# Patient Record
Sex: Female | Born: 1955
Health system: Southern US, Community
[De-identification: ages and names within clinical notes are randomized; demographics above are authoritative.]

## PROBLEM LIST (undated history)

## (undated) DIAGNOSIS — Z8051 Family history of malignant neoplasm of kidney: Secondary | ICD-10-CM

## (undated) DIAGNOSIS — Z803 Family history of malignant neoplasm of breast: Secondary | ICD-10-CM

## (undated) DIAGNOSIS — Z923 Personal history of irradiation: Secondary | ICD-10-CM

## (undated) DIAGNOSIS — E039 Hypothyroidism, unspecified: Secondary | ICD-10-CM

## (undated) DIAGNOSIS — E079 Disorder of thyroid, unspecified: Secondary | ICD-10-CM

## (undated) DIAGNOSIS — T7840XA Allergy, unspecified, initial encounter: Secondary | ICD-10-CM

## (undated) DIAGNOSIS — C50919 Malignant neoplasm of unspecified site of unspecified female breast: Secondary | ICD-10-CM

## (undated) DIAGNOSIS — R011 Cardiac murmur, unspecified: Secondary | ICD-10-CM

## (undated) HISTORY — DX: Allergy, unspecified, initial encounter: T78.40XA

## (undated) HISTORY — DX: Malignant neoplasm of unspecified site of unspecified female breast: C50.919

## (undated) HISTORY — DX: Disorder of thyroid, unspecified: E07.9

## (undated) HISTORY — PX: APPENDECTOMY: SHX54

## (undated) HISTORY — PX: ABDOMINAL HYSTERECTOMY: SHX81

## (undated) HISTORY — PX: NASAL SINUS SURGERY: SHX719

## (undated) HISTORY — PX: OTHER SURGICAL HISTORY: SHX169

## (undated) HISTORY — DX: Family history of malignant neoplasm of kidney: Z80.51

## (undated) HISTORY — PX: TUBAL LIGATION: SHX77

## (undated) HISTORY — DX: Family history of malignant neoplasm of breast: Z80.3

## (undated) HISTORY — PX: TONSILECTOMY, ADENOIDECTOMY, BILATERAL MYRINGOTOMY AND TUBES: SHX2538

---

## 1998-06-06 ENCOUNTER — Other Ambulatory Visit: Admission: RE | Admit: 1998-06-06 | Discharge: 1998-06-06 | Payer: Self-pay | Admitting: Gynecology

## 2000-10-08 ENCOUNTER — Encounter: Admission: RE | Admit: 2000-10-08 | Discharge: 2001-01-06 | Payer: Self-pay | Admitting: Endocrinology

## 2002-04-24 ENCOUNTER — Other Ambulatory Visit: Admission: RE | Admit: 2002-04-24 | Discharge: 2002-04-24 | Payer: Self-pay | Admitting: Gynecology

## 2002-08-17 HISTORY — PX: SHOULDER ARTHROSCOPY: SHX128

## 2002-12-21 ENCOUNTER — Other Ambulatory Visit: Admission: RE | Admit: 2002-12-21 | Discharge: 2002-12-21 | Payer: Self-pay | Admitting: Gynecology

## 2003-08-27 ENCOUNTER — Other Ambulatory Visit: Admission: RE | Admit: 2003-08-27 | Discharge: 2003-08-27 | Payer: Self-pay | Admitting: Gynecology

## 2004-02-13 ENCOUNTER — Observation Stay (HOSPITAL_COMMUNITY): Admission: RE | Admit: 2004-02-13 | Discharge: 2004-02-14 | Payer: Self-pay | Admitting: Gynecology

## 2004-02-13 ENCOUNTER — Encounter (INDEPENDENT_AMBULATORY_CARE_PROVIDER_SITE_OTHER): Payer: Self-pay | Admitting: Specialist

## 2004-10-09 ENCOUNTER — Other Ambulatory Visit: Admission: RE | Admit: 2004-10-09 | Discharge: 2004-10-09 | Payer: Self-pay | Admitting: Gynecology

## 2005-01-29 ENCOUNTER — Ambulatory Visit: Payer: Self-pay | Admitting: Family Medicine

## 2005-05-18 ENCOUNTER — Ambulatory Visit: Payer: Self-pay | Admitting: Family Medicine

## 2005-09-09 ENCOUNTER — Ambulatory Visit: Payer: Self-pay | Admitting: Internal Medicine

## 2005-11-05 ENCOUNTER — Other Ambulatory Visit: Admission: RE | Admit: 2005-11-05 | Discharge: 2005-11-05 | Payer: Self-pay | Admitting: Gynecology

## 2006-02-05 ENCOUNTER — Ambulatory Visit: Payer: Self-pay | Admitting: Family Medicine

## 2006-05-20 ENCOUNTER — Ambulatory Visit: Payer: Self-pay | Admitting: Family Medicine

## 2006-06-02 ENCOUNTER — Ambulatory Visit: Payer: Self-pay | Admitting: Family Medicine

## 2006-10-12 ENCOUNTER — Ambulatory Visit: Payer: Self-pay | Admitting: Family Medicine

## 2006-11-11 ENCOUNTER — Other Ambulatory Visit: Admission: RE | Admit: 2006-11-11 | Discharge: 2006-11-11 | Payer: Self-pay | Admitting: Obstetrics and Gynecology

## 2006-12-28 ENCOUNTER — Ambulatory Visit: Payer: Self-pay | Admitting: Internal Medicine

## 2007-02-21 ENCOUNTER — Ambulatory Visit: Payer: Self-pay | Admitting: Family Medicine

## 2007-02-21 DIAGNOSIS — H60399 Other infective otitis externa, unspecified ear: Secondary | ICD-10-CM | POA: Insufficient documentation

## 2007-02-21 DIAGNOSIS — J309 Allergic rhinitis, unspecified: Secondary | ICD-10-CM | POA: Insufficient documentation

## 2007-02-21 DIAGNOSIS — E039 Hypothyroidism, unspecified: Secondary | ICD-10-CM | POA: Insufficient documentation

## 2007-02-21 DIAGNOSIS — E109 Type 1 diabetes mellitus without complications: Secondary | ICD-10-CM | POA: Insufficient documentation

## 2007-02-21 DIAGNOSIS — J45909 Unspecified asthma, uncomplicated: Secondary | ICD-10-CM | POA: Insufficient documentation

## 2007-03-02 ENCOUNTER — Telehealth (INDEPENDENT_AMBULATORY_CARE_PROVIDER_SITE_OTHER): Payer: Self-pay | Admitting: *Deleted

## 2007-09-20 ENCOUNTER — Ambulatory Visit: Payer: Self-pay | Admitting: Internal Medicine

## 2007-09-20 DIAGNOSIS — R300 Dysuria: Secondary | ICD-10-CM | POA: Insufficient documentation

## 2007-09-20 LAB — CONVERTED CEMR LAB
Bilirubin Urine: NEGATIVE
Blood in Urine, dipstick: NEGATIVE
Glucose, Urine, Semiquant: NEGATIVE
Nitrite: NEGATIVE
Protein, U semiquant: NEGATIVE
Specific Gravity, Urine: <1.005
Urobilinogen, UA: NEGATIVE
WBC Urine, dipstick: NEGATIVE
pH: 7.5

## 2007-09-21 ENCOUNTER — Encounter: Payer: Self-pay | Admitting: Internal Medicine

## 2007-09-22 ENCOUNTER — Telehealth (INDEPENDENT_AMBULATORY_CARE_PROVIDER_SITE_OTHER): Payer: Self-pay | Admitting: *Deleted

## 2007-11-14 ENCOUNTER — Other Ambulatory Visit: Admission: RE | Admit: 2007-11-14 | Discharge: 2007-11-14 | Payer: Self-pay | Admitting: Obstetrics and Gynecology

## 2008-03-09 ENCOUNTER — Encounter: Payer: Self-pay | Admitting: Family Medicine

## 2008-11-14 ENCOUNTER — Other Ambulatory Visit: Admission: RE | Admit: 2008-11-14 | Discharge: 2008-11-14 | Payer: Self-pay | Admitting: Obstetrics and Gynecology

## 2009-09-30 DIAGNOSIS — J45909 Unspecified asthma, uncomplicated: Secondary | ICD-10-CM | POA: Insufficient documentation

## 2009-11-20 ENCOUNTER — Other Ambulatory Visit: Admission: RE | Admit: 2009-11-20 | Discharge: 2009-11-20 | Payer: Self-pay | Admitting: Obstetrics and Gynecology

## 2010-02-06 ENCOUNTER — Ambulatory Visit: Payer: Self-pay | Admitting: Family Medicine

## 2010-02-06 DIAGNOSIS — B373 Candidiasis of vulva and vagina: Secondary | ICD-10-CM | POA: Insufficient documentation

## 2010-02-06 DIAGNOSIS — N760 Acute vaginitis: Secondary | ICD-10-CM | POA: Insufficient documentation

## 2010-02-06 LAB — CONVERTED CEMR LAB
Bilirubin Urine: NEGATIVE
Blood in Urine, dipstick: NEGATIVE
Glucose, Urine, Semiquant: NEGATIVE
KOH Prep: NEGATIVE
Ketones, urine, test strip: NEGATIVE
Nitrite: NEGATIVE
Protein, U semiquant: NEGATIVE
Specific Gravity, Urine: 1.015
Urobilinogen, UA: 0.2
WBC Urine, dipstick: NEGATIVE
Whiff Test: NEGATIVE
pH: 6.5

## 2010-05-01 ENCOUNTER — Encounter: Admission: RE | Admit: 2010-05-01 | Discharge: 2010-05-01 | Payer: Self-pay | Admitting: Orthopedic Surgery

## 2010-09-18 NOTE — Assessment & Plan Note (Signed)
Summary: left ear ache/alj   Vital Signs:  Patient Profile:   55 Years Old Female Weight:      130.4 pounds Temp:     97.8 degrees F oral Pulse rate:   72 / minute BP sitting:   118 / 70  (left arm)               PCP:  [facility]  Chief Complaint:  LEFT EARACHE SINCE FRIDAY (02/18/2007/) and Ear pain.  History of Present Illness:  Ear Pain      This is a 55 year old woman who presents with Ear pain.  Pt here c/o L ear pain since Friday.  The patient denies ear discharge, sensation of fullness, hearing loss, tinnitus, fever, sinus pain, nasal discharge, and jaw click.  The pain is located in the left ear.  The pain is described as constant.  Associated symptoms include headache and night grinding of teeth.  The patient denies toothache, dizziness, and vertigo.      Past Medical History:    Allergic rhinitis    Asthma    Diabetes mellitus, type I    Hypothyroidism     Review of Systems      See HPI  General      Denies chills, fatigue, fever, loss of appetite, malaise, sleep disorder, sweats, weakness, and weight loss.  ENT      Complains of earache.      Denies decreased hearing, difficulty swallowing, ear discharge, hoarseness, nasal congestion, nosebleeds, postnasal drainage, ringing in ears, sinus pressure, and sore throat.  CV      Denies bluish discoloration of lips or nails, chest pain or discomfort, difficulty breathing at night, difficulty breathing while lying down, fainting, fatigue, leg cramps with exertion, lightheadness, near fainting, palpitations, shortness of breath with exertion, swelling of feet, swelling of hands, and weight gain.  Resp      Denies chest discomfort, chest pain with inspiration, cough, coughing up blood, excessive snoring, hypersomnolence, morning headaches, pleuritic, shortness of breath, sputum productive, and wheezing.   Physical Exam  General:     Well-developed,well-nourished,in no acute distress; alert,appropriate and  cooperative throughout examination Ears:     L ear canal red  TM clear R ear normal Lungs:     Normal respiratory effort, chest expands symmetrically. Lungs are clear to auscultation, no crackles or wheezes. Heart:     normal rate, regular rhythm, and no murmur.      Impression & Recommendations:  Problem # 1:  EXTERNAL OTITIS (ICD-380.10) Discussed symptomatic treatment and preventive measures.  Her updated medication list for this problem includes:    Floxin Otic Singles 0.3 % Soln (Ofloxacin) .Marland KitchenMarland KitchenMarland KitchenMarland Kitchen 10 gtts in left ear once daily for 7- 10 days RTo prn  Medications Added to Medication List This Visit: 1)  Adult Aspirin Ec Low Strength 81 Mg Tbec (Aspirin) .[name].. 1 once daily 2)  Synthroid 150 Mcg Tabs (Levothyroxine sodium) .[name].. 1 once daily 3)  Vivelle-dot 0.075 Mg/24hr Pttw (Estradiol) 4)  Flonase 50 Mcg/act Susp (Fluticasone propionate) .... As needed 5)  Albuterol 90 Mcg/act Aers (Albuterol) .... As needed 6)  Novolog Pump  7)  Floxin Otic Singles 0.3 % Soln (Ofloxacin) .[name].. 10 gtts in left ear once daily for 7- 10 days     Prescriptions: FLOXIN OTIC SINGLES 0.3 %  SOLN (OFLOXACIN) 10 gtts in Left ear once daily for 7- 10 days  #10 days x 0   Entered and Authorized by:   Loreen Freud  DO   Signed by:   Loreen Freud DO on 02/21/2007   Method used:   Print then Give to Patient   RxID:   [id number]

## 2010-09-18 NOTE — Progress Notes (Signed)
Summary: not better-EAR PAIN  Phone Note Call from Patient   Caller: Patient Reason for Call: Acute Illness, Talk to Nurse Summary of Call: dr. Laury Axon [phone] [facility] pt was seen 7.7 for an ear ache. she is still having problems she was told to call and see if she can get an axb called into the pharmacy  Initial call taken by: Charolette Child,  March 02, 2007 10:56 AM  Follow-up for Phone Call        PT SAYS SHE WAS TOLD TO CALL IF KNOW BETTER SHE IS STILL EXPERIENCING PRBLEMS W/ HER EAR. CDAVIS  Additional Follow-up for Phone Call Additional follow up Details #1::        Augmentin 875 two times a day  for 10 days  #20 Additional Follow-up by: Loreen Freud DO,  March 02, 2007 12:00 PM    New/Updated Medications: AUGMENTIN 875-125 MG  TABS (AMOXICILLIN-POT CLAVULANATE) TWO TIME DAILY A DAY FOR 10 DAY  Prescriptions: AUGMENTIN 875-125 MG  TABS (AMOXICILLIN-POT CLAVULANATE) TWO TIME DAILY A DAY FOR 10 DAY  #20 x 0   Entered by:   Daine Gip   Authorized by:   Loreen Freud DO   Signed by:   Daine Gip on 03/02/2007   Method used:   Print then Give to Patient   RxID:   [id number]

## 2010-09-18 NOTE — Progress Notes (Signed)
Summary: PHONE CALL: CULTURE RESULTS  Phone Note Outgoing Call   Call placed by: Shonna Chock,  September 22, 2007 2:23 PM Call placed to: Patient Details for Reason: DISCUSS CULTURE Summary of Call: LEFT MESSAGE ON MACHINE, REASON FOR CALL WAS TO TELL PATIENT HER CULTURE WAS NEG, DR.HOPPER RECOMMENDS DRINKING PLENTY OF FLUIDS AND MEDICATION PRESCRIBED FOR UTI CAN BE STOPPED. ..................................................................Marland KitchenChrae Malloy  September 22, 2007 2:25 PM   Follow-up for Phone Call        LEFT MESSAGE ON MACHINE Follow-up by: Shonna Chock,  September 23, 2007 12:33 PM  Additional Follow-up for Phone Call Additional follow up Details #1::        D/W PATIENT, PATIENT OK'D INSTRUCTION. Additional Follow-up by: Shonna Chock,  September 23, 2007 4:56 PM

## 2010-09-18 NOTE — Assessment & Plan Note (Signed)
Summary: acute only:uti//alj   Vital Signs:  Patient Profile:   55 Years Old Female Weight:      132.13 pounds Temp:     97.0 degrees F oral Pulse rate:   58 / minute Pulse rhythm:   regular Resp:     12 per minute BP sitting:   122 / 62  (left arm) Cuff size:   large  Pt. in pain?   no  Vitals Entered By: Wendall Stade (September 20, 2007 3:02 PM)                  PCP:  Laury Axon  Chief Complaint:  painful urination and Dysuria.  History of Present Illness: pain with urination  Dysuria; Rx: OTC Azo with minimal benefit      This is a 55 year old woman who presents with Dysuria.  PMH TAH & BSO for abnormal PAP. She sees Dr Evlyn Kanner; A1c was 6.8 % in 1/09.  The patient reports burning with urination and urinary frequency, but denies urgency, hematuria, vaginal discharge, vaginal itching, and vaginal sores.  Associated symptoms include pelvic pain.  The patient denies the following associated symptoms: nausea, vomiting, fever, shaking chills, flank pain, abdominal pain, back pain, and arthralgias.  Risk factors for urinary tract infection include diabetes.  The patient denies the following risk factors: prior antibiotics, immunosuppression, history of GU anomaly, history of pyelonephritis, and analgesic abuse.    Current Allergies (reviewed today): No known allergies       Physical Exam  General:     Well-developed,well-nourished,in no acute distress; alert,appropriate and cooperative throughout examination Mouth:     Oral mucosa and oropharynx without lesions or exudates.  Teeth in good repair. Abdomen:     Bowel sounds positive,abdomen soft and non-tender without masses, organomegaly or hernias noted. Insulin pump in place. Minimal discomfort to percussion L flank Skin:     Intact without suspicious lesions or rashes. No tenting    Impression & Recommendations:  Problem # 1:  DYSURIA (ICD-788.1)  Orders: UA Dipstick w/o Micro (32440) T-Culture, Urine  ([id number])  Her updated medication list for this problem includes:    Macrobid 100 Mg Caps (Nitrofurantoin monohyd macro) .[name]... 1 two times a day    Phenazopyridine Hcl 200 Mg Tabs (Phenazopyridine hcl) .[name]... 1 three times a day prn   Problem # 2:  DIABETES MELLITUS, TYPE I (ICD-250.01)  Her updated medication list for this problem includes:    Adult Aspirin Ec Low Strength 81 Mg Tbec (Aspirin) .[name]... 1 once daily   Complete Medication List: 1)  Adult Aspirin Ec Low Strength 81 Mg Tbec (Aspirin) .[name].. 1 once daily 2)  Synthroid 150 Mcg Tabs (Levothyroxine sodium) .[name].. 1 once daily 3)  Vivelle-dot 0.075 Mg/24hr Pttw (Estradiol) 4)  Novolog Pump  5)  Macrobid 100 Mg Caps (Nitrofurantoin monohyd macro) .[name].. 1 two times a day 6)  Phenazopyridine Hcl 200 Mg Tabs (Phenazopyridine hcl) .[name].. 1 three times a day prn   Patient Instructions: 1)  Drink as much fluid as you can tolerate for the next few days.    Prescriptions: PHENAZOPYRIDINE HCL 200 MG  TABS (PHENAZOPYRIDINE HCL) 1 three times a day prn  #15 x 0   Entered and Authorized by:   Marga Melnick MD   Signed by:   Marga Melnick MD on 09/20/2007   Method used:   Print then Give to Patient   RxID:   [id number] MACROBID 100 MG  CAPS (NITROFURANTOIN MONOHYD MACRO) 1  two times a day  #20 x 0   Entered and Authorized by:   Marga Melnick MD   Signed by:   Marga Melnick MD on 09/20/2007   Method used:   Print then Give to Patient   RxID:   [id number]  ] Laboratory Results   Urine Tests  Date/Time Recieved: September 20, 2007 3:10 PM   Routine Urinalysis   Color: straw Appearance: Clear Glucose: negative   (Normal Range: Negative) Bilirubin: negative   (Normal Range: Negative) Ketone: moderate (40)   (Normal Range: Negative) Spec. Gravity: <1.005   (Normal Range: 1.003-1.035) Blood: negative   (Normal Range: Negative) pH: 7.5   (Normal Range: 5.0-8.0) Protein: negative   (Normal Range:  Negative) Urobilinogen: negative   (Normal Range: 0-1) Nitrite: negative   (Normal Range: Negative) Leukocyte Esterace: negative   (Normal Range: Negative)    Comments: dysuria sent for culture 599.0

## 2010-09-18 NOTE — Assessment & Plan Note (Signed)
Summary: YEAST INF--OTC NOT WORKING---OK PER DANIELLE////SPH   Vital Signs:  Patient profile:   55 year old female Height:      64 inches Weight:      118.25 pounds BMI:     20.37 Pulse rate:   76 / minute Pulse rhythm:   regular BP sitting:   118 / 80  (left arm) Cuff size:   regular  Vitals Entered By: Army Fossa CMA (February 06, 2010 11:26 AM) CC: Pt here for possible yeast infection off and on for 1 month- has tried Engineer, materials not working. No discharge. Burns after urination, Vaginal discharge   History of Present Illness:  Vaginal discharge      This is a 55 year old woman who presents with Vaginal discharge.  Pt actually did not see d/c--- c/o vaginal burning and itchint.  The patient complains of itching and burning on urination, but denies vaginal burning, frequency, urgency, fever, pelvic pain, and back pain.  The discharge is described as white.  Risk factors for vaginal discharge include diabetes.  The patient denies the following symptoms: genital sores, unusual vaginal bleeding, painful intercourse, rash, myalgias, arthralgias, and headache.  Prior treatment has included OTC antifungals.    Current Medications (verified): 1)  Adult Aspirin Ec Low Strength 81 Mg  Tbec (Aspirin) .[name].. 1 Once Daily 2)  Synthroid 150 Mcg  Tabs (Levothyroxine Sodium) .[name].. 1 Once Daily 3)  Vivelle-Dot 0.075 Mg/24hr  Pttw (Estradiol) 4)  Novolog Pump 5)  Claritin 10 Mg Tabs (Loratadine) 6)  Zyrtec Hives Relief 10 Mg Tabs (Cetirizine Hcl) 7)  Fluconazole 150 Mg Tabs (Fluconazole) .[name].. 1 By Mouth Once Daily X1--May Repeat in 3 Days As Needed 8)  Metrogel-Vaginal 0.75 % Gel (Metronidazole) .[name].. 1 Applicator Pv At Bedtime For 5 Nights  Allergies (verified): No Known Drug Allergies  Past History:  Past medical, surgical, family and social histories (including risk factors) reviewed for relevance to current acute and chronic problems.  Past Medical History: Reviewed history from 02/21/2007 and  no changes required. Allergic rhinitis Asthma Diabetes mellitus, type I Hypothyroidism  Family History: Reviewed history and no changes required.  Social History: Reviewed history and no changes required.  Review of Systems      See HPI  Physical Exam  General:  Well-developed,well-nourished,in no acute distress; alert,appropriate and cooperative throughout examination Abdomen:  Bowel sounds positive,abdomen soft and non-tender without masses, organomegaly or hernias noted. Genitalia:  normal introitus, no external lesions, no vaginal or cervical lesions, and vaginal discharge.   no odor thin white d/c Psych:  Oriented X3 and normally interactive.     Impression & Recommendations:  Problem # 1:  VAGINITIS, BACTERIAL (ICD-616.10)  The following medications were removed from the medication list:    Macrobid 100 Mg Caps (Nitrofurantoin monohyd macro) .[name]... 1 two times a day Her updated medication list for this problem includes:    Metrogel-vaginal 0.75 % Gel (Metronidazole) .[name]... 1 applicator pv at bedtime for 5 nights  Discussed symptomatic relief and treatment options.   Orders: Wet Prep ([id number])  Problem # 2:  Hx of VAGINITIS, CANDIDAL (ICD-112.1)  Her updated medication list for this problem includes:    Fluconazole 150 Mg Tabs (Fluconazole) .[name]... 1 by mouth once daily x1--may repeat in 3 days as needed  Discussed treatment regimen and preventive measures.   Orders: Wet Prep ([id number])  Complete Medication List: 1)  Adult Aspirin Ec Low Strength 81 Mg Tbec (Aspirin) .[name].. 1 once daily 2)  Synthroid 150  Mcg Tabs (Levothyroxine sodium) .[name].. 1 once daily 3)  Vivelle-dot 0.075 Mg/24hr Pttw (Estradiol) 4)  Novolog Pump  5)  Claritin 10 Mg Tabs (Loratadine) 6)  Zyrtec Hives Relief 10 Mg Tabs (Cetirizine hcl) 7)  Fluconazole 150 Mg Tabs (Fluconazole) .[name].. 1 by mouth once daily x1--may repeat in 3 days as needed 8)  Metrogel-vaginal 0.75 % Gel (Metronidazole) .[name].. 1  applicator pv at bedtime for 5 nights  Other Orders: UA Dipstick w/o Micro (manual) (01027) Prescriptions: METROGEL-VAGINAL 0.75 % GEL (METRONIDAZOLE) 1 applicator pv at bedtime for 5 nights  #5 x 0   Entered and Authorized by:   Loreen Freud DO   Signed by:   Loreen Freud DO on 02/06/2010   Method used:   Electronically to        [facility] [phone]* (retail)       [address] [place], [place]  [address]       Ph: [phone] or [phone]       Fax: [phone]   RxID:   [id number] FLUCONAZOLE 150 MG TABS (FLUCONAZOLE) 1 by mouth once daily x1--may repeat in 3 days as needed  #2 x 2   Entered and Authorized by:   Loreen Freud DO   Signed by:   Loreen Freud DO on 02/06/2010   Method used:   Print then Give to Patient   RxID:   [id number]   Laboratory Results   Urine Tests    Routine Urinalysis   Color: yellow Appearance: Clear Glucose: negative   (Normal Range: Negative) Bilirubin: negative   (Normal Range: Negative) Ketone: negative   (Normal Range: Negative) Spec. Gravity: 1.015   (Normal Range: 1.003-1.035) Blood: negative   (Normal Range: Negative) pH: 6.5   (Normal Range: 5.0-8.0) Protein: negative   (Normal Range: Negative) Urobilinogen: 0.2   (Normal Range: 0-1) Nitrite: negative   (Normal Range: Negative) Leukocyte Esterace: negative   (Normal Range: Negative)    Comments: Army Fossa CMA  February 06, 2010 11:53 AM    Wet [place] Source: vaginal  WBC/hpf: 5-10 Bacteria/hpf: 2+ Clue cells/hpf: few  Negative whiff Yeast/hpf: none Wet [place] KOH: Negative Trichomonas/hpf: none

## 2010-09-18 NOTE — Procedures (Signed)
Summary: Colonoscopy/[facility]  Colonoscopy/[facility]   Imported By: Lanelle Bal 03/22/2008 12:59:00  _____________________________________________________________________  External Attachment:    Type:   Image     Comment:   External Document

## 2011-01-02 NOTE — Op Note (Signed)
NAME:  Tiffany Browning, Tiffany Browning                        ACCOUNT NO.:  [id number]   MEDICAL RECORD NO.:  [id number]                   PATIENT TYPE:  OBV   LOCATION:  0623                                 FACILITY:  [facility]   PHYSICIAN:  Leona Singleton, M.D.                  DATE OF BIRTH:  [birth year]   DATE OF PROCEDURE:  02/13/2004  DATE OF DISCHARGE:                                 OPERATIVE REPORT   PREOPERATIVE DIAGNOSIS:  Left ovarian cyst.   POSTOPERATIVE DIAGNOSIS:  Left ovarian cyst plus adhesions.   OPERATION PERFORMED:  Super cervical hysterectomy with bilateral salpingo-  oophorectomy and lysis of adhesions.   SURGEON:  Leatha Gilding. Mezer, M.D.   ASSISTANT:  Leona Singleton, M.D.   STAND-BY SURGEON:  De Blanch, M.D.   ANESTHESIA:  General endotracheal.   PREPARATION:  Betadine.   PROCEDURE:  With the patient in the supine position, he was prepped and  draped in a routine fashion.  A Pfannenstiel incision was made through the  skin and subcutaneous tissue.  The fascia and peritoneum were opened without  difficulty.  There was significant scarring of the rectus muscle to the  posterior fascia and very careful attention was paid to hemostasis in both  entering and exiting the abdomen.  A brief exploration of her upper abdomen  was benign.  There were adhesions of omentum to the anterior abdominal wall,  at the superior aspect of the incision, and these adhesions were taken down  with cautery and hemostasis carefully checked.  Attention was then directed  to the pelvis.  Pelvic washings were obtained upon entering the peritoneal  cavity, which were later discarded after the benign pathology report.  The  uterus was top-normal in size.  The left ovary contained an approximately 5  cm cyst.  The right ovary was normal.  There were adhesions of bowel  extending from the left round ligament to the left infundibulopelvic  ligament.  These adhesions were quite dense and very  vascular.  Normal  anatomy was restored by lysis of the adhesions just mentioned and taken down  in layers, using cautery and scissors, as indicated.  Careful attention to  hemostasis was paid.  The ureter was identified and taken out of harm's way.  The round ligaments were suture-ligated with #1 chromic and divided.  The  anterior leaflet of the broad ligament was opened, and the bladder taken  down.  The bladder was reasonably densely adherent to the lower part of the  uterus and cervix, and this was primarily done with scissors and no apparent  injury.  The infundibulopelvic ligament was isolated on the left.  The  ureter re identified.  The pedicle clamped, cut, and freed tied with #1  chromic and suture-ligated with #1 chromic.  The tube and ovary were then  separated from the uterus and then sent to pathology for frozen section  analysis.  A verbal report of the frozen section revealed a benign ovarian  cyst and a probable endometrioma.  The right infundibulopelvic ligament was  clamped, cut, and free-tied with a #1 chromic and suture-ligated with a #1  chromic.  The uterine artery and veins were then clamped, cut, and suture-  ligated with #1 chromic.  The cardinal ligament was taken and separately  clamped, cut, and suture-ligated with #1 chromic.  The uterus was then  amputated from the cervix with cautery.  The base of the cervix was  cauterized.  The endocervical canal cauterized.  The stomach was then closed  over with interrupted figure-of-eight #1 chromic sutures.  Care was taken to  avoid the bladder.  There was some oozing at the base of the bladder, which  was arrested with very careful cautery.  The pelvis was irrigated.  Hemostasis noted to be intact, and the bladder was then placed over the  cervical stump with a running 3-0 Vicryl suture.  The pelvis was rewashed.  Hemostasis again noted to be intact.  Both the ureters were re identified  and found to be out of harm's  way, peristalsing and not dilated.  At the  completion of the procedure, an effort was made to place the large bowel in  the cul de sac.  The omentum was brought down.  The abdomen was closed in  layers using a running 2-0 Vicryl on the peritoneum.  Again, careful  attention was paid to hemostasis in the subfascial area.  The fascia was  closed with running 0 Vicryl to midline bilaterally.  Hemostasis was assured  in the subcutaneous tissue, and the skin was closed with staples.  The  estimated blood loss was approximately 100 cc.  The sponge, instrument, and  needle counts were correct x2.  Patient tolerated the procedure well and was  taken to the recovery room in satisfactory condition.                                               Leona Singleton, M.D.    KB/MEDQ  D:  02/13/2004  T:  02/13/2004  Job:  [id number]   cc:   Jeannett Senior A. Evlyn Kanner, M.D.  [address]  [place]  [place] [address]  Fax: [phone]   De Blanch, M.D.

## 2011-01-02 NOTE — H&P (Signed)
NAME:  Tiffany Browning, Tiffany Browning                        ACCOUNT NO.:  [id number]   MEDICAL RECORD NO.:  [id number]                   PATIENT TYPE:  AMB   LOCATION:  DAY                                  FACILITY:  [facility]   PHYSICIAN:  Howard C. Mezer, M.D.               DATE OF BIRTH:  1956/07/26   DATE OF ADMISSION:  02/13/2004  DATE OF DISCHARGE:                                HISTORY & PHYSICAL   ADMITTING DIAGNOSIS:  Left ovarian cyst.   HISTORY OF PRESENT ILLNESS:  The patient is a 55 year old nulligravida  female admitted with a persistent, somewhat complex left ovarian cyst for  exploratory laparotomy and total abdominal hysterectomy and bilateral  salpingo-oophorectomy.  The patient underwent a CT scan at the direction of  Dr. Adrian Prince for lower leg edema, which has apparently resolved, in  December of 2004.  At that time, there was a right adnexal cystic structure,  most likely representing an ovarian cyst, and pelvic ultrasound was  suggested.  After multiple attempts in rescheduling by the patient,  ultrasound examination was finally performed on December 13, 2003.  At that  time, a left ovarian cyst measuring 58.5 x 43.7 mm with some complexity was  found.  A CA125 was obtained, which was returned as 9.0, and the patient was  placed on torsion precautions.  The ultrasound examination was repeated on  January 22, 2004, at which time the cyst appeared to be larger, at 63.7 x 47.1  mm, and the complexity of the cyst appeared to have increased to some  degree.  The need for exploratory laparotomy to rule out cancer was  discussed with the patient and her husband.  Exploratory laparotomy was  scheduled for February 13, 2004.  At the preoperative visit on February 07, 2004,  the ultrasound revealed the cyst to be somewhat smaller, measuring 54.0 x  38.5 mm, and the cyst appeared to be much more simple by ultrasound  evaluation.  At this point, the patient was encouraged to consider delaying  the  surgery to allow further observation of this cyst, given the possibility  that this might resolve spontaneously, and the surgery not be required.  The  patient was asked that if a complication of problem occurred during the  surgery, and the cyst was benign, would the patient still feel that the  surgery was the correct choice.  The patient strongly wished to proceed with  surgery, even though it appeared at this point that the cyst is more likely  to be benign, and could resolve without surgery.  The patient understands  that her risks of the surgery are increased secondary to her diabetes.  The  patient was seen at 2 different times during the day of the preoperative  examination, to give her time to carefully consider the choices, and after  careful reflection and consultation with family members, she has decided to  proceed with  the surgery, and excepts all the risks that are outlined in the  history and physical.  Exploratory laparotomy with total abdominal  hysterectomy and bilateral salpingo-oophorectomy were discussed with the  patient in detail.  The patient felt strongly that she wanted to have the  uterus and both ovaries removed to reduce the chance for problems in the  future.  The possibility of leaving the cervix and possibly decreasing the  chance for a postoperative infection was discussed.  Dr. Katheren Shams-  Pearson will be available at a standby surgery if cancer is encountered, and  he will perform the indicated procedures.   The potential complications including, but not limited to, anesthesia,  injury to the bowel, bladder, ureters, possible fistula formation, possible  blood loss with transfusion and its sequelae, and possible infection in the  pelvis or the wound have been discussed.  Several times during the  discussion, the increased risks secondary to the diabetes were reviewed.  The patient understands that with hysterectomy that she will have no further   menstrual periods, and that she will have permanent sterilization.  Postoperative expectations and restrictions have been reviewed in detail.  Pain control has been reviewed.  Bowel prep with GoLYTELY has been  discussed, and the patient will contact Dr. Evlyn Kanner to discuss her insulin  management during the preoperative time.   The patient appears to clearly understands the risks and benefits and  alternatives of the proposed surgery, and wishes to proceed as above.   PAST MEDICAL HISTORY:  1. Surgical T&A.  2. Appendectomy.  3. Tubal ligation.  4. Microscopic tubal anastomosis.  5. Ovarian cystectomy.  6. Nose.   MEDICAL:  Diabetes and hypothyroid.   MEDICATIONS:  1. Synthroid.  2. Pravachol.  3. Zetia.  4. Captopril.  5. Insulin pump.   ALLERGIES:  None known.   SOCIAL HISTORY:  Smokes - none.  ETOH - occasional.  The patient is married,  and is employed at [employer].   FAMILY HISTORY:  Positive for a question of a cancer in the patient's  mother, possibly uterine, possibly ovarian, and the patient states that to  her knowledge there was no postoperative treatment, raising the possibility  of a pre-malignant lesion.   PHYSICAL EXAMINATION:  HEENT:  Negative.  LUNGS:  Clear.  HEART:  Without murmurs.  BREASTS:  Without masses or discharge.  ABDOMEN:  Soft and nontender.  PELVIC:  Cervix and vagina normal.  The uterus is anterior, normal in size.  The right adnexa is negative.  The left ovary contains a cystic mass, and is  in the cul-de-sac.  It appears to be mobile and without nodularity on  examination.  EXTREMITIES:  Negative.   Repeat CA125 returned at 10.8, with an intermittent level of 13.4 on January 22, 2004.  The patient has been informed of the minor decrease in the CA125 that  was drawn preoperatively.   IMPRESSION:  Left ovarian cyst.   PLAN:  Exploratory laparotomy with hysterectomy and bilateral salpingo-  oophorectomy.                                               Leatha Gilding. Mezer, M.D.    HCM/MEDQ  D:  02/12/2004  T:  02/12/2004  Job:  [id number]   cc:   Jeannett Senior A. Evlyn Kanner, M.D.  [address]  [place]  [place] [address]  Fax: [phone]   [place], M.D.

## 2011-01-26 ENCOUNTER — Ambulatory Visit (INDEPENDENT_AMBULATORY_CARE_PROVIDER_SITE_OTHER): Payer: BC Managed Care – PPO | Admitting: Family Medicine

## 2011-01-26 ENCOUNTER — Encounter: Payer: Self-pay | Admitting: Family Medicine

## 2011-01-26 VITALS — BP 144/74 | HR 60 | Temp 98.1°F | Ht 61.25 in | Wt 126.6 lb

## 2011-01-26 DIAGNOSIS — Z136 Encounter for screening for cardiovascular disorders: Secondary | ICD-10-CM

## 2011-01-26 DIAGNOSIS — Z0000 Encounter for general adult medical examination without abnormal findings: Secondary | ICD-10-CM

## 2011-01-26 LAB — CBC WITH DIFFERENTIAL/PLATELET
Basophils Absolute: 0.1 10*3/uL (ref 0.0–0.1)
Basophils Relative: 1.5 % (ref 0.0–3.0)
Eosinophils Absolute: 0.3 10*3/uL (ref 0.0–0.7)
Eosinophils Relative: 7.2 % — ABNORMAL HIGH (ref 0.0–5.0)
HCT: 36.3 % (ref 36.0–46.0)
Hemoglobin: 12.4 g/dL (ref 12.0–15.0)
Lymphocytes Relative: 30.5 % (ref 12.0–46.0)
Lymphs Abs: 1.4 10*3/uL (ref 0.7–4.0)
MCHC: 34.2 g/dL (ref 30.0–36.0)
MCV: 95.6 fl (ref 78.0–100.0)
Monocytes Absolute: 0.5 10*3/uL (ref 0.1–1.0)
Monocytes Relative: 10.9 % (ref 3.0–12.0)
Neutro Abs: 2.3 10*3/uL (ref 1.4–7.7)
Neutrophils Relative %: 49.9 % (ref 43.0–77.0)
Platelets: 300 10*3/uL (ref 150.0–400.0)
RBC: 3.8 Mil/uL — ABNORMAL LOW (ref 3.87–5.11)
RDW: 13 % (ref 11.5–14.6)
WBC: 4.6 10*3/uL (ref 4.5–10.5)

## 2011-01-26 LAB — POCT URINALYSIS DIPSTICK
Bilirubin, UA: NEGATIVE
Blood, UA: NEGATIVE
Glucose, UA: NEGATIVE
Ketones, UA: NEGATIVE
Leukocytes, UA: NEGATIVE
Nitrite, UA: NEGATIVE
Protein, UA: NEGATIVE
Spec Grav, UA: 1.005
Urobilinogen, UA: 0.2
pH, UA: 7

## 2011-01-26 NOTE — Patient Instructions (Signed)

## 2011-01-26 NOTE — Progress Notes (Signed)
  Subjective:     Tiffany Browning is a 55 y.o. female and is here for a comprehensive physical exam. The patient reports no problems.  History   Social History  . Marital Status: Married    Spouse Name: N/A    Number of Children: N/A  . Years of Education: N/A   Occupational History  . 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 Smoking status: Never Smoker   . Smokeless tobacco: Never Used  . Alcohol Use: No  . Drug Use: No  . Sexually Active: Yes -- Female partner(s)   Other Topics Concern  . Not on file   Social History Narrative  . No narrative on file   Health Maintenance  Topic Date Due  . Tetanus/tdap  05/29/1976  . Influenza Vaccine  05/18/2011  . Mammogram  09/27/2012  . Pap Smear  11/25/2013  . Colonoscopy  02/24/2018    The following portions of the patient's history were reviewed and updated as appropriate: allergies, current medications, past family history, past medical history, past social history, past surgical history and problem list.  Review of Systems Review of Systems  Constitutional: Negative for activity change, appetite change and fatigue.  HENT: Negative for hearing loss, congestion, tinnitus and ear discharge.  dentist q75m Eyes: Negative for visual disturbance (see optho q1y -- vision corrected to 20/20 with glasses).  Respiratory: Negative for cough, chest tightness and shortness of breath.   Cardiovascular: Negative for chest pain, palpitations and leg swelling.  Gastrointestinal: Negative for abdominal pain, diarrhea, constipation and abdominal distention.  Genitourinary: Negative for urgency, frequency, decreased urine volume and difficulty urinating.  Musculoskeletal: Negative for back pain, arthralgias and gait problem.  Skin: Negative for color change, pallor and rash.  Neurological: Negative for dizziness, light-headedness, numbness and headaches.  Hematological: Negative for adenopathy. Does not bruise/bleed  easily.  Psychiatric/Behavioral: Negative for suicidal ideas, confusion, sleep disturbance, self-injury, dysphoric mood, decreased concentration and agitation.       Objective:    BP 144/74  Pulse 60  Temp(Src) 98.1 F (36.7 C) (Oral)  Ht 5' 1.25" (1.556 m)  Wt 126 lb 9.6 oz (57.425 kg)  BMI 23.73 kg/m2  SpO2 97% General appearance: alert, cooperative, appears stated age and no distress Head: Normocephalic, without obvious abnormality, atraumatic Eyes: conjunctivae/corneas clear. PERRL, EOM's intact. Fundi benign. Ears: normal TM's and external ear canals both ears Nose: Nares normal. Septum midline. Mucosa normal. No drainage or sinus tenderness. Throat: lips, mucosa, and tongue normal; teeth and gums normal Neck: no adenopathy, no carotid bruit, no JVD, supple, symmetrical, trachea midline and thyroid not enlarged, symmetric, no tenderness/mass/nodules Lungs: clear to auscultation bilaterally Breasts: gyn Heart: regular rate and rhythm, S1, S2 normal, no murmur, click, rub or gallop Abdomen: soft, non-tender; bowel sounds normal; no masses,  no organomegaly Pelvic: gyn Extremities: extremities normal, atraumatic, no cyanosis or edema Pulses: 2+ and symmetric Skin: Skin color, texture, turgor normal. No rashes or lesions Lymph nodes: Cervical, supraclavicular, and axillary nodes normal. Neurologic: Grossly normal psych--no depression or anxiety    Assessment:    Healthy female exam.  DM Hypothyroid allergies Plan:    lipids, a1c etc done by Dr South--endocrine Check cbcd, and ua today rto 1 year or sooner prn Pt will get tetanus from [facility] See After Visit Summary for Counseling Recommendations

## 2011-08-19 ENCOUNTER — Encounter: Payer: Self-pay | Admitting: Family Medicine

## 2011-08-19 ENCOUNTER — Ambulatory Visit (INDEPENDENT_AMBULATORY_CARE_PROVIDER_SITE_OTHER): Payer: 59 | Admitting: Family Medicine

## 2011-08-19 VITALS — BP 114/62 | HR 62 | Temp 97.8°F | Wt 129.0 lb

## 2011-08-19 DIAGNOSIS — H6500 Acute serous otitis media, unspecified ear: Secondary | ICD-10-CM

## 2011-08-19 MED ORDER — MOMETASONE FUROATE 50 MCG/ACT NA SUSP: 2.0000 | Freq: Every day | NASAL | Status: DC

## 2011-08-19 NOTE — Progress Notes (Signed)
  Subjective:     Tiffany Browning is a 56 y.o. female who presents for evaluation of left ear pain. Symptoms have been present for a few days.. She also notes no other symptoms. She does not have a history of ear infections. She does not have a history of recent swimming.  Pmh, sochx, fam hx reviewed   Review of Systems Review of Systems  Constitutional: Negative for activity change, appetite change and fatigue.  HENT: Negative for hearing loss, congestion, tinnitus and ear discharge.  + L ear muffled Eyes: Negative for visual disturbance  Respiratory: Negative for cough, chest tightness and shortness of breath.   Cardiovascular: Negative for chest pain, palpitations and leg swelling.  Gastrointestinal: Negative for abdominal pain, diarrhea, constipation and abdominal distention.  Genitourinary: Negative for urgency, frequency, decreased urine volume and difficulty urinating.  Musculoskeletal: Negative for back pain, arthralgias and gait problem.  Skin: Negative for color change, pallor and rash.  Neurological: Negative for dizziness, light-headedness, numbness and headaches.  Hematological: Negative for adenopathy. Does not bruise/bleed easily.  Psychiatric/Behavioral: Negative for suicidal ideas, confusion, sleep disturbance, self-injury, dysphoric mood, decreased concentration and agitation.       Objective:    BP 114/62  Pulse 62  Temp(Src) 97.8 F (36.6 C) (Oral)  Wt 129 lb (58.514 kg)  SpO2 98% General:  AAOx3  NAD  Right Ear: normal  Left Ear: + R TM dull---+ fluid  Mouth:  + PNDm  No errythematous  Neck: + adenoapathy       Assessment:    L serous otitis   Plan:    Treatment: sudafed, antihistamine,  nasonex OTC analgesia as needed. Water exclusion from affected ear until symptoms resolve. Follow up in a few daysif symptoms not improving.

## 2011-08-19 NOTE — Patient Instructions (Signed)
Serous Otitis Media   Serous otitis media is also known as otitis media with effusion (OME). It means there is fluid in the middle ear space. This space contains the bones for hearing and air. Air in the middle ear space helps to transmit sound.   The air gets there through the eustachian tube. This tube goes from the back of the throat to the middle ear space. It keeps the pressure in the middle ear the same as the outside world. It also helps to drain fluid from the middle ear space. CAUSES   OME occurs when the eustachian tube gets blocked. Blockage can come from:  Ear infections.     Colds and other upper respiratory infections.     Allergies.    Irritants such as cigarette smoke.     Sudden changes in air pressure (such as descending in an airplane).     Enlarged adenoids.  During colds and upper respiratory infections, the middle ear space can become temporarily filled with fluid. This can happen after an ear infection also. Once the infection clears, the fluid will generally drain out of the ear through the eustachian tube. If it does not, then OME occurs. SYMPTOMS    Hearing loss.     A feeling of fullness in the ear - but no pain.     Young children may not show any symptoms.  DIAGNOSIS    Diagnosis of OME is made by an ear exam.     Tests may be done to check on the movement of the eardrum.     Hearing exams may be done.  TREATMENT    The fluid most often goes away without treatment.     If allergy is the cause, allergy treatment may be helpful.     Fluid that persists for several months may require minor surgery. A small tube is placed in the ear drum to:     Drain the fluid.     Restore the air in the middle ear space.     In certain situations, antibiotics are used to avoid surgery.     Surgery may be done to remove enlarged adenoids (if this is the cause).  HOME CARE INSTRUCTIONS    Keep children away from tobacco smoke.     Be sure to keep follow up  appointments, if any.  SEEK MEDICAL CARE IF:    Hearing is not better in 3 months.     Hearing is worse.     Ear pain.     Drainage from the ear.     Dizziness.  Document Released: 10/24/2003 Document Revised: 04/15/2011 Document Reviewed: 08/23/2008 ExitCare Patient Information 2012 [employer]. 

## 2011-11-24 ENCOUNTER — Other Ambulatory Visit: Payer: Self-pay | Admitting: Nurse Practitioner

## 2011-11-24 ENCOUNTER — Other Ambulatory Visit (HOSPITAL_COMMUNITY)
Admission: RE | Admit: 2011-11-24 | Discharge: 2011-11-24 | Disposition: A | Discharge status: Home / Self Care | Payer: 59 | Source: Ambulatory Visit | Attending: Obstetrics and Gynecology | Admitting: Obstetrics and Gynecology

## 2011-11-24 DIAGNOSIS — Z113 Encounter for screening for infections with a predominantly sexual mode of transmission: Secondary | ICD-10-CM | POA: Insufficient documentation

## 2011-11-24 DIAGNOSIS — Z01419 Encounter for gynecological examination (general) (routine) without abnormal findings: Secondary | ICD-10-CM | POA: Insufficient documentation

## 2011-11-25 ENCOUNTER — Other Ambulatory Visit: Payer: Self-pay | Admitting: Endocrinology

## 2011-11-25 DIAGNOSIS — M79659 Pain in unspecified thigh: Secondary | ICD-10-CM

## 2012-01-01 ENCOUNTER — Ambulatory Visit
Admission: RE | Admit: 2012-01-01 | Discharge: 2012-01-01 | Disposition: A | Discharge status: Home / Self Care | Payer: 59 | Source: Ambulatory Visit | Attending: Endocrinology | Admitting: Endocrinology

## 2012-01-01 DIAGNOSIS — M79659 Pain in unspecified thigh: Secondary | ICD-10-CM

## 2012-11-24 ENCOUNTER — Other Ambulatory Visit: Payer: Self-pay | Admitting: Nurse Practitioner

## 2012-11-24 ENCOUNTER — Other Ambulatory Visit (HOSPITAL_COMMUNITY)
Admission: RE | Admit: 2012-11-24 | Discharge: 2012-11-24 | Disposition: A | Discharge status: Home / Self Care | Payer: 59 | Source: Ambulatory Visit | Attending: Obstetrics and Gynecology | Admitting: Obstetrics and Gynecology

## 2012-11-24 DIAGNOSIS — Z1151 Encounter for screening for human papillomavirus (HPV): Secondary | ICD-10-CM | POA: Insufficient documentation

## 2012-11-24 DIAGNOSIS — Z01419 Encounter for gynecological examination (general) (routine) without abnormal findings: Secondary | ICD-10-CM | POA: Insufficient documentation

## 2013-01-02 ENCOUNTER — Telehealth: Payer: Self-pay | Admitting: Family Medicine

## 2013-01-02 NOTE — Telephone Encounter (Signed)
Patient calling, states she is going back to school and needs to know if we have record of her last TB skin test.  I was unable to locate.

## 2013-01-02 NOTE — Telephone Encounter (Signed)
Discuss with patient advise her we have no documentation of TB skin test. Advise Pt that this is done once a year and  I would be glad to schedule her for one. Pt states that [facility] so [facility] will get it done there but if for some reason she is unable to she will give [place] a call back.

## 2013-01-23 ENCOUNTER — Ambulatory Visit (INDEPENDENT_AMBULATORY_CARE_PROVIDER_SITE_OTHER): Payer: 59 | Admitting: Family Medicine

## 2013-01-23 ENCOUNTER — Encounter: Payer: Self-pay | Admitting: Family Medicine

## 2013-01-23 VITALS — BP 112/62 | HR 62 | Temp 98.0°F | Wt 124.2 lb

## 2013-01-23 DIAGNOSIS — N390 Urinary tract infection, site not specified: Secondary | ICD-10-CM

## 2013-01-23 DIAGNOSIS — B373 Candidiasis of vulva and vagina: Secondary | ICD-10-CM

## 2013-01-23 DIAGNOSIS — B3731 Acute candidiasis of vulva and vagina: Secondary | ICD-10-CM

## 2013-01-23 LAB — POCT URINALYSIS DIPSTICK
Bilirubin, UA: NEGATIVE
Blood, UA: NEGATIVE
Glucose, UA: NEGATIVE
Ketones, UA: NEGATIVE
Leukocytes, UA: NEGATIVE
Nitrite, UA: NEGATIVE
Protein, UA: NEGATIVE
Spec Grav, UA: <=1.005
Urobilinogen, UA: 0.2
pH, UA: 6.5

## 2013-01-23 NOTE — Progress Notes (Signed)
  Subjective:    Patient ID: Tiffany Browning, female    DOB: 1956-07-23, 57 y.o.   MRN: [id number]  HPI Pt here c/o urinary burning and itching x few days.  No fever, no d/c, no abd pain.    Review of Systems    as above Objective:   Physical Exam BP 112/62  Pulse 62  Temp(Src) 98 F (36.7 C) (Oral)  Wt 124 lb 3.2 oz (56.337 kg)  BMI 23.27 kg/m2  SpO2 98% General appearance: alert, cooperative, appears stated age and no distress Abdomen: soft, non-tender; bowel sounds normal; no masses,  no organomegaly Pelvic: deferred--pt preferred to wait  UA dip-- neg     Assessment & Plan:

## 2013-01-23 NOTE — Assessment & Plan Note (Addendum)
Pt wants to use otc meds rto prn Check culture

## 2013-01-23 NOTE — Patient Instructions (Signed)
Candidal Vulvovaginitis  Candidal vulvovaginitis is an infection of the vagina and vulva. The vulva is the skin around the opening of the vagina. This may cause itching and discomfort in and around the vagina.   HOME CARE  · Only take medicine as told by your doctor.  · Do not have sex (intercourse) until the infection is healed or as told by your doctor.  · Practice safe sex.  · Tell your sex partner about your infection.  · Do not douche or use tampons.  · Wear cotton underwear. Do not wear tight pants or panty hose.  · Eat yogurt. This may help treat and prevent yeast infections.  GET HELP RIGHT AWAY IF:   · You have a fever.  · Your problems get worse during treatment or do not get better in 3 days.  · You have discomfort, irritation, or itching in your vagina or vulva area.  · You have pain after sex.  · You start to get belly (abdominal) pain.  MAKE SURE YOU:  · Understand these instructions.  · Will watch your condition.  · Will get help right away if you are not doing well or get worse.  Document Released: 10/30/2008 Document Revised: 10/26/2011 Document Reviewed: 10/30/2008  ExitCare® Patient Information ©2014 [employer].

## 2013-01-25 LAB — URINE CULTURE
Colony Count: NO GROWTH
Organism ID, Bacteria: NO GROWTH

## 2013-03-13 DIAGNOSIS — R011 Cardiac murmur, unspecified: Secondary | ICD-10-CM | POA: Insufficient documentation

## 2013-03-13 DIAGNOSIS — E785 Hyperlipidemia, unspecified: Secondary | ICD-10-CM | POA: Insufficient documentation

## 2013-05-07 ENCOUNTER — Emergency Department
Admission: EM | Admit: 2013-05-07 | Discharge: 2013-05-07 | Disposition: A | Discharge status: Home / Self Care | Payer: 59 | Source: Home / Self Care | Attending: Emergency Medicine | Admitting: Emergency Medicine

## 2013-05-07 DIAGNOSIS — W540XXA Bitten by dog, initial encounter: Secondary | ICD-10-CM

## 2013-05-07 DIAGNOSIS — T148XXA Other injury of unspecified body region, initial encounter: Secondary | ICD-10-CM

## 2013-05-07 DIAGNOSIS — Z23 Encounter for immunization: Secondary | ICD-10-CM

## 2013-05-07 MED ORDER — AMOXICILLIN-POT CLAVULANATE 875-125 MG PO TABS: 1.0000 | ORAL_TABLET | Freq: Two times a day (BID) | ORAL | Status: DC

## 2013-05-07 NOTE — ED Provider Notes (Signed)
CSN: [id number]     Arrival date & time 05/07/13  1349 History   First MD Initiated Contact with Patient 05/07/13 1423     Chief Complaint  Patient presents with  . Animal Bite   (Consider location/radiation/quality/duration/timing/severity/associated sxs/prior Treatment) HPI Her dog bit her R arm earlier today, about 4 hours ago.  They were playing and the dog was not acting abnormal.  She states that she cleaned it out very well with alcohol.  She has been taking some aspirin for the pain which has helped.  Her dog is up-to-date on all shots.  There are 2 wounds, one of which is no longer bleeding and the other is oozing blood slowly.  Minimal pain.  She does not remember her last tetanus shot.    Past Medical History  Diagnosis Date  . Diabetes mellitus     Type 1  . Asthma   . Allergy   . Thyroid disease     Hypothyroidism   Past Surgical History  Procedure Laterality Date  . Tonsilectomy, adenoidectomy, bilateral myringotomy and tubes    . Appendectomy    . Shoulder arthroscopy  2004    b/L --second one 2011  . Tubal ligation      then reversed it  . Ruptured ovarian cyst    . Nasal sinus surgery     Family History  Problem Relation Age of Onset  . Cancer Mother     cervical  . Hypertension Mother   . Hyperlipidemia Mother   . Heart disease Father 37    MI---- cabg before that  . Hypothyroidism Sister   . Heart disease Brother 34    MI--- second one 18  . Hyperlipidemia Brother   . Hypertension Brother   . Hypothyroidism Sister    History  Substance Use Topics  . Smoking status: Never Smoker   . Smokeless tobacco: Never Used  . Alcohol Use: No   OB History   Grav Para Term Preterm Abortions TAB SAB Ect Mult Living                 Review of Systems  All other systems reviewed and are negative.    Allergies  Review of patient's allergies indicates not on file.  Home Medications   Current Outpatient Rx  Name  Route  Sig  Dispense  Refill  .  amoxicillin (AMOXIL) 875 MG tablet   Oral   Take 875 mg by mouth 2 (two) times daily.         [name] amoxicillin-clavulanate (AUGMENTIN) 875-125 MG per tablet   Oral   Take 1 tablet by mouth every 12 (twelve) hours.   14 tablet   0   . aspirin 81 MG tablet   Oral   Take 160 mg by mouth daily.           . calcium carbonate (OS-CAL) 600 MG TABS   Oral   Take 600 mg by mouth daily.           [name] estradiol (VIVELLE-DOT) 0.05 MG/24HR   Transdermal   Place 1 patch onto the skin once a week.           . fish oil-omega-3 fatty acids 1000 MG capsule   Oral   Take 1 g by mouth daily.           [name] levothyroxine (SYNTHROID, LEVOTHROID) 200 MCG tablet   Oral   Take 200 mcg by mouth daily.           [name]  EXPIRED: mometasone (NASONEX) 50 MCG/ACT nasal spray   Nasal   Place 2 sprays into the nose daily.   17 g   12   . montelukast (SINGULAIR) 10 MG tablet   Oral   Take 10 mg by mouth at bedtime.           . Multiple Vitamin (MULTIVITAMIN) tablet   Oral   Take 1 tablet by mouth daily.           . NON FORMULARY   Subcutaneous   Inject 0.4 application into the skin every hour. NOVOLOG PUMP          . Selenium 200 MCG TABS   Oral   Take 1 tablet by mouth daily.            BP 149/68  Pulse 63  Temp(Src) 97.7 F (36.5 C) (Oral)  Ht 5\' 1"  (1.549 m)  Wt 126 lb 8 oz (57.38 kg)  BMI 23.91 kg/m2  SpO2 100% Physical Exam  Skin:     There are 2 small lacerations on her upper anterior arm approximately 1 cm in length each.  They are through the skin but not into the muscle.  No tendon or vascular damage seen.  Full range of motion.  Both of them are not bleeding at the time however do have some serous sanguinous discharge with manipulation of the wound.  Minimal tenderness to palpation.  No foreign bodies seen.  Distal neurovascular status intact.    ED Course  Procedures (including critical care time) Labs Review Labs Reviewed - No data to display Imaging  Review No results found.  MDM   1. Dog bite, initial encounter    Td given today. Not stitched because minor wound and possibility of infection.  Placed on Augmentin for 7 days.  Using steristrips and gauze, covered wound and gave precautions.  Avoid ASA if possible to slow bleeding, Tylenol to be used instead. Follow up as needed, especially if worsning because she is diabetic.  Marlaine Hind, MD 05/07/13 1501

## 2013-05-07 NOTE — ED Notes (Signed)
Dog bite by own dog this morning around 10am to upper right arm, UTD with shots.

## 2013-09-07 ENCOUNTER — Encounter: Payer: Self-pay | Admitting: Family Medicine

## 2013-11-08 ENCOUNTER — Telehealth: Payer: Self-pay

## 2013-11-20 ENCOUNTER — Telehealth: Payer: Self-pay

## 2013-11-20 NOTE — Telephone Encounter (Signed)
Medication and allergies:  Reviewed and updated  90 day supply/mail order: n/a Local pharmacy:  [id number] - [place], [place]   Immunizations due:  Tdap-DUE   A/P: No changes to personal, family history or past surgical hx PAP- 11/24/12-negative for intraepithelial lesion or malignancy; positive for candida CCS- 02/25/08- ulcerated mucosa otherwise normal MMG- 06/02/13- negative Flu- 05/23/13 Tdap- DUE, would like to receive during this appointment; patient has [facility]   To Discuss with Provider: Nothing at this time.

## 2013-11-21 ENCOUNTER — Ambulatory Visit (INDEPENDENT_AMBULATORY_CARE_PROVIDER_SITE_OTHER): Payer: 59 | Admitting: Family Medicine

## 2013-11-21 ENCOUNTER — Encounter: Payer: Self-pay | Admitting: Family Medicine

## 2013-11-21 VITALS — BP 134/60 | HR 72 | Temp 97.9°F | Ht 61.25 in | Wt 127.4 lb

## 2013-11-21 DIAGNOSIS — Z23 Encounter for immunization: Secondary | ICD-10-CM

## 2013-11-21 DIAGNOSIS — E108 Type 1 diabetes mellitus with unspecified complications: Secondary | ICD-10-CM

## 2013-11-21 DIAGNOSIS — Z0000 Encounter for general adult medical examination without abnormal findings: Secondary | ICD-10-CM

## 2013-11-21 DIAGNOSIS — IMO0002 Reserved for concepts with insufficient information to code with codable children: Secondary | ICD-10-CM

## 2013-11-21 DIAGNOSIS — E1065 Type 1 diabetes mellitus with hyperglycemia: Secondary | ICD-10-CM

## 2013-11-21 MED ORDER — INSULIN ASPART 100 UNIT/ML ~~LOC~~ SOLN: SUBCUTANEOUS | Status: DC

## 2013-11-21 NOTE — Patient Instructions (Addendum)
Preventive Care for Adults, Female A healthy lifestyle and preventive care can promote health and wellness. Preventive health guidelines for women include the following key practices.  A routine yearly physical is a good way to check with your health care provider about your health and preventive screening. It is a chance to share any concerns and updates on your health and to receive a thorough exam.  Visit your dentist for a routine exam and preventive care every 6 months. Brush your teeth twice a day and floss once a day. Good oral hygiene prevents tooth decay and gum disease.  The frequency of eye exams is based on your age, health, family medical history, use of contact lenses, and other factors. Follow your health care provider's recommendations for frequency of eye exams.  Eat a healthy diet. Foods like vegetables, fruits, whole grains, low-fat dairy products, and lean protein foods contain the nutrients you need without too many calories. Decrease your intake of foods high in solid fats, added sugars, and salt. Eat the right amount of calories for you.Get information about a proper diet from your health care provider, if necessary.  Regular physical exercise is one of the most important things you can do for your health. Most adults should get at least 150 minutes of moderate-intensity exercise (any activity that increases your heart rate and causes you to sweat) each week. In addition, most adults need muscle-strengthening exercises on 2 or more days a week.  Maintain a healthy weight. The body mass index (BMI) is a screening tool to identify possible weight problems. It provides an estimate of body fat based on height and weight. Your health care provider can find your BMI, and can help you achieve or maintain a healthy weight.For adults 20 years and older:  A BMI below 18.5 is considered underweight.  A BMI of 18.5 to 24.9 is normal.  A BMI of 25 to 29.9 is considered overweight.  A  BMI of 30 and above is considered obese.  Maintain normal blood lipids and cholesterol levels by exercising and minimizing your intake of saturated fat. Eat a balanced diet with plenty of fruit and vegetables. Blood tests for lipids and cholesterol should begin at age 62 and be repeated every 5 years. If your lipid or cholesterol levels are high, you are over 50, or you are at high risk for heart disease, you may need your cholesterol levels checked more frequently.Ongoing high lipid and cholesterol levels should be treated with medicines if diet and exercise are not working.  If you smoke, find out from your health care provider how to quit. If you do not use tobacco, do not start.  Lung cancer screening is recommended for adults aged 36 80 years who are at high risk for developing lung cancer because of a history of smoking. A yearly low-dose CT scan of the lungs is recommended for people who have at least a 30-pack-year history of smoking and are a current smoker or have quit within the past 15 years. A pack year of smoking is smoking an average of 1 pack of cigarettes a day for 1 year (for example: 1 pack a day for 30 years or 2 packs a day for 15 years). Yearly screening should continue until the smoker has stopped smoking for at least 15 years. Yearly screening should be stopped for people who develop a health problem that would prevent them from having lung cancer treatment.  If you are pregnant, do not drink alcohol. If you  are breastfeeding, be very cautious about drinking alcohol. If you are not pregnant and choose to drink alcohol, do not have more than 1 drink per day. One drink is considered to be 12 ounces (355 mL) of beer, 5 ounces (148 mL) of wine, or 1.5 ounces (44 mL) of liquor.  Avoid use of street drugs. Do not share needles with anyone. Ask for help if you need support or instructions about stopping the use of drugs.  High blood pressure causes heart disease and increases the risk  of stroke. Your blood pressure should be checked at least every 1 to 2 years. Ongoing high blood pressure should be treated with medicines if weight loss and exercise do not work.  If you are 39 57 years old, ask your health care provider if you should take aspirin to prevent strokes.  Diabetes screening involves taking a blood sample to check your fasting blood sugar level. This should be done once every 3 years, after age 56, if you are within normal weight and without risk factors for diabetes. Testing should be considered at a younger age or be carried out more frequently if you are overweight and have at least 1 risk factor for diabetes.  Breast cancer screening is essential preventive care for women. You should practice "breast self-awareness." This means understanding the normal appearance and feel of your breasts and may include breast self-examination. Any changes detected, no matter how small, should be reported to a health care provider. Women in their 40s and 30s should have a clinical breast exam (CBE) by a health care provider as part of a regular health exam every 1 to 3 years. After age 28, women should have a CBE every year. Starting at age 72, women should consider having a mammogram (breast X-ray test) every year. Women who have a family history of breast cancer should talk to their health care provider about genetic screening. Women at a high risk of breast cancer should talk to their health care providers about having an MRI and a mammogram every year.  Breast cancer gene (BRCA)-related cancer risk assessment is recommended for women who have family members with BRCA-related cancers. BRCA-related cancers include breast, ovarian, tubal, and peritoneal cancers. Having family members with these cancers may be associated with an increased risk for harmful changes (mutations) in the breast cancer genes BRCA1 and BRCA2. Results of the assessment will determine the need for genetic counseling  and BRCA1 and BRCA2 testing.  The Pap test is a screening test for cervical cancer. A Pap test can show cell changes on the cervix that might become cervical cancer if left untreated. A Pap test is a procedure in which cells are obtained and examined from the lower end of the uterus (cervix).  Women should have a Pap test starting at age 59.  Between ages 42 and 13, Pap tests should be repeated every 2 years.  Beginning at age 53, you should have a Pap test every 3 years as long as the past 3 Pap tests have been normal.  Some women have medical problems that increase the chance of getting cervical cancer. Talk to your health care provider about these problems. It is especially important to talk to your health care provider if a new problem develops soon after your last Pap test. In these cases, your health care provider may recommend more frequent screening and Pap tests.  The above recommendations are the same for women who have or have not gotten the vaccine  for human papillomavirus (HPV).  If you had a hysterectomy for a problem that was not cancer or a condition that could lead to cancer, then you no longer need Pap tests. Even if you no longer need a Pap test, a regular exam is a good idea to make sure no other problems are starting.  If you are between ages 58 and 10 years, and you have had normal Pap tests going back 10 years, you no longer need Pap tests. Even if you no longer need a Pap test, a regular exam is a good idea to make sure no other problems are starting.  If you have had past treatment for cervical cancer or a condition that could lead to cancer, you need Pap tests and screening for cancer for at least 20 years after your treatment.  If Pap tests have been discontinued, risk factors (such as a new sexual partner) need to be reassessed to determine if screening should be resumed.  The HPV test is an additional test that may be used for cervical cancer screening. The HPV test  looks for the virus that can cause the cell changes on the cervix. The cells collected during the Pap test can be tested for HPV. The HPV test could be used to screen women aged 67 years and older, and should be used in women of any age who have unclear Pap test results. After the age of 65, women should have HPV testing at the same frequency as a Pap test.  Colorectal cancer can be detected and often prevented. Most routine colorectal cancer screening begins at the age of 25 years and continues through age 66 years. However, your health care provider may recommend screening at an earlier age if you have risk factors for colon cancer. On a yearly basis, your health care provider may provide home test kits to check for hidden blood in the stool. Use of a small camera at the end of a tube, to directly examine the colon (sigmoidoscopy or colonoscopy), can detect the earliest forms of colorectal cancer. Talk to your health care provider about this at age 79, when routine screening begins. Direct exam of the colon should be repeated every 5 10 years through age 47 years, unless early forms of pre-cancerous polyps or small growths are found.  People who are at an increased risk for hepatitis B should be screened for this virus. You are considered at high risk for hepatitis B if:  You were born in a country where hepatitis B occurs often. Talk with your health care provider about which countries are considered high risk.  Your parents were born in a high-risk country and you have not received a shot to protect against hepatitis B (hepatitis B vaccine).  You have HIV or AIDS.  You use needles to inject street drugs.  You live with, or have sex with, someone who has Hepatitis B.  You get hemodialysis treatment.  You take certain medicines for conditions like cancer, organ transplantation, and autoimmune conditions.  Hepatitis C blood testing is recommended for all people born from [birth year] through 1965 and  any individual with known risks for hepatitis C.  Practice safe sex. Use condoms and avoid high-risk sexual practices to reduce the spread of sexually transmitted infections (STIs). STIs include gonorrhea, chlamydia, syphilis, trichomonas, herpes, HPV, and human immunodeficiency virus (HIV). Herpes, HIV, and HPV are viral illnesses that have no cure. They can result in disability, cancer, and death. Sexually active women aged 66  years and younger should be checked for chlamydia. Older women with new or multiple partners should also be tested for chlamydia. Testing for other STIs is recommended if you are sexually active and at increased risk.  Osteoporosis is a disease in which the bones lose minerals and strength with aging. This can result in serious bone fractures or breaks. The risk of osteoporosis can be identified using a bone density scan. Women ages 18 years and over and women at risk for fractures or osteoporosis should discuss screening with their health care providers. Ask your health care provider whether you should take a calcium supplement or vitamin D to reduce the rate of osteoporosis.  Menopause can be associated with physical symptoms and risks. Hormone replacement therapy is available to decrease symptoms and risks. You should talk to your health care provider about whether hormone replacement therapy is right for you.  Use sunscreen. Apply sunscreen liberally and repeatedly throughout the day. You should seek shade when your shadow is shorter than you. Protect yourself by wearing long sleeves, pants, a wide-brimmed hat, and sunglasses year round, whenever you are outdoors.  Once a month, do a whole body skin exam, using a mirror to look at the skin on your back. Tell your health care provider of new moles, moles that have irregular borders, moles that are larger than a pencil eraser, or moles that have changed in shape or color.  Stay current with required vaccines  (immunizations).  Influenza vaccine. All adults should be immunized every year.  Tetanus, diphtheria, and acellular pertussis (Td, Tdap) vaccine. Pregnant women should receive 1 dose of Tdap vaccine during each pregnancy. The dose should be obtained regardless of the length of time since the last dose. Immunization is preferred during the 27th 36th week of gestation. An adult who has not previously received Tdap or who does not know her vaccine status should receive 1 dose of Tdap. This initial dose should be followed by tetanus and diphtheria toxoids (Td) booster doses every 10 years. Adults with an unknown or incomplete history of completing a 3-dose immunization series with Td-containing vaccines should begin or complete a primary immunization series including a Tdap dose. Adults should receive a Td booster every 10 years.  Varicella vaccine. An adult without evidence of immunity to varicella should receive 2 doses or a second dose if she has previously received 1 dose. Pregnant females who do not have evidence of immunity should receive the first dose after pregnancy. This first dose should be obtained before leaving the health care facility. The second dose should be obtained 4 8 weeks after the first dose.  Human papillomavirus (HPV) vaccine. Females aged 9 26 years who have not received the vaccine previously should obtain the 3-dose series. The vaccine is not recommended for use in pregnant females. However, pregnancy testing is not needed before receiving a dose. If a female is found to be pregnant after receiving a dose, no treatment is needed. In that case, the remaining doses should be delayed until after the pregnancy. Immunization is recommended for any person with an immunocompromised condition through the age of 51 years if she did not get any or all doses earlier. During the 3-dose series, the second dose should be obtained 4 8 weeks after the first dose. The third dose should be obtained  24 weeks after the first dose and 16 weeks after the second dose.  Zoster vaccine. One dose is recommended for adults aged 57 years or older unless certain  conditions are present.  Measles, mumps, and rubella (MMR) vaccine. Adults born before [birth year] generally are considered immune to measles and mumps. Adults born in [birth year] or later should have 1 or more doses of MMR vaccine unless there is a contraindication to the vaccine or there is laboratory evidence of immunity to each of the three diseases. A routine second dose of MMR vaccine should be obtained at least 28 days after the first dose for students attending postsecondary schools, health care workers, or international travelers. People who received inactivated measles vaccine or an unknown type of measles vaccine during 1963 1967 should receive 2 doses of MMR vaccine. People who received inactivated mumps vaccine or an unknown type of mumps vaccine before 1979 and are at high risk for mumps infection should consider immunization with 2 doses of MMR vaccine. For females of childbearing age, rubella immunity should be determined. If there is no evidence of immunity, females who are not pregnant should be vaccinated. If there is no evidence of immunity, females who are pregnant should delay immunization until after pregnancy. Unvaccinated health care workers born before [birth year] who lack laboratory evidence of measles, mumps, or rubella immunity or laboratory confirmation of disease should consider measles and mumps immunization with 2 doses of MMR vaccine or rubella immunization with 1 dose of MMR vaccine.  Pneumococcal 13-valent conjugate (PCV13) vaccine. When indicated, a person who is uncertain of her immunization history and has no record of immunization should receive the PCV13 vaccine. An adult aged 42 years or older who has certain medical conditions and has not been previously immunized should receive 1 dose of PCV13 vaccine. This PCV13 should be followed  with a dose of pneumococcal polysaccharide (PPSV23) vaccine. The PPSV23 vaccine dose should be obtained at least 8 weeks after the dose of PCV13 vaccine. An adult aged 4 years or older who has certain medical conditions and previously received 1 or more doses of PPSV23 vaccine should receive 1 dose of PCV13. The PCV13 vaccine dose should be obtained 1 or more years after the last PPSV23 vaccine dose.  Pneumococcal polysaccharide (PPSV23) vaccine. When PCV13 is also indicated, PCV13 should be obtained first. All adults aged 27 years and older should be immunized. An adult younger than age 33 years who has certain medical conditions should be immunized. Any person who resides in a nursing home or long-term care facility should be immunized. An adult smoker should be immunized. People with an immunocompromised condition and certain other conditions should receive both PCV13 and PPSV23 vaccines. People with human immunodeficiency virus (HIV) infection should be immunized as soon as possible after diagnosis. Immunization during chemotherapy or radiation therapy should be avoided. Routine use of PPSV23 vaccine is not recommended for American Indians, [place] Natives, or people younger than 65 years unless there are medical conditions that require PPSV23 vaccine. When indicated, people who have unknown immunization and have no record of immunization should receive PPSV23 vaccine. One-time revaccination 5 years after the first dose of PPSV23 is recommended for people aged 13 64 years who have chronic kidney failure, nephrotic syndrome, asplenia, or immunocompromised conditions. People who received 1 2 doses of PPSV23 before age 66 years should receive another dose of PPSV23 vaccine at age 27 years or later if at least 5 years have passed since the previous dose. Doses of PPSV23 are not needed for people immunized with PPSV23 at or after age 33 years.  Meningococcal vaccine. Adults with asplenia or persistent complement  component deficiencies should receive 2  doses of quadrivalent meningococcal conjugate (MenACWY-D) vaccine. The doses should be obtained at least 2 months apart. Microbiologists working with certain meningococcal bacteria, [place] recruits, people at risk during an outbreak, and people who travel to or live in countries with a high rate of meningitis should be immunized. A first-year college student up through age 49 years who is living in a residence hall should receive a dose if she did not receive a dose on or after her 16th birthday. Adults who have certain high-risk conditions should receive one or more doses of vaccine.  Hepatitis A vaccine. Adults who wish to be protected from this disease, have certain high-risk conditions, work with hepatitis A-infected animals, work in hepatitis A research labs, or travel to or work in countries with a high rate of hepatitis A should be immunized. Adults who were previously unvaccinated and who anticipate close contact with an international adoptee during the first 60 days after arrival in the [place] from a country with a high rate of hepatitis A should be immunized.  Hepatitis B vaccine. Adults who wish to be protected from this disease, have certain high-risk conditions, may be exposed to blood or other infectious body fluids, are household contacts or sex partners of hepatitis B positive people, are clients or workers in certain care facilities, or travel to or work in countries with a high rate of hepatitis B should be immunized.  Haemophilus influenzae type b (Hib) vaccine. A previously unvaccinated person with asplenia or sickle cell disease or having a scheduled splenectomy should receive 1 dose of Hib vaccine. Regardless of previous immunization, a recipient of a hematopoietic stem cell transplant should receive a 3-dose series 6 12 months after her successful transplant. Hib vaccine is not recommended for adults with HIV infection. Preventive  Services / Frequency Ages 24 to 39years  Blood pressure check.** / Every 1 to 2 years.  Lipid and cholesterol check.** / Every 5 years beginning at age 66.  Clinical breast exam.** / Every 3 years for women in their 12s and 24s.  BRCA-related cancer risk assessment.** / For women who have family members with a BRCA-related cancer (breast, ovarian, tubal, or peritoneal cancers).  Pap test.** / Every 2 years from ages 31 through 69. Every 3 years starting at age 64 through age 76 or 89 with a history of 3 consecutive normal Pap tests.  HPV screening.** / Every 3 years from ages 10 through ages 10 to 96 with a history of 3 consecutive normal Pap tests.  Hepatitis C blood test.** / For any individual with known risks for hepatitis C.  Skin self-exam. / Monthly.  Influenza vaccine. / Every year.  Tetanus, diphtheria, and acellular pertussis (Tdap, Td) vaccine.** / Consult your health care provider. Pregnant women should receive 1 dose of Tdap vaccine during each pregnancy. 1 dose of Td every 10 years.  Varicella vaccine.** / Consult your health care provider. Pregnant females who do not have evidence of immunity should receive the first dose after pregnancy.  HPV vaccine. / 3 doses over 6 months, if 90 and younger. The vaccine is not recommended for use in pregnant females. However, pregnancy testing is not needed before receiving a dose.  Measles, mumps, rubella (MMR) vaccine.** / You need at least 1 dose of MMR if you were born in 1957 or later. You may also need a 2nd dose. For females of childbearing age, rubella immunity should be determined. If there is no evidence of immunity, females who are not  pregnant should be vaccinated. If there is no evidence of immunity, females who are pregnant should delay immunization until after pregnancy.  Pneumococcal 13-valent conjugate (PCV13) vaccine.** / Consult your health care provider.  Pneumococcal polysaccharide (PPSV23) vaccine.** / 1 to 2  doses if you smoke cigarettes or if you have certain conditions.  Meningococcal vaccine.** / 1 dose if you are age 88 to 6 years and a Market researcher living in a residence hall, or have one of several medical conditions, you need to get vaccinated against meningococcal disease. You may also need additional booster doses.  Hepatitis A vaccine.** / Consult your health care provider.  Hepatitis B vaccine.** / Consult your health care provider.  Haemophilus influenzae type b (Hib) vaccine.** / Consult your health care provider. Ages 23 to 64years  Blood pressure check.** / Every 1 to 2 years.  Lipid and cholesterol check.** / Every 5 years beginning at age 20 years.  Lung cancer screening. / Every year if you are aged 51 80 years and have a 30-pack-year history of smoking and currently smoke or have quit within the past 15 years. Yearly screening is stopped once you have quit smoking for at least 15 years or develop a health problem that would prevent you from having lung cancer treatment.  Clinical breast exam.** / Every year after age 8 years.  BRCA-related cancer risk assessment.** / For women who have family members with a BRCA-related cancer (breast, ovarian, tubal, or peritoneal cancers).  Mammogram.** / Every year beginning at age 10 years and continuing for as long as you are in good health. Consult with your health care provider.  Pap test.** / Every 3 years starting at age 30 years through age 5 or 61 years with a history of 3 consecutive normal Pap tests.  HPV screening.** / Every 3 years from ages 39 years through ages 72 to 19 years with a history of 3 consecutive normal Pap tests.  Fecal occult blood test (FOBT) of stool. / Every year beginning at age 59 years and continuing until age 27 years. You may not need to do this test if you get a colonoscopy every 10 years.  Flexible sigmoidoscopy or colonoscopy.** / Every 5 years for a flexible sigmoidoscopy or every  10 years for a colonoscopy beginning at age 110 years and continuing until age 63 years.  Hepatitis C blood test.** / For all people born from [birth year] through 1964 and any individual with known risks for hepatitis C.  Skin self-exam. / Monthly.  Influenza vaccine. / Every year.  Tetanus, diphtheria, and acellular pertussis (Tdap/Td) vaccine.** / Consult your health care provider. Pregnant women should receive 1 dose of Tdap vaccine during each pregnancy. 1 dose of Td every 10 years.  Varicella vaccine.** / Consult your health care provider. Pregnant females who do not have evidence of immunity should receive the first dose after pregnancy.  Zoster vaccine.** / 1 dose for adults aged 46 years or older.  Measles, mumps, rubella (MMR) vaccine.** / You need at least 1 dose of MMR if you were born in 1956 or later. You may also need a 2nd dose. For females of childbearing age, rubella immunity should be determined. If there is no evidence of immunity, females who are not pregnant should be vaccinated. If there is no evidence of immunity, females who are pregnant should delay immunization until after pregnancy.  Pneumococcal 13-valent conjugate (PCV13) vaccine.** / Consult your health care provider.  Pneumococcal polysaccharide (PPSV23) vaccine.** / 1  to 2 doses if you smoke cigarettes or if you have certain conditions.  Meningococcal vaccine.** / Consult your health care provider.  Hepatitis A vaccine.** / Consult your health care provider.  Hepatitis B vaccine.** / Consult your health care provider.  Haemophilus influenzae type b (Hib) vaccine.** / Consult your health care provider. Ages 7 years and over  Blood pressure check.** / Every 1 to 2 years.  Lipid and cholesterol check.** / Every 5 years beginning at age 90 years.  Lung cancer screening. / Every year if you are aged 76 80 years and have a 30-pack-year history of smoking and currently smoke or have quit within the past 15 years.  Yearly screening is stopped once you have quit smoking for at least 15 years or develop a health problem that would prevent you from having lung cancer treatment.  Clinical breast exam.** / Every year after age 84 years.  BRCA-related cancer risk assessment.** / For women who have family members with a BRCA-related cancer (breast, ovarian, tubal, or peritoneal cancers).  Mammogram.** / Every year beginning at age 37 years and continuing for as long as you are in good health. Consult with your health care provider.  Pap test.** / Every 3 years starting at age 62 years through age 34 or 7 years with 3 consecutive normal Pap tests. Testing can be stopped between 65 and 70 years with 3 consecutive normal Pap tests and no abnormal Pap or HPV tests in the past 10 years.  HPV screening.** / Every 3 years from ages 78 years through ages 82 or 69 years with a history of 3 consecutive normal Pap tests. Testing can be stopped between 65 and 70 years with 3 consecutive normal Pap tests and no abnormal Pap or HPV tests in the past 10 years.  Fecal occult blood test (FOBT) of stool. / Every year beginning at age 8 years and continuing until age 75 years. You may not need to do this test if you get a colonoscopy every 10 years.  Flexible sigmoidoscopy or colonoscopy.** / Every 5 years for a flexible sigmoidoscopy or every 10 years for a colonoscopy beginning at age 61 years and continuing until age 64 years.  Hepatitis C blood test.** / For all people born from [birth year] through 1964 and any individual with known risks for hepatitis C.  Osteoporosis screening.** / A one-time screening for women ages 31 years and over and women at risk for fractures or osteoporosis.  Skin self-exam. / Monthly.  Influenza vaccine. / Every year.  Tetanus, diphtheria, and acellular pertussis (Tdap/Td) vaccine.** / 1 dose of Td every 10 years.  Varicella vaccine.** / Consult your health care provider.  Zoster vaccine.** / 1  dose for adults aged 33 years or older.  Pneumococcal 13-valent conjugate (PCV13) vaccine.** / Consult your health care provider.  Pneumococcal polysaccharide (PPSV23) vaccine.** / 1 dose for all adults aged 22 years and older.  Meningococcal vaccine.** / Consult your health care provider.  Hepatitis A vaccine.** / Consult your health care provider.  Hepatitis B vaccine.** / Consult your health care provider.  Haemophilus influenzae type b (Hib) vaccine.** / Consult your health care provider. ** Family history and personal history of risk and conditions may change your health care provider's recommendations. Document Released: 09/29/2001 Document Revised: 05/24/2013 Document Reviewed: 12/29/2010 [facility] Patient Information 2014 [place], [place].   Diabetes and Standards of Medical Care  Diabetes is complicated. You may find that your diabetes team includes a dietitian, nurse, diabetes educator,  eye doctor, and more. To help everyone know what is going on and to help you get the care you deserve, the following schedule of care was developed to help keep you on track. Below are the tests, exams, vaccines, medicines, education, and plans you will need. HbA1c test This test shows how well you have controlled your glucose over the past 2 3 months. It is used to see if your diabetes management plan needs to be adjusted.   It is performed at least 2 times a year if you are meeting treatment goals.  It is performed 4 times a year if therapy has changed or if you are not meeting treatment goals. Blood pressure test  This test is performed at every routine medical visit. The goal is less than 140/90 mmHg for most people, but 130/80 mmHg in some cases. Ask your health care provider about your goal. Dental exam  Follow up with the dentist regularly. Eye exam  If you are diagnosed with type 1 diabetes as a child, get an exam upon reaching the age of 75 years or older and have had diabetes for 3  5 years. Yearly eye exams are recommended after that initial eye exam.  If you are diagnosed with type 1 diabetes as an adult, get an exam within 5 years of diagnosis and then yearly.  If you are diagnosed with type 2 diabetes, get an exam as soon as possible after the diagnosis and then yearly. Foot care exam  Visual foot exams are performed at every routine medical visit. The exams check for cuts, injuries, or other problems with the feet.  A comprehensive foot exam should be done yearly. This includes visual inspection as well as assessing foot pulses and testing for loss of sensation.  Check your feet nightly for cuts, injuries, or other problems with your feet. Tell your health care provider if anything is not healing. Kidney function test (urine microalbumin)  This test is performed once a year.  Type 1 diabetes: The first test is performed 5 years after diagnosis.  Type 2 diabetes: The first test is performed at the time of diagnosis.  A serum creatinine and estimated glomerular filtration rate (eGFR) test is done once a year to assess the level of chronic kidney disease (CKD), if present. Lipid profile (cholesterol, HDL, LDL, triglycerides)  Performed every 5 years for most people.  The goal for LDL is less than 100 mg/dL. If you are at high risk, the goal is less than 70 mg/dL.  The goal for HDL is 40 mg/dL 50 mg/dL for men and 50 mg/dL 60 mg/dL for women. An HDL cholesterol of 60 mg/dL or higher gives some protection against heart disease.  The goal for triglycerides is less than 150 mg/dL. Influenza vaccine, pneumococcal vaccine, and hepatitis B vaccine  The influenza vaccine is recommended yearly.  The pneumococcal vaccine is generally given once in a lifetime. However, there are some instances when another vaccination is recommended. Check with your health care provider.  The hepatitis B vaccine is also recommended for adults with diabetes. Diabetes self-management  education  Education is recommended at diagnosis and ongoing as needed. Treatment plan  Your treatment plan is reviewed at every medical visit. Document Released: 05/31/2009 Document Revised: 04/05/2013 Document Reviewed: 01/03/2013 [facility] Patient Information 2014 [place].

## 2013-11-21 NOTE — Progress Notes (Signed)
Pre visit review using our clinic review tool, if applicable. No additional management support is needed unless otherwise documented below in the visit note. 

## 2013-11-21 NOTE — Addendum Note (Signed)
Addended by: Rudene Anda on: 11/21/2013 04:33 PM   Modules accepted: Orders

## 2013-11-21 NOTE — Progress Notes (Signed)
Subjective:     Tiffany Browning is a 58 y.o. female and is here for a comprehensive physical exam. The patient reports no problems.  History   Social History  . Marital Status: Married    Spouse Name: N/A    Number of Children: N/A  . Years of Education: N/A   Occupational History  . 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 Smoking status: Never Smoker   . Smokeless tobacco: Never Used  . Alcohol Use: No  . Drug Use: No  . Sexual Activity: Yes    Partners: Male   Other Topics Concern  . Not on file   Social History Narrative  . No narrative on file   Health Maintenance  Topic Date Due  . Tetanus/tdap  05/29/1976  . Influenza Vaccine  03/17/2014  . Mammogram  06/03/2015  . Pap Smear  11/25/2015  . Colonoscopy  02/24/2018    The following portions of the patient's history were reviewed and updated as appropriate:  She  has a past medical history of Diabetes mellitus; Asthma; Allergy; and Thyroid disease. She  does not have any pertinent problems on file. She  has past surgical history that includes Tonsilectomy, adenoidectomy, bilateral myringotomy and tubes; Appendectomy; Shoulder arthroscopy (2004); Tubal ligation; ruptured ovarian cyst; and Nasal sinus surgery. Her family history includes Cancer in her mother; Heart disease (age of onset: 81) in her brother; Heart disease (age of onset: 27) in her father; Hyperlipidemia in her brother and mother; Hypertension in her brother and mother; Hypothyroidism in her sister and sister. She  reports that she has never smoked. She has never used smokeless tobacco. She reports that she does not drink alcohol or use illicit drugs. She has a current medication list which includes the following prescription(s): aspirin, calcium carbonate, estradiol, fish oil-omega-3 fatty acids, levothyroxine, montelukast, multivitamin, NON FORMULARY, and selenium. Current Outpatient Prescriptions on File Prior to Visit   Medication Sig Dispense Refill  . aspirin 81 MG tablet Take 81 mg by mouth 2 (two) times a week.       . calcium carbonate (OS-CAL) 600 MG TABS Take 600 mg by mouth daily.        [name] estradiol (VIVELLE-DOT) 0.05 MG/24HR Place 1 patch onto the skin once a week.        . fish oil-omega-3 fatty acids 1000 MG capsule Take 1 g by mouth daily.        [name] levothyroxine (SYNTHROID, LEVOTHROID) 200 MCG tablet Take 200 mcg by mouth daily.        . montelukast (SINGULAIR) 10 MG tablet Take 10 mg by mouth at bedtime.        . Multiple Vitamin (MULTIVITAMIN) tablet Take 1 tablet by mouth daily.        . NON FORMULARY Inject 0.4 application into the skin every hour. NOVOLOG PUMP       . Selenium 200 MCG TABS Take 1 tablet by mouth daily.         No current facility-administered medications on file prior to visit.   She is allergic to clearsil..  Review of Systems Review of Systems  Constitutional: Negative for activity change, appetite change and fatigue.  HENT: Negative for hearing loss, congestion, tinnitus and ear discharge.  dentist q51m Eyes: Negative for visual disturbance (see optho q1y -- vision corrected to 20/20 with glasses).  Respiratory: Negative for cough, chest tightness and shortness of breath.   Cardiovascular: Negative for chest  pain, palpitations and leg swelling.  Gastrointestinal: Negative for abdominal pain, diarrhea, constipation and abdominal distention.  Genitourinary: Negative for urgency, frequency, decreased urine volume and difficulty urinating.  Musculoskeletal: Negative for back pain, arthralgias and gait problem.  Skin: Negative for color change, pallor and rash.  Neurological: Negative for dizziness, light-headedness, numbness and headaches.  Hematological: Negative for adenopathy. Does not bruise/bleed easily.  Psychiatric/Behavioral: Negative for suicidal ideas, confusion, sleep disturbance, self-injury, dysphoric mood, decreased concentration and agitation.      \  Objective:    BP 134/60  Pulse 72  Temp(Src) 97.9 F (36.6 C) (Oral)  Ht 5' 1.25" (1.556 m)  Wt 127 lb 6.4 oz (57.788 kg)  BMI 23.87 kg/m2  SpO2 99% General appearance: alert, cooperative, appears stated age and no distress Head: Normocephalic, without obvious abnormality, atraumatic Eyes: conjunctivae/corneas clear. PERRL, EOM's intact. Fundi benign. Ears: normal TM's and external ear canals both ears Nose: Nares normal. Septum midline. Mucosa normal. No drainage or sinus tenderness. Throat: lips, mucosa, and tongue normal; teeth and gums normal Neck: no adenopathy, no carotid bruit, no JVD, supple, symmetrical, trachea midline and thyroid not enlarged, symmetric, no tenderness/mass/nodules Back: symmetric, no curvature. ROM normal. No CVA tenderness. Lungs: clear to auscultation bilaterally Breasts: gyn Heart: regular rate and rhythm, S1, S2 normal, no murmur, click, rub or gallop Abdomen: soft, non-tender; bowel sounds normal; no masses,  no organomegaly Pelvic: deferred--gyn Extremities: extremities normal, atraumatic, no cyanosis or edema Pulses: 2+ and symmetric Skin: Skin color, texture, turgor normal. No rashes or lesions Lymph nodes: Cervical, supraclavicular, and axillary nodes normal. Neurologic: Alert and oriented X 3, normal strength and tone. Normal symmetric reflexes. Normal coordination and gait Psych--no depression, anxiety      Assessment:    Healthy female exam.       Plan:    ghm utd Check labs See After Visit Summary for Counseling Recommendations   1. Preventative health care   - Basic metabolic panel - CBC with Differential - Hepatic function panel - Lipid panel - POCT urinalysis dipstick - Hemoglobin A1c - TSH  2. 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

## 2013-12-04 ENCOUNTER — Telehealth: Payer: Self-pay

## 2013-12-04 NOTE — Telephone Encounter (Signed)
Relevant patient education assigned to patient using Emmi. ° °

## 2013-12-17 ENCOUNTER — Emergency Department
Admission: EM | Admit: 2013-12-17 | Discharge: 2013-12-17 | Disposition: A | Discharge status: Home / Self Care | Payer: 59 | Source: Home / Self Care | Attending: Emergency Medicine | Admitting: Emergency Medicine

## 2013-12-17 ENCOUNTER — Encounter: Payer: Self-pay | Admitting: Emergency Medicine

## 2013-12-17 DIAGNOSIS — M25539 Pain in unspecified wrist: Secondary | ICD-10-CM

## 2013-12-17 DIAGNOSIS — M25532 Pain in left wrist: Secondary | ICD-10-CM

## 2013-12-17 DIAGNOSIS — M25531 Pain in right wrist: Secondary | ICD-10-CM

## 2013-12-17 MED ORDER — PREDNISONE (PAK) 10 MG PO TABS: ORAL_TABLET | Freq: Every day | ORAL | Status: DC

## 2013-12-17 NOTE — ED Provider Notes (Signed)
CSN: [id number]     Arrival date & time 12/17/13  1137 History   First MD Initiated Contact with Patient 12/17/13 1214     Chief Complaint  Patient presents with  . Hand Problem    numbness   (Consider location/radiation/quality/duration/timing/severity/associated sxs/prior Treatment) HPI Patient reports bilateral hand pain with some numbness and tingling for the last 2 days.  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  She does have a history of bilateral wrist fractures in the remote past but did not fall or reinjure anything.  The pain is dull and mild during the day and worsens at night.  She has wrist splints that she only used one night with some Advil and it did not seem to help.  It does radiate somewhat up her elbow but is most located in her thumb and index finger bilaterally, right greater the left.  She feels a swollen sensation and some stiffness.  She has no worked out since the pain started.  Past Medical History  Diagnosis Date  . Diabetes mellitus     Type 1  . Asthma   . Allergy   . Thyroid disease     Hypothyroidism   Past Surgical History  Procedure Laterality Date  . Tonsilectomy, adenoidectomy, bilateral myringotomy and tubes    . Appendectomy    . Shoulder arthroscopy  2004    b/L --second one 2011  . Tubal ligation      then reversed it  . Ruptured ovarian cyst    . Nasal sinus surgery     Family History  Problem Relation Age of Onset  . Cancer Mother     cervical  . Hypertension Mother   . Hyperlipidemia Mother   . Heart disease Father 50    MI---- cabg before that  . Hypothyroidism Sister   . Heart disease Brother 54    MI--- second one 64  . Hyperlipidemia Brother   . Hypertension Brother   . Hypothyroidism Sister    History  Substance Use Topics  . Smoking status: Never Smoker   . Smokeless tobacco: Never Used  . Alcohol Use: No   OB History   Grav Para Term Preterm  Abortions TAB SAB Ect Mult Living                 Review of Systems  All other systems reviewed and are negative.   Allergies  Clearsil  Home Medications   Prior to Admission medications   Medication Sig Start Date End Date Taking? Authorizing Provider  aspirin 81 MG tablet Take 81 mg by mouth 2 (two) times a week.     Historical Provider, MD  calcium carbonate (OS-CAL) 600 MG TABS Take 600 mg by mouth daily.      Historical Provider, MD  estradiol (VIVELLE-DOT) 0.05 MG/24HR Place 1 patch onto the skin once a week.      Historical Provider, MD  fish oil-omega-3 fatty acids 1000 MG capsule Take 1 g by mouth daily.      Historical Provider, MD  insulin aspart (NOVOLOG) 100 UNIT/ML injection Per Dr Forde Dandy 11/21/13   Rosalita Chessman, DO  levothyroxine (SYNTHROID, LEVOTHROID) 200 MCG tablet Take 200 mcg by mouth daily.      Historical Provider, MD  montelukast (SINGULAIR) 10 MG tablet Take 10 mg by mouth at bedtime.      Historical Provider, MD  Multiple Vitamin (MULTIVITAMIN) tablet  Take 1 tablet by mouth daily.      Historical Provider, MD  NON FORMULARY Inject 0.4 application into the skin every hour. [place]     Historical Provider, MD  predniSONE (STERAPRED UNI-PAK) 10 MG tablet Take by mouth daily. 6 day pack, use as directed 12/17/13   Janeann Forehand, MD  Selenium 200 MCG TABS Take 1 tablet by mouth daily.      Historical Provider, MD   BP 146/80  Pulse 60  Temp(Src) 97.6 F (36.4 C) (Oral)  Ht 5\' 1"  (1.549 m)  Wt 124 lb 12 oz (56.586 kg)  BMI 23.58 kg/m2  SpO2 98% Physical Exam  Nursing note and vitals reviewed. Constitutional: She is oriented to person, place, and time. She appears well-developed and well-nourished.  HENT:  Head: Normocephalic and atraumatic.  Eyes: No scleral icterus.  Neck: Neck supple.  Cardiovascular: Regular rhythm and normal heart sounds.   Pulmonary/Chest: Effort normal and breath sounds normal. No respiratory distress.  Musculoskeletal:   Bilateral hands wrist examination demonstrates no tenderness, full range of motion, distal neurovascular status is intact.  She has a positive Phalen's test and a positive Tinel's test at the wrist.  Testing of the median nerve demonstrates normal movement and sensation and no weakness.  Anterior interosseous nerve is functioning.  Flexion-extension of the wrist do not exacerbate her pain.  Testing of the radial nerve demonstrates normal dorsal sensation and normal extension.  Pushing the ulnar nerve demonstrates normal ulnar sensation, negative Froment's test, normal grip.  Neurological: She is alert and oriented to person, place, and time.  Skin: Skin is warm and dry.  Psychiatric: She has a normal mood and affect. Her speech is normal.    ED Course  Procedures (including critical care time) Labs Review Labs Reviewed - No data to display  Imaging Review No results found.   MDM   1. Bilateral wrist pain   Patient has clinical symptoms consistent with carpal tunnel syndrome (versus pronator syndrome).  She has wrist splints at home and is advised to wear these at night.  I also gave her prescription of prednisone.  She is diabetic and understands that she needs to adjust her insulin pump and check her blood sugar more often.  She has done this before for sinus infections and understands how to do this.  ER precautions are given for sugar >400.  She should notice improvement within a week.  If she does not, she will need followup with her primary care physician to consider a referral to a neurologist for EMG/NCV to confirm diagnosis.  Alternatives would be to see sports medicine for an ultrasound-guided injection  of the carpal tunnel to see if that helps.  Patient with no injury or symptoms of acute issue, so no Xray was done today.    Janeann Forehand, MD 12/17/13 1228

## 2013-12-17 NOTE — ED Notes (Signed)
Pt complains of both hands hurting with tingling and numbness.  Pain radiates up her hands to her elbow.  Described as swollen feeling and stiff.  Pian 3/10 but 8/10 at night. Pt broke both wrists over 20 years ago.

## 2014-02-11 ENCOUNTER — Encounter: Payer: Self-pay | Admitting: Emergency Medicine

## 2014-02-11 ENCOUNTER — Emergency Department
Admission: EM | Admit: 2014-02-11 | Discharge: 2014-02-11 | Disposition: A | Discharge status: Home / Self Care | Payer: 59 | Source: Home / Self Care | Attending: Family Medicine | Admitting: Family Medicine

## 2014-02-11 DIAGNOSIS — B9789 Other viral agents as the cause of diseases classified elsewhere: Secondary | ICD-10-CM

## 2014-02-11 DIAGNOSIS — J029 Acute pharyngitis, unspecified: Secondary | ICD-10-CM

## 2014-02-11 DIAGNOSIS — J069 Acute upper respiratory infection, unspecified: Secondary | ICD-10-CM

## 2014-02-11 LAB — POCT RAPID STREP A (OFFICE): Rapid Strep A Screen: NEGATIVE

## 2014-02-11 MED ORDER — AZITHROMYCIN 250 MG PO TABS: ORAL_TABLET | ORAL | Status: DC

## 2014-02-11 MED ORDER — BENZONATATE 200 MG PO CAPS: 200.0000 mg | ORAL_CAPSULE | Freq: Every day | ORAL | Status: DC

## 2014-02-11 NOTE — ED Provider Notes (Signed)
CSN: [id number]     Arrival date & time 02/11/14  1142 History   First MD Initiated Contact with Patient 02/11/14 1229     Chief Complaint  Patient presents with  . Sore Throat     HPI Comments: Two days ago patient developed sore throat, headache, bilateral earache, chills, myalgias, and low grade fever to 99.2.  She has an occasional cough but minimal nasal congestion. She has a history of exercise induced asthma and seasonal allergies, but denies wheezing or shortness of breath.  She uses Advair during her allergy seasons.  The history is provided by the patient.    Past Medical History  Diagnosis Date  . Diabetes mellitus     Type 1  . Asthma   . Allergy   . Thyroid disease     Hypothyroidism   Past Surgical History  Procedure Laterality Date  . Tonsilectomy, adenoidectomy, bilateral myringotomy and tubes    . Appendectomy    . Shoulder arthroscopy  2004    b/L --second one 2011  . Tubal ligation      then reversed it  . Ruptured ovarian cyst    . Nasal sinus surgery     Family History  Problem Relation Age of Onset  . Cancer Mother     cervical  . Hypertension Mother   . Hyperlipidemia Mother   . Heart disease Father 24    MI---- cabg before that  . Hypothyroidism Sister   . Heart disease Brother 69    MI--- second one 30  . Hyperlipidemia Brother   . Hypertension Brother   . Hypothyroidism Sister    History  Substance Use Topics  . Smoking status: Never Smoker   . Smokeless tobacco: Never Used  . Alcohol Use: No   OB History   Grav Para Term Preterm Abortions TAB SAB Ect Mult Living                 Review of Systems + sore throat + cough No pleuritic pain No wheezing + nasal congestion + post-nasal drainage No sinus pain/pressure No itchy/red eyes ? earache No hemoptysis No SOB + fever, + chills No nausea No vomiting No abdominal pain No diarrhea No urinary symptoms No skin rash + fatigue + myalgias + headache Used OTC meds without  relief   Allergies  Clearsil  Home Medications   Prior to Admission medications   Medication Sig Start Date End Date Taking? Authorizing Provider  aspirin 81 MG tablet Take 81 mg by mouth 2 (two) times a week.     Historical Provider, MD  azithromycin (ZITHROMAX Z-PAK) 250 MG tablet Take 2 tabs today; then begin one tab once daily for 4 more days. (Rx void after 02/19/14) 02/11/14   Kandra Nicolas, MD  benzonatate (TESSALON) 200 MG capsule Take 1 capsule (200 mg total) by mouth at bedtime. Take as needed for cough 02/11/14   Kandra Nicolas, MD  calcium carbonate (OS-CAL) 600 MG TABS Take 600 mg by mouth daily.      Historical Provider, MD  estradiol (VIVELLE-DOT) 0.05 MG/24HR Place 1 patch onto the skin once a week.      Historical Provider, MD  fish oil-omega-3 fatty acids 1000 MG capsule Take 1 g by mouth daily.      Historical Provider, MD  insulin aspart (NOVOLOG) 100 UNIT/ML injection Per Dr Forde Dandy 11/21/13   Rosalita Chessman, DO  levothyroxine (SYNTHROID, LEVOTHROID) 200 MCG tablet Take 200 mcg by mouth  daily.      Historical Provider, MD  montelukast (SINGULAIR) 10 MG tablet Take 10 mg by mouth at bedtime.      Historical Provider, MD  Multiple Vitamin (MULTIVITAMIN) tablet Take 1 tablet by mouth daily.      Historical Provider, MD  NON FORMULARY Inject 0.4 application into the skin every hour. [place]     Historical Provider, MD  Selenium 200 MCG TABS Take 1 tablet by mouth daily.      Historical Provider, MD   BP 125/76  Pulse 74  Temp(Src) 98.3 F (36.8 C) (Oral)  Ht 5\' 1"  (1.549 m)  Wt 127 lb (57.607 kg)  BMI 24.01 kg/m2  SpO2 98% Physical Exam Nursing notes and Vital Signs reviewed. Appearance:  Patient appears healthy, stated age, and in no acute distress Eyes:  Pupils are equal, round, and reactive to light and accomodation.  Extraocular movement is intact.  Conjunctivae are not inflamed  Ears:  Canals normal.  Tympanic membranes normal.  Nose:  Mildly congested  turbinates.  No sinus tenderness.   Pharynx:  Mildly erythematous Neck:  Supple.   Tender enlarged posterior nodes are palpated bilaterally  Lungs:  Clear to auscultation.  Breath sounds are equal.  Heart:  Regular rate and rhythm without murmurs, rubs, or gallops.  Abdomen:  Nontender without masses or hepatosplenomegaly.  Bowel sounds are present.  No CVA or flank tenderness.  Extremities:  No edema.  No calf tenderness Skin:  No rash present.   ED Course  Procedures  None    Labs Reviewed  POCT RAPID STREP A (OFFICE) negative         MDM   1. Acute pharyngitis, unspecified pharyngitis type   2. Viral URI with cough    There is no evidence of bacterial infection today.  Throat culture pending. Take plain Mucinex (1200 mg guaifenesin) twice daily for cough and congestion.  May add Sudafed for sinus congestion.   Increase fluid intake, rest. May use Afrin nasal spray (or generic oxymetazoline) twice daily for about 5 days.  Also recommend using saline nasal spray several times daily and saline nasal irrigation (AYR is a common brand) Try warm salt water gargles for sore throat.  Continue Singulair. May resume Advair if wheezing develops Stop all antihistamines for now, and other non-prescription cough/cold preparations. May take Ibuprofen 200mg , 4 tabs every 8 hours with food for headache, sore throat, etc. Begin Azithromycin if not improving about one week or if persistent fever develops (Given a prescription to hold, with an expiration date)  Follow-up with family doctor if not improving about10 days.     Kandra Nicolas, MD 02/11/14 [phone]

## 2014-02-11 NOTE — ED Notes (Signed)
Pt complains of sore throat for 3 days.  Pt was exposed to strep this past week at work.  Pt also has experienced chills, ear pain, headache, no appetite, and low grade fever.

## 2014-02-11 NOTE — Discharge Instructions (Signed)
Take plain Mucinex (1200 mg guaifenesin) twice daily for cough and congestion.  May add Sudafed for sinus congestion.   Increase fluid intake, rest. May use Afrin nasal spray (or generic oxymetazoline) twice daily for about 5 days.  Also recommend using saline nasal spray several times daily and saline nasal irrigation (AYR is a common brand) Try warm salt water gargles for sore throat.  Continue Singulair. May resume Advair if wheezing develops Stop all antihistamines for now, and other non-prescription cough/cold preparations. May take Ibuprofen 200mg , 4 tabs every 8 hours with food for headache, sore throat, etc. Begin Azithromycin if not improving about one week or if persistent fever develops   Follow-up with family doctor if not improving about10 days.

## 2014-02-12 ENCOUNTER — Telehealth: Payer: Self-pay

## 2014-02-12 LAB — STREP A DNA PROBE: GASP: NEGATIVE

## 2014-02-12 NOTE — ED Notes (Signed)
Left a message on voice mail asking how patient is feeling and advising to call back with any questions or concerns. Also, left message advising negative throat culture.

## 2014-04-07 ENCOUNTER — Telehealth: Payer: Self-pay

## 2014-04-07 NOTE — Telephone Encounter (Signed)
Pt stated that they see another MD for DM care. Dr. Forde Dandy is pt DM MD  Diabetic bundle pt.

## 2014-06-26 ENCOUNTER — Encounter: Payer: Self-pay | Admitting: Family Medicine

## 2014-07-11 ENCOUNTER — Encounter: Payer: Self-pay | Admitting: Medical

## 2014-07-11 ENCOUNTER — Ambulatory Visit (INDEPENDENT_AMBULATORY_CARE_PROVIDER_SITE_OTHER): Payer: 59 | Admitting: Medical

## 2014-07-11 VITALS — BP 155/80 | HR 64 | Temp 98.0°F | Ht 61.2 in | Wt 131.2 lb

## 2014-07-11 DIAGNOSIS — R062 Wheezing: Secondary | ICD-10-CM | POA: Insufficient documentation

## 2014-07-11 DIAGNOSIS — J0100 Acute maxillary sinusitis, unspecified: Secondary | ICD-10-CM | POA: Insufficient documentation

## 2014-07-11 DIAGNOSIS — J0101 Acute recurrent maxillary sinusitis: Secondary | ICD-10-CM

## 2014-07-11 MED ORDER — PREDNISONE 20 MG PO TABS: ORAL_TABLET | ORAL | Status: DC

## 2014-07-11 MED ORDER — HYDROCODONE-HOMATROPINE 5-1.5 MG/5ML PO SYRP: 5.0000 mL | ORAL_SOLUTION | Freq: Three times a day (TID) | ORAL | Status: DC | PRN

## 2014-07-11 MED ORDER — AMOXICILLIN-POT CLAVULANATE 875-125 MG PO TABS: 1.0000 | ORAL_TABLET | Freq: Two times a day (BID) | ORAL | Status: DC

## 2014-07-11 NOTE — Progress Notes (Signed)
Subjective:    Patient ID: Tiffany Browning, female    DOB: 26-Apr-1956, 58 y.o.   MRN: [id number]  HPI   Tiffany Browning in for nasal congested and coughing x 2 days. She describes nasal and chest congestion. Dry cough. Mild chills but no fevers. Some mild sweat at night. Not hot flashes.  Hx of asthma. Last couple of days wheezing. On advair. No history of smoking.   Tiffany Browning is diabetic and she has insulin pump.  Past Medical History  Diagnosis Date  . Diabetes mellitus     Type 1  . Asthma   . Allergy   . Thyroid disease     Hypothyroidism    History   Social History  . Marital Status: Married    Spouse Name: N/A    Number of Children: N/A  . Years of Education: N/A   Occupational History  . 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 Smoking status: Never Smoker   . Smokeless tobacco: Never Used  . Alcohol Use: No  . Drug Use: No  . Sexual Activity:    Partners: Male   Other Topics Concern  . Not on file   Social History Narrative    Past Surgical History  Procedure Laterality Date  . Tonsilectomy, adenoidectomy, bilateral myringotomy and tubes    . Appendectomy    . Shoulder arthroscopy  2004    b/L --second one 2011  . Tubal ligation      then reversed it  . Ruptured ovarian cyst    . Nasal sinus surgery      Family History  Problem Relation Age of Onset  . Cancer Mother     cervical  . Hypertension Mother   . Hyperlipidemia Mother   . Heart disease Father 67    MI---- cabg before that  . Hypothyroidism Sister   . Heart disease Brother 70    MI--- second one 65  . Hyperlipidemia Brother   . Hypertension Brother   . Hypothyroidism Sister     Allergies  Allergen Reactions  . Clearsil [Benzoyl Peroxide] Rash    Current Outpatient Prescriptions on File Prior to Visit  Medication Sig Dispense Refill  . aspirin 81 MG tablet Take 81 mg by mouth 2 (two) times a week.     . calcium carbonate (OS-CAL) 600 MG TABS Take 600 mg by  mouth daily.      [name] estradiol (VIVELLE-DOT) 0.05 MG/24HR Place 1 patch onto the skin once a week.      . fish oil-omega-3 fatty acids 1000 MG capsule Take 1 g by mouth daily.      . insulin aspart (NOVOLOG) 100 UNIT/ML injection Per Dr Forde Dandy 3 vial PRN  . levothyroxine (SYNTHROID, LEVOTHROID) 200 MCG tablet Take 200 mcg by mouth daily.      . montelukast (SINGULAIR) 10 MG tablet Take 10 mg by mouth at bedtime.      . Multiple Vitamin (MULTIVITAMIN) tablet Take 1 tablet by mouth daily.      . NON FORMULARY Inject 0.4 application into the skin every hour. NOVOLOG PUMP      No current facility-administered medications on file prior to visit.    BP 155/80 mmHg  Pulse 64  Temp(Src) 98 F (36.7 C) (Oral)  Ht 5' 1.2" (1.554 m)  Wt 131 lb 3.2 oz (59.512 kg)  BMI 24.64 kg/m2  SpO2 98%     Review of Systems  Constitutional: Positive for diaphoresis.  Negative for fever, chills and fatigue.  HENT: Positive for congestion. Negative for ear discharge, ear pain, nosebleeds, postnasal drip, rhinorrhea, sinus pressure, sore throat and trouble swallowing.   Respiratory: Positive for cough and wheezing. Negative for chest tightness and shortness of breath.   Cardiovascular: Negative for chest pain and palpitations.  Gastrointestinal: Negative for nausea, vomiting, abdominal pain, diarrhea and constipation.  Endocrine: Negative for polydipsia, polyphagia and polyuria.  Genitourinary: Negative for dysuria and flank pain.  Musculoskeletal: Negative for back pain.  Neurological: Negative for dizziness, weakness and headaches.  Hematological: Negative for adenopathy. Does not bruise/bleed easily.       Objective:   Physical Exam   General  Mental Status - Alert. General Appearance - Well groomed. Not in acute distress.  Skin Rashes- No Rashes.  HEENT Head- Normal. Ear Auditory Canal - Left- Normal. Right - Normal.Tympanic Membrane- Left- Normal. Right- Normal. Eye Sclera/Conjunctiva-  Left- Normal. Right- Normal. Nose & Sinuses Nasal Mucosa- Left-  Boggy + Congested. Right-  Boggy + Congested. Frontal and maxillary sinus pressure to palpation Mouth & Throat Lips: Upper Lip- Normal: no dryness, cracking, pallor, cyanosis, or vesicular eruption. Lower Lip-Normal: no dryness, cracking, pallor, cyanosis or vesicular eruption. Buccal Mucosa- Bilateral- No Aphthous ulcers. Oropharynx- No Discharge or Erythema. Tonsils: Characteristics- Bilateral- No Erythema or Congestion. Size/Enlargement- Bilateral- No enlargement. Discharge- bilateral-None.  Neck Neck- Supple. No Masses.   Chest and Lung Exam Auscultation: Breath Sounds:-Normal. Even and unlabored but scattered wheezing throughout.  Cardiovascular Auscultation:Rythm- Regular. Rate and rhythm. Murmurs & Other Heart Sounds:Ausculatation of the heart reveal- No Murmurs.  Lymphatic Head & Neck General Head & Neck Lymphatics: Bilateral: Description- No Localized lymphadenopathy.           Assessment & Plan:

## 2014-07-11 NOTE — Assessment & Plan Note (Signed)
Patient  appears to have probable sinusitis and bronchitis.   I am prescribing you hydromet for cough,  and augmentin antibiotic.

## 2014-07-11 NOTE — Progress Notes (Signed)
Pre visit review using our clinic review tool, if applicable. No additional management support is needed unless otherwise documented below in the visit note. 

## 2014-07-11 NOTE — Assessment & Plan Note (Signed)
Patient is made aware as expressed in the AVS that if she wheezes despite the use of Advair then she can use a 3 day tapered dose of prednisone. She will use this only if needed since she is diabetic.

## 2014-07-11 NOTE — Patient Instructions (Addendum)
You do appear to have probable sinusitis and bronchitis. Also your asthma appears to be staring to flair.  I am prescribing you hydromet for cough,  augmentin antibiotic and continue your advair.  Patient was advised to use Advair for her wheezing. However since she is already wheezing will go ahead and make a 3 day tapered prescription of prednisone available. She'll use this if she worsens. We'll proceed with caution since she is a diabetic and on insulin. Patient expressed understanding.  Follow up in 7 days or as needed.

## 2014-07-17 ENCOUNTER — Telehealth: Payer: Self-pay | Admitting: Family Medicine

## 2014-07-17 MED ORDER — FLUCONAZOLE 150 MG PO TABS: ORAL_TABLET | ORAL | Status: DC

## 2014-07-17 NOTE — Telephone Encounter (Signed)
Diflucan 150 mg #2  1 po qd x1, may repeat 3 days

## 2014-07-17 NOTE — Telephone Encounter (Signed)
Please advise, patient was seen 07/11/14 by Percell Miller.      KP

## 2014-07-17 NOTE — Telephone Encounter (Signed)
Caller name: [place], [place] Relation to pt: self  Call back number: [phone]   Reason for call:  Due to AUGMENTIN) 875-125 MG per tablet AUGMENTIN) 875-125 MG per tablet pt is expercicing a yeast infection

## 2014-07-17 NOTE — Telephone Encounter (Signed)
Patient is aware that the Rx was sent.     KP

## 2015-02-11 ENCOUNTER — Other Ambulatory Visit: Payer: Self-pay

## 2015-06-10 ENCOUNTER — Other Ambulatory Visit (HOSPITAL_BASED_OUTPATIENT_CLINIC_OR_DEPARTMENT_OTHER): Payer: Self-pay | Admitting: Nurse Practitioner

## 2015-06-10 DIAGNOSIS — Z1231 Encounter for screening mammogram for malignant neoplasm of breast: Secondary | ICD-10-CM

## 2015-06-11 LAB — HEMOGLOBIN A1C: Hgb A1c MFr Bld: 6.8 % — AB (ref 4.0–6.0)

## 2015-06-11 LAB — MICROALBUMIN, URINE: Microalb, Ur: 12

## 2015-06-18 ENCOUNTER — Ambulatory Visit (HOSPITAL_BASED_OUTPATIENT_CLINIC_OR_DEPARTMENT_OTHER)
Admission: RE | Admit: 2015-06-18 | Discharge: 2015-06-18 | Disposition: A | Discharge status: Home / Self Care | Payer: 59 | Source: Ambulatory Visit | Attending: Nurse Practitioner | Admitting: Nurse Practitioner

## 2015-06-18 DIAGNOSIS — Z1231 Encounter for screening mammogram for malignant neoplasm of breast: Secondary | ICD-10-CM | POA: Insufficient documentation

## 2015-07-24 ENCOUNTER — Encounter: Payer: Self-pay | Admitting: Certified Registered Nurse Anesthetist

## 2015-07-24 ENCOUNTER — Telehealth: Payer: Self-pay | Admitting: Certified Registered Nurse Anesthetist

## 2015-07-24 LAB — MEASURE BLOOD PRESSURE

## 2015-07-24 LAB — BLOOD PRESSURE CHECK BP00

## 2015-07-24 NOTE — Telephone Encounter (Signed)
Patient declines wanting to see PCP at this time.  States diabetes is handled by Dr. Forde Dandy at [facility].  Requested those medical records.   Needs BP/A1C/Micro Albumin updated in chart.

## 2015-12-03 ENCOUNTER — Other Ambulatory Visit: Payer: Self-pay | Admitting: Nurse Practitioner

## 2015-12-03 ENCOUNTER — Other Ambulatory Visit (HOSPITAL_COMMUNITY)
Admission: RE | Admit: 2015-12-03 | Discharge: 2015-12-03 | Disposition: A | Discharge status: Home / Self Care | Payer: 59 | Source: Ambulatory Visit | Attending: Nurse Practitioner | Admitting: Nurse Practitioner

## 2015-12-03 DIAGNOSIS — Z1151 Encounter for screening for human papillomavirus (HPV): Secondary | ICD-10-CM | POA: Diagnosis present

## 2015-12-03 DIAGNOSIS — Z01419 Encounter for gynecological examination (general) (routine) without abnormal findings: Secondary | ICD-10-CM | POA: Diagnosis present

## 2015-12-05 LAB — CYTOLOGY - PAP

## 2015-12-11 ENCOUNTER — Other Ambulatory Visit: Payer: Self-pay | Admitting: Nurse Practitioner

## 2015-12-11 DIAGNOSIS — N6001 Solitary cyst of right breast: Secondary | ICD-10-CM

## 2015-12-17 ENCOUNTER — Other Ambulatory Visit: Payer: 59

## 2015-12-20 ENCOUNTER — Ambulatory Visit
Admission: RE | Admit: 2015-12-20 | Discharge: 2015-12-20 | Disposition: A | Discharge status: Home / Self Care | Payer: 59 | Source: Ambulatory Visit | Attending: Nurse Practitioner | Admitting: Nurse Practitioner

## 2015-12-20 DIAGNOSIS — N6001 Solitary cyst of right breast: Secondary | ICD-10-CM

## 2015-12-31 ENCOUNTER — Other Ambulatory Visit: Payer: Self-pay | Admitting: Nurse Practitioner

## 2015-12-31 ENCOUNTER — Ambulatory Visit
Admission: RE | Admit: 2015-12-31 | Discharge: 2015-12-31 | Disposition: A | Discharge status: Home / Self Care | Payer: 59 | Source: Ambulatory Visit

## 2015-12-31 ENCOUNTER — Ambulatory Visit: Admission: RE | Admit: 2015-12-31 | Payer: 59 | Source: Ambulatory Visit

## 2015-12-31 ENCOUNTER — Ambulatory Visit
Admission: RE | Admit: 2015-12-31 | Discharge: 2015-12-31 | Disposition: A | Discharge status: Home / Self Care | Payer: 59 | Source: Ambulatory Visit | Attending: Nurse Practitioner | Admitting: Nurse Practitioner

## 2015-12-31 DIAGNOSIS — N6310 Unspecified lump in the right breast, unspecified quadrant: Secondary | ICD-10-CM

## 2015-12-31 DIAGNOSIS — N6001 Solitary cyst of right breast: Secondary | ICD-10-CM

## 2016-01-07 ENCOUNTER — Other Ambulatory Visit: Payer: 59

## 2016-01-09 ENCOUNTER — Ambulatory Visit
Admission: RE | Admit: 2016-01-09 | Discharge: 2016-01-09 | Disposition: A | Discharge status: Home / Self Care | Payer: 59 | Source: Ambulatory Visit | Attending: Nurse Practitioner | Admitting: Nurse Practitioner

## 2016-01-09 ENCOUNTER — Other Ambulatory Visit: Payer: Self-pay | Admitting: Nurse Practitioner

## 2016-01-09 DIAGNOSIS — N6310 Unspecified lump in the right breast, unspecified quadrant: Secondary | ICD-10-CM

## 2016-01-09 HISTORY — PX: BREAST BIOPSY: SHX20

## 2016-01-21 ENCOUNTER — Telehealth: Payer: Self-pay | Admitting: Oncology

## 2016-01-21 ENCOUNTER — Encounter: Payer: Self-pay | Admitting: Oncology

## 2016-01-21 ENCOUNTER — Other Ambulatory Visit: Payer: Self-pay | Admitting: General Surgery

## 2016-01-21 DIAGNOSIS — C50411 Malignant neoplasm of upper-outer quadrant of right female breast: Secondary | ICD-10-CM

## 2016-01-21 NOTE — Telephone Encounter (Signed)
Appointment scheduled with [name] on 6/13 at 4:30. Patient agreed and demographics verified. Letter sent to the referring.

## 2016-01-27 ENCOUNTER — Other Ambulatory Visit: Payer: Self-pay | Admitting: *Deleted

## 2016-01-27 DIAGNOSIS — C50919 Malignant neoplasm of unspecified site of unspecified female breast: Secondary | ICD-10-CM

## 2016-01-28 ENCOUNTER — Ambulatory Visit (INDEPENDENT_AMBULATORY_CARE_PROVIDER_SITE_OTHER): Payer: 59 | Admitting: Medical

## 2016-01-28 ENCOUNTER — Other Ambulatory Visit (HOSPITAL_COMMUNITY)
Admission: RE | Admit: 2016-01-28 | Discharge: 2016-01-28 | Disposition: A | Discharge status: Home / Self Care | Payer: 59 | Source: Ambulatory Visit | Attending: Medical | Admitting: Medical

## 2016-01-28 ENCOUNTER — Other Ambulatory Visit (HOSPITAL_BASED_OUTPATIENT_CLINIC_OR_DEPARTMENT_OTHER): Payer: 59

## 2016-01-28 ENCOUNTER — Ambulatory Visit (HOSPITAL_BASED_OUTPATIENT_CLINIC_OR_DEPARTMENT_OTHER): Payer: 59 | Admitting: Oncology

## 2016-01-28 ENCOUNTER — Encounter: Payer: Self-pay | Admitting: Medical

## 2016-01-28 VITALS — BP 145/63 | HR 54 | Temp 97.8°F | Resp 18 | Ht 61.25 in | Wt 127.9 lb

## 2016-01-28 VITALS — BP 120/80 | HR 62 | Temp 98.2°F | Ht 61.25 in | Wt 128.8 lb

## 2016-01-28 DIAGNOSIS — N760 Acute vaginitis: Secondary | ICD-10-CM | POA: Insufficient documentation

## 2016-01-28 DIAGNOSIS — C50919 Malignant neoplasm of unspecified site of unspecified female breast: Secondary | ICD-10-CM

## 2016-01-28 DIAGNOSIS — R300 Dysuria: Secondary | ICD-10-CM | POA: Diagnosis not present

## 2016-01-28 DIAGNOSIS — C50411 Malignant neoplasm of upper-outer quadrant of right female breast: Secondary | ICD-10-CM | POA: Diagnosis not present

## 2016-01-28 DIAGNOSIS — Z170 Estrogen receptor positive status [ER+]: Secondary | ICD-10-CM | POA: Diagnosis not present

## 2016-01-28 LAB — COMPREHENSIVE METABOLIC PANEL
ALT: 38 U/L (ref 0–55)
AST: 35 U/L — ABNORMAL HIGH (ref 5–34)
Albumin: 3.9 g/dL (ref 3.5–5.0)
Alkaline Phosphatase: 101 U/L (ref 40–150)
Anion Gap: 6 mEq/L (ref 3–11)
BUN: 17.6 mg/dL (ref 7.0–26.0)
CO2: 29 mEq/L (ref 22–29)
Calcium: 9.4 mg/dL (ref 8.4–10.4)
Chloride: 104 mEq/L (ref 98–109)
Creatinine: 1 mg/dL (ref 0.6–1.1)
EGFR: 63 mL/min/{1.73_m2} — ABNORMAL LOW (ref >90)
Glucose: 129 mg/dl (ref 70–140)
Potassium: 4.3 mEq/L (ref 3.5–5.1)
Sodium: 139 mEq/L (ref 136–145)
Total Bilirubin: 0.31 mg/dL (ref 0.20–1.20)
Total Protein: 7.6 g/dL (ref 6.4–8.3)

## 2016-01-28 LAB — CBC WITH DIFFERENTIAL/PLATELET
BASO%: 1.4 % (ref 0.0–2.0)
Basophils Absolute: 0.1 10*3/uL (ref 0.0–0.1)
EOS%: 8.2 % — ABNORMAL HIGH (ref 0.0–7.0)
Eosinophils Absolute: 0.4 10*3/uL (ref 0.0–0.5)
HCT: 35.5 % (ref 34.8–46.6)
HGB: 11.7 g/dL (ref 11.6–15.9)
LYMPH%: 33.9 % (ref 14.0–49.7)
MCH: 30.6 pg (ref 25.1–34.0)
MCHC: 33 g/dL (ref 31.5–36.0)
MCV: 92.9 fL (ref 79.5–101.0)
MONO#: 0.6 10*3/uL (ref 0.1–0.9)
MONO%: 10.9 % (ref 0.0–14.0)
NEUT#: 2.3 10*3/uL (ref 1.5–6.5)
NEUT%: 45.6 % (ref 38.4–76.8)
Platelets: 333 10*3/uL (ref 145–400)
RBC: 3.82 10*6/uL (ref 3.70–5.45)
RDW: 12.7 % (ref 11.2–14.5)
WBC: 5.1 10*3/uL (ref 3.9–10.3)
lymph#: 1.7 10*3/uL (ref 0.9–3.3)

## 2016-01-28 LAB — POC URINALSYSI DIPSTICK (AUTOMATED)
Bilirubin, UA: NEGATIVE
Blood, UA: NEGATIVE
Glucose, UA: NEGATIVE
Ketones, UA: NEGATIVE
Leukocytes, UA: NEGATIVE
Nitrite, UA: NEGATIVE
Protein, UA: NEGATIVE
Spec Grav, UA: >=1.03
Urobilinogen, UA: 0.2
pH, UA: 5

## 2016-01-28 MED ORDER — TAMOXIFEN CITRATE 20 MG PO TABS: 20.0000 mg | ORAL_TABLET | Freq: Every day | ORAL | Status: AC

## 2016-01-28 MED ORDER — CIPROFLOXACIN HCL 250 MG PO TABS: 250.0000 mg | ORAL_TABLET | Freq: Two times a day (BID) | ORAL | Status: DC

## 2016-01-28 NOTE — Addendum Note (Signed)
Addended by: Anabel Halon on: 01/28/2016 08:42 AM   Modules accepted: Miquel Dunn

## 2016-01-28 NOTE — Progress Notes (Signed)
Pre visit review using our clinic review tool, if applicable. No additional management support is needed unless otherwise documented below in the visit note. 

## 2016-01-28 NOTE — Patient Instructions (Addendum)
Your urine looks clear but since you have had symptoms recently will still get uc. Also will include some ancillary studies.  Will make cipro low dose available for 3 days if your symptoms reoccur like uti. Otherwise will wait for urine culture and ancillary results.  Follow up in 7 days or as needed

## 2016-01-28 NOTE — Progress Notes (Addendum)
Subjective:    Patient ID: Tiffany Browning, female    DOB: 1955-10-20, 60 y.o.   MRN: [id number]  HPI  Pt in states yesterday she was burning on urination yesterday a lot. She hydrated a lot yesterday afternoon. This morning when she woke up felt better.  No fever, no chills or sweats. Pt states gets occasional rare uti.  Pt updates me that she has recently been diagnosed with breast cancer. She will see oncologist soon.    Review of Systems  Constitutional: Negative for fever and fatigue.  Respiratory: Negative for cough, chest tightness, shortness of breath and wheezing.   Cardiovascular: Negative for chest pain and palpitations.  Gastrointestinal: Negative for abdominal pain.  Genitourinary: Positive for dysuria. Negative for urgency, frequency, flank pain, difficulty urinating, vaginal pain and pelvic pain.  Neurological: Negative for dizziness, tremors, seizures, weakness and numbness.    Past Medical History  Diagnosis Date  . Diabetes mellitus     Type 1  . Asthma   . Allergy   . Thyroid disease     Hypothyroidism     Social History   Social History  . Marital Status: Married    Spouse Name: N/A  . Number of Children: N/A  . Years of Education: N/A   Occupational History  . 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 Smoking status: Never Smoker   . Smokeless tobacco: Never Used  . Alcohol Use: No  . Drug Use: No  . Sexual Activity:    Partners: Male   Other Topics Concern  . Not on file   Social History Narrative    Past Surgical History  Procedure Laterality Date  . Tonsilectomy, adenoidectomy, bilateral myringotomy and tubes    . Appendectomy    . Shoulder arthroscopy  2004    b/L --second one 2011  . Tubal ligation      then reversed it  . Ruptured ovarian cyst    . Nasal sinus surgery      Family History  Problem Relation Age of Onset  . Cancer Mother     cervical  . Hypertension Mother   .  Hyperlipidemia Mother   . Heart disease Father 34    MI---- cabg before that  . Hypothyroidism Sister   . Heart disease Brother 50    MI--- second one 21  . Hyperlipidemia Brother   . Hypertension Brother   . Hypothyroidism Sister     Allergies  Allergen Reactions  . Clearsil [Benzoyl Peroxide] Rash    Current Outpatient Prescriptions on File Prior to Visit  Medication Sig Dispense Refill  . fish oil-omega-3 fatty acids 1000 MG capsule Take 1 g by mouth daily.      . insulin aspart (NOVOLOG) 100 UNIT/ML injection Per Dr Forde Dandy 3 vial PRN  . levothyroxine (SYNTHROID, LEVOTHROID) 200 MCG tablet Take 200 mcg by mouth daily.      . montelukast (SINGULAIR) 10 MG tablet Take 10 mg by mouth at bedtime.      . Multiple Vitamin (MULTIVITAMIN) tablet Take 1 tablet by mouth daily.      . NON FORMULARY Inject 0.4 application into the skin every hour. NOVOLOG PUMP      No current facility-administered medications on file prior to visit.    BP 120/80 mmHg  Pulse 62  Temp(Src) 98.2 F (36.8 C) (Oral)  Ht 5' 1.25" (1.556 m)  Wt 128 lb 12.8 oz (58.423 kg)  BMI 24.13 kg/m2  SpO2 98%       Objective:   Physical Exam  General Appearance- Not in acute distress.  HEENT Eyes- Scleraeral/Conjuntiva-bilat- Not Yellow. Mouth & Throat- Normal.  Chest and Lung Exam Auscultation: Breath sounds:-Normal. Adventitious sounds:- No Adventitious sounds.  Cardiovascular Auscultation:Rythm - Regular. Heart Sounds -Normal heart sounds.  Abdomen Inspection:-Inspection Normal.  Palpation/Perucssion: Palpation and Percussion of the abdomen reveal- Non Tender suprapubic area, No Rebound tenderness, No rigidity(Guarding) and No Palpable abdominal masses.  Liver:-Normal.  Spleen:- Normal.   Back- No cva tenderness      Assessment & Plan:  Your urine looks clear but since you have had symptoms recently will still get uc. Also will include some ancillary studies.  Will make cipro low dose  available for 3 days if your symptoms reoccur like uti. Otherwise will wait for urine culture and ancillary results.  Follow up in 7 days or as needed  .Kimala Horne, Percell Miller, PA-C

## 2016-01-28 NOTE — Progress Notes (Signed)
[place]  Telephone:([phone]) [phone] Fax:(336) [phone]     ID: Tiffany Browning DOB: [birth year]  MR#: [id number]  DJS#:[id number]  Patient Care Team: [name], DO as PCP - General (Family Medicine) Reynold Bowen, MD as Consulting Physician (Endocrinology) Autumn Messing III, MD as Consulting Physician (General Surgery) Chauncey Cruel, MD as Consulting Physician (Oncology) [name], MD as Attending Physician (Radiation Oncology) PCP: [name], DO GYN: OTHER MD:  CHIEF COMPLAINT: Estrogen receptor positive breast cancer  CURRENT TREATMENT: Neoadjuvant tamoxifen   BREAST CANCER HISTORY: Tiffany Browning had routine physical exam by her gynecologist in May 2017 and she was able to palpate a mass in the upper-outer quadrant of her right breast. The patient had had bilateral screening mammography 06/26/2015 at the [facility], with no suspicious findings. On 12/31/2015 she underwent bilateral diagnostic mammography with tomography and right breast ultrasonography. The breast density was category D. On focal spot compression in the area of palpable concern there was a possible mass, hard to make out because of the breast density. On exam there was a firm palpable lump measuring approximately 3 cm at the 9:00 region of the right breast, 4 cm from the nipple. The overlying skin was unremarkable. Ultrasonography confirmed an irregular hypoechoic mass in the area in question measuring 3.0 cm.  Also, separate from the palpable mass at the 9:00 position but 2 cm from the nipple was a hypoechoic oval mass measuring 2.0 cm. Ultrasound of the right axilla was negative.  Biopsy of both the masses in question from the right rest were obtained 01/09/2016. This showed (SAA 17-90-2) the mass closer to the nipple, S2 centimeters, to be a fibroadenoma. The more distant mass, 4 cm from the nipple, was invasive ductal carcinoma, E-cadherin positive, grade 2, estrogen receptor 100%  positive, progesterone receptor 100% positive, with an MIB-1 of 30%, and no HER-2 amplification, the signals ratio being 1.43 and the number per cell 2.15.  The patient's subsequent history is as detailed below  INTERVAL HISTORY: Tiffany Browning was evaluated in the breast cancer clinic 01/28/2016 accompanied by her husband Tiffany Browning.  Her case was also presented in the multidisciplinary breast cancer conference 01/15/2016.[name] At that time a preliminary plan was proposed: Breast conserving surgery, consideration of Oncotype, and adjuvant radiation.  REVIEW OF SYSTEMS: Aside from the mass itself, there were no specific symptoms leading to the original mammogram, which was routinely scheduled. The patient denies unusual headaches, visual changes, nausea, vomiting, stiff neck, dizziness, or gait imbalance. There has been no cough, phlegm production, or pleurisy, no chest pain or pressure, and no change in bowel or bladder habits. The patient denies fever, rash, bleeding, unexplained fatigue or unexplained weight loss. Tiffany Browning is now having significant problems with hot flashes and night sweats since going off estrogen. A detailed review of systems was otherwise entirely negative.     [name] PAST MEDICAL HISTORY: Past Medical History  Diagnosis Date  . Diabetes mellitus     Type 1  . Asthma   . Allergy   . Thyroid disease     Hypothyroidism    PAST SURGICAL HISTORY: Past Surgical History  Procedure Laterality Date  . Tonsilectomy, adenoidectomy, bilateral myringotomy and tubes    . Appendectomy    . Shoulder arthroscopy  2004    b/L --second one 2011  . Tubal ligation      then reversed it  . Ruptured ovarian cyst    . Nasal sinus surgery  FAMILY HISTORY Family History  Problem Relation Age of Onset  . Cancer Mother     cervical  . Hypertension Mother   . Hyperlipidemia Mother   . Heart disease Father 18    MI---- cabg before that  . Hypothyroidism Sister   . Heart disease Brother 66     MI--- second one 52  . Hyperlipidemia Brother   . Hypertension Brother   . Hypothyroidism Sister   The patient's father died from heart disease in his 76s in the setting of tobacco abuse. The patient's mother died from pneumonia at the age of 46 also in the setting of tobacco abuse. The patient had one brother, 2 sisters. One of the sisters, Basilio Cairo, was diagnosed with breast cancer at the age of 53. The patient's mother may have had a history of cervical cancer. There is no history of ovarian cancer in the family.   GYNECOLOGIC HISTORY:  No LMP recorded. Patient is postmenopausal.  menarche 89. The patient is GX P0. [name] underwent total abdominal hysterectomy with bilateral salpingo-oophorectomy in 1990 for what was thought at that time might be ovarian cancer but proved to be a benign cyst. She has been on estrogen replacement until 01/16/2016 .  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 She is taking courses in advanced codeine at [employer] in is going to be "graduating" late July. Her husband Tiffany Browning (CORLIS ANGELICA) works for [employer]. ItIs just the 2 of them plus their dog Jasmine at home.     ADVANCED DIRECTIVES:  in place: Both have a living well.    HEALTH MAINTENANCE: Social History  Substance Use Topics  . Smoking status: Never Smoker   . Smokeless tobacco: Never Used  . Alcohol Use: No     Colonoscopy:2007/ [facility]  PAP: s/p hysterectomy  Bone density:  Lipid panel:  Allergies  Allergen Reactions  . Clearsil [Benzoyl Peroxide] Rash    Current Outpatient Prescriptions  Medication Sig Dispense Refill  . ciprofloxacin (CIPRO) 250 MG tablet Take 1 tablet (250 mg total) by mouth 2 (two) times daily. 6 tablet 0  . fish oil-omega-3 fatty acids 1000 MG capsule Take 1 g by mouth daily.      . insulin aspart (NOVOLOG) 100 UNIT/ML injection Per Dr Forde Dandy 3 vial PRN  . levothyroxine (SYNTHROID, LEVOTHROID)  200 MCG tablet Take 200 mcg by mouth daily.      . montelukast (SINGULAIR) 10 MG tablet Take 10 mg by mouth at bedtime.      . Multiple Vitamin (MULTIVITAMIN) tablet Take 1 tablet by mouth daily.      . NON FORMULARY Inject 0.4 application into the skin every hour. NOVOLOG PUMP      No current facility-administered medications for this visit.    OBJECTIVE: Middle-aged white woman who appears well  Filed Vitals:   01/28/16 1608  BP: 145/63  Pulse: 54  Temp: 97.8 F (36.6 C)  Resp: 18     Body mass index is 23.96 kg/(m^2).    ECOG FS:0 - Asymptomatic  Ocular: Sclerae unicteric, pupils equal, round and reactive to light Ear-nose-throat: Oropharynx clear and moist Lymphatic: No cervical or supraclavicular adenopathy Lungs no rales or rhonchi, good excursion bilaterally Heart regular rate and rhythm, no murmur appreciated Abd soft, nontender, positive bowel sounds MSK no focal spinal tenderness, no joint edema Neuro: non-focal, well-oriented, appropriate affect Breasts:  there is a movable mass in the upper outer quadrant of  the right breast, measuring approximately 2-1/2 cm. There is no overlying erythema. I do not palpate a right axillary lymph node. Left breast is unremarkable.    LAB RESULTS:  CMP     Component Value Date/Time   NA 139 01/28/2016 1547   K 4.3 01/28/2016 1547   CO2 29 01/28/2016 1547   GLUCOSE 129 01/28/2016 1547   BUN 17.6 01/28/2016 1547   CREATININE 1.0 01/28/2016 1547   CALCIUM 9.4 01/28/2016 1547   PROT 7.6 01/28/2016 1547   ALBUMIN 3.9 01/28/2016 1547   AST 35* 01/28/2016 1547   ALT 38 01/28/2016 1547   ALKPHOS 101 01/28/2016 1547   BILITOT 0.31 01/28/2016 1547    INo results found for: SPEP, UPEP  Lab Results  Component Value Date   WBC 5.1 01/28/2016   NEUTROABS 2.3 01/28/2016   HGB 11.7 01/28/2016   HCT 35.5 01/28/2016   MCV 92.9 01/28/2016   PLT 333 01/28/2016      Chemistry      Component Value Date/Time   NA 139 01/28/2016  1547   K 4.3 01/28/2016 1547   CO2 29 01/28/2016 1547   BUN 17.6 01/28/2016 1547   CREATININE 1.0 01/28/2016 1547      Component Value Date/Time   CALCIUM 9.4 01/28/2016 1547   ALKPHOS 101 01/28/2016 1547   AST 35* 01/28/2016 1547   ALT 38 01/28/2016 1547   BILITOT 0.31 01/28/2016 1547       No results found for: LABCA2  No components found for: LABCA125  No results for input(s): INR in the last 168 hours.  Urinalysis    Component Value Date/Time   COLORURINE yellow 02/06/2010 1109   APPEARANCEUR Clear 02/06/2010 1109   LABSPEC 1.015 02/06/2010 1109   PHURINE 6.5 02/06/2010 1109   HGBUR negative 02/06/2010 1109   BILIRUBINUR neg 01/28/2016 0824   BILIRUBINUR negative 02/06/2010 1109   PROTEINUR neg 01/28/2016 0824   UROBILINOGEN 0.2 01/28/2016 0824   UROBILINOGEN 0.2 02/06/2010 1109   NITRITE neg 01/28/2016 0824   NITRITE negative 02/06/2010 1109   LEUKOCYTESUR Negative 01/28/2016 0824     STUDIES: Mm Digital Diagnostic Unilat R  01/09/2016  CLINICAL DATA:  Evaluate marker placement EXAM: DIAGNOSTIC RIGHT MAMMOGRAM POST ULTRASOUND BIOPSY COMPARISON:  Previous exam(s). FINDINGS: Mammographic images were obtained following ultrasound guided biopsy of 2 right breast mass. Both markers are in good position. IMPRESSION: Both biopsy markers are in good position. Final Assessment: Post Procedure Mammograms for Marker Placement Electronically Signed   By: Dorise Bullion III M.D   On: 01/09/2016 16:30   US Breast Ltd Uni Right Inc Axilla  12/31/2015  CLINICAL DATA:  60 year old patient with palpable area of concern in the 9 o'clock region of the right breast. The palpable mass was initially found on clinical physical exam, the patient can now also palpated. The patient's sister was diagnosed with breast cancer at age 62. The patient has a long-standing history of insulin-dependent type 1 diabetes. EXAM: 2D DIGITAL DIAGNOSTIC RIGHT MAMMOGRAM WITH ADJUNCT TOMO ULTRASOUND RIGHT  BREAST COMPARISON:  Previous exam(s). ACR Breast Density Category d: The breast tissue is extremely dense, which lowers the sensitivity of mammography. FINDINGS: Metallic skin marker was placed in the outer right breast 9 o'clock region, in the area of palpable concern. Extremely dense breast parenchymal pattern appears stable. On the focal spot compression view of the region of palpable concern, is a possible mass, but difficult to distinguish from the extremely dense parenchymal pattern. On physical exam,  there is a firm palpable lump measuring approximately 3 cm in the 9 o'clock region of the right breast, centered approximately 4 cm from the nipple. The skin of the right breast appears normal. An insulin pump is noticed in the right abdomen. Targeted ultrasound is performed, showing an irregular heterogeneously hypoechoic mass with some vascular flow is imaged at 9 o'clock position 4 cm from nipple. This mass measures approximately 2.9 x 1.8 x 3.0 cm. Adjacent to but separate from the palpable mass, 9 o'clock position approximately 2 cm from nipple is a hypoechoic oval mass with partially circumscribed and partially indistinct Set margins, with a tail-like extension. This mass measures approximately 2.0 x 1.1 x 0.6 cm and shows no vascular flow. Ultrasound of the right axilla is negative. IMPRESSION: 1. Suspicious palpable mass 9 o'clock position right breast 4 cm from the nipple. This measures 3.0 cm greatest diameter. Primary considerations include malignancy versus benign diabetic mastopathy secondary to long-standing insulin-dependent Type 1 diabetes. 2. Solid partially circumscribed oval mass 9 o'clock position right breast 2 cm from nipple, separate from the palpable mass. While this could be a benign mass such is fibroadenoma or intraductal papilloma, biopsy is suggested to exclude malignancy. 3. Negative for right axillary lymphadenopathy. RECOMMENDATION: Ultrasound-guided right breast biopsies of 2  separate masses in the 9 o'clock position of the right breast as described above. These are scheduled on 01/09/2016 at 3 o'clock p.m. I have discussed the findings and recommendations with the patient. Results were also provided in writing at the conclusion of the visit. If applicable, a reminder letter will be sent to the patient regarding the next appointment. BI-RADS CATEGORY  4: Suspicious. Electronically Signed   By: Curlene Dolphin M.D.   On: 12/31/2015 09:16   Mm Diag Breast Tomo Uni Right  12/31/2015  CLINICAL DATA:  60 year old patient with palpable area of concern in the 9 o'clock region of the right breast. The palpable mass was initially found on clinical physical exam, the patient can now also palpated. The patient's sister was diagnosed with breast cancer at age 41. The patient has a long-standing history of insulin-dependent type 1 diabetes. EXAM: 2D DIGITAL DIAGNOSTIC RIGHT MAMMOGRAM WITH ADJUNCT TOMO ULTRASOUND RIGHT BREAST COMPARISON:  Previous exam(s). ACR Breast Density Category d: The breast tissue is extremely dense, which lowers the sensitivity of mammography. FINDINGS: Metallic skin marker was placed in the outer right breast 9 o'clock region, in the area of palpable concern. Extremely dense breast parenchymal pattern appears stable. On the focal spot compression view of the region of palpable concern, is a possible mass, but difficult to distinguish from the extremely dense parenchymal pattern. On physical exam, there is a firm palpable lump measuring approximately 3 cm in the 9 o'clock region of the right breast, centered approximately 4 cm from the nipple. The skin of the right breast appears normal. An insulin pump is noticed in the right abdomen. Targeted ultrasound is performed, showing an irregular heterogeneously hypoechoic mass with some vascular flow is imaged at 9 o'clock position 4 cm from nipple. This mass measures approximately 2.9 x 1.8 x 3.0 cm. Adjacent to but separate from  the palpable mass, 9 o'clock position approximately 2 cm from nipple is a hypoechoic oval mass with partially circumscribed and partially indistinct Set margins, with a tail-like extension. This mass measures approximately 2.0 x 1.1 x 0.6 cm and shows no vascular flow. Ultrasound of the right axilla is negative. IMPRESSION: 1. Suspicious palpable mass 9 o'clock position right breast  4 cm from the nipple. This measures 3.0 cm greatest diameter. Primary considerations include malignancy versus benign diabetic mastopathy secondary to long-standing insulin-dependent Type 1 diabetes. 2. Solid partially circumscribed oval mass 9 o'clock position right breast 2 cm from nipple, separate from the palpable mass. While this could be a benign mass such is fibroadenoma or intraductal papilloma, biopsy is suggested to exclude malignancy. 3. Negative for right axillary lymphadenopathy. RECOMMENDATION: Ultrasound-guided right breast biopsies of 2 separate masses in the 9 o'clock position of the right breast as described above. These are scheduled on 01/09/2016 at 3 o'clock p.m. I have discussed the findings and recommendations with the patient. Results were also provided in writing at the conclusion of the visit. If applicable, a reminder letter will be sent to the patient regarding the next appointment. BI-RADS CATEGORY  4: Suspicious. Electronically Signed   By: Curlene Dolphin M.D.   On: 12/31/2015 09:16   [place] Rt Breast Bx W Loc Dev 1st Lesion Img Bx Spec US Guide  01/10/2016  ADDENDUM REPORT: 01/10/2016 11:06 ADDENDUM: Pathology revealed FIBROADENOMA of the Right breast at the 9:00 o'clock location, 2 CMFN. GRADE II INVASIVE AND IN SITU MAMMARY CARCINOMA of the Right breast at the 9:00 o'clock location, 4 CMFN. This was found to be concordant by Dr. Lillia Mountain. Pathology results were discussed with the patient by telephone. The patient reported doing well after the biopsies with tenderness at the sites. Post biopsy instructions  and care were reviewed and questions were answered. The patient was encouraged to call [facility] for any additional concerns. Surgical consultation has been arranged with Dr. Adonis Housekeeper at [facility] on January 17, 2016. Pathology results reported by Terie Purser, RN on 01/10/2016. Electronically Signed   By: Dorise Bullion III M.D   On: 01/10/2016 11:06  01/10/2016  CLINICAL DATA:  Right breast mass at 9 o'clock, 4 cm from the nipple EXAM: ULTRASOUND GUIDED RIGHT BREAST CORE NEEDLE BIOPSY COMPARISON:  Previous exam(s). FINDINGS: I met with the patient and we discussed the procedure of ultrasound-guided biopsy, including benefits and alternatives. We discussed the high likelihood of a successful procedure. We discussed the risks of the procedure, including infection, bleeding, tissue injury, clip migration, and inadequate sampling. Informed written consent was given. The usual time-out protocol was performed immediately prior to the procedure. Using sterile technique and 1% Lidocaine as local anesthetic, under direct ultrasound visualization, a 12 gauge spring-loaded device was used to perform biopsy of a right breast mass using a ladder approach. At the conclusion of the procedure a ribbon shaped tissue marker clip was deployed into the biopsy cavity. Follow up 2 view mammogram was performed and dictated separately. IMPRESSION: Ultrasound guided biopsy of a right breast. No apparent complications. Electronically Signed: By: Dorise Bullion III M.D On: 01/09/2016 16:17   [place] Rt Breast Bx W Loc Dev Ea Add Lesion Img Bx Spec US Guide  01/10/2016  ADDENDUM REPORT: 01/10/2016 11:06 ADDENDUM: Pathology revealed FIBROADENOMA of the Right breast at the 9:00 o'clock location, 2 CMFN. GRADE II INVASIVE AND IN SITU MAMMARY CARCINOMA of the Right breast at the 9:00 o'clock location, 4 CMFN. This was found to be concordant by Dr. Lillia Mountain. Pathology results were discussed with the  patient by telephone. The patient reported doing well after the biopsies with tenderness at the sites. Post biopsy instructions and care were reviewed and questions were answered. The patient was encouraged to call [facility]  for any additional concerns. Surgical consultation has been arranged with Dr. Adonis Housekeeper at [facility] on January 17, 2016. Pathology results reported by Terie Purser, RN on 01/10/2016. Electronically Signed   By: Dorise Bullion III M.D   On: 01/10/2016 11:06  01/10/2016  CLINICAL DATA:  Right breast mass at 9 o'clock, 2 cm from the nipple EXAM: ULTRASOUND GUIDED RIGHT BREAST CORE NEEDLE BIOPSY COMPARISON:  Previous exam(s). FINDINGS: I met with the patient and we discussed the procedure of ultrasound-guided biopsy, including benefits and alternatives. We discussed the high likelihood of a successful procedure. We discussed the risks of the procedure, including infection, bleeding, tissue injury, clip migration, and inadequate sampling. Informed written consent was given. The usual time-out protocol was performed immediately prior to the procedure. Using sterile technique and 1% Lidocaine as local anesthetic, under direct ultrasound visualization, a 12 gauge spring-loaded device was used to perform biopsy of a right breast mass, 2 cm from the nipple using a lateral approach. At the conclusion of the procedure a coil shaped tissue marker clip was deployed into the biopsy cavity. Follow up 2 view mammogram was performed and dictated separately. IMPRESSION: Ultrasound guided biopsy of a right breast mass, 2 cm from the nipple at 9 o'clock. No apparent complications. Electronically Signed: By: Dorise Bullion III M.D On: 01/09/2016 16:18    ELIGIBLE FOR AVAILABLE RESEARCH PROTOCOL: P[place]AS  ASSESSMENT: 60 y.o. [place] woman status post right breast upper outer quadrant biopsy 01/09/2016 for a clinical T2 N0, stage IIA invasive ductal carcinoma, grade  2, estrogen and progesterone receptor positive, HER-2 nonamplified, with an MIB-1 of 30%.   (1) neoadjuvant tamoxifen started 01/28/2016  (2) definitive surgery pending  (3) Oncotype DX to be requested from final surgical sample  (4) adjuvant chemotherapy to follow as appropriate  (5) adjuvant radiation to follow  (6) adjuvant anti-estrogens to be resumed at the completion of local treatment.  PLAN: We spent the better part of today's hour-long appointment discussing the biology of breast cancer in general, and the specifics of the patient's tumor in particular. Tiffany Browning understands cancer is not one disease but more than 125 different diseases each treated and evaluated differently. She also understands that if breast cancer spreads to her liver bones or lungs, the patient would not have 4 different types of cancer but 1 cancer in 4 places. Keeping the simple distinctions in mind can really help avoid multiple confusions liver.  We then discussed the difference between local and systemic therapy. In terms of local treatment, Tiffany Browning understands that lumpectomy plus radiation is equivalent to mastectomy in terms of survival. This is true even if she carried a deleterious mutation: Of course bilateral mastectomies would be an option in that case, but lumpectomy and radiation followed by intensified screening with yearly mammography and yearly MRI would be equally safe.  In terms of systemic treatment she would be a good candidate for anti-estrogens, and not at all a candidate for anti-HER-2 immunotherapy. The question of chemotherapy is more complicated. Her breast cancer is intermediate N--grade 2, stage II, with a borderline/slightly fast proliferation fraction. Chemotherapy works best in aggressive fast growing tumors and not at all well in slow-growing nonaggressive tumors.  Because her cancer is intermediate we will request an Oncotype S from the definitive surgical sample to help [place] make the  chemotherapy decision. If she carries a positive lymph node we will request a Mammaprint instead.  In any case we will have the results a couple of  weeks after her definitive surgery and at that time we can discuss this specific numbers involved and she can make a decision regarding chemotherapy. At this point it is impossible for me to predict whether she would significantly benefit from chemotherapy or not.  Tiffany Browning is interested in genetics counseling, but she does not meet criteria for testing. There are relatively inexpensive tests available that she may purchase on her own. If she wishes to do that she may want to meet with one of our genetics counselors first to get the best possible advice.  Tiffany Browning also is very busy with her studies and would prefer to postpone her surgery until the end of July or the beginning of August. She can do this safely if she starts on anti-estrogens now.  Accordingly we had a discussion regarding anastrozole versus tamoxifen. She has a good understanding of the possible toxicities, side effects and complications of these agents. I think she would be a particularly good candidate for tamoxifen, because she took estrogen for many years with no clots, and because she is status post hysterectomy.  Accordingly I have sent a prescription for tamoxifen to her pharmacy and she is welcome to start that today. Dr. Etter Sjogren has already written for venlafaxine 37.5 mg to be started at the same time and I think that is a good move also.  Tiffany Browning is already scheduled for an MRI tomorrow and to meet with Dr. Marlou Starks within the next week to make a definitive surgical decision. She will meet with me approximately 2 weeks after her surgery to discuss her Oncotype results. She has a good understanding of the overall plan. She agrees with it. She knows the goal of treatment in her case is cure. She will call with any problems that may develop before her next visit here.  Chauncey Cruel, MD    01/28/2016 5:32 PM Medical Oncology and Hematology [facility] [address] [place], [place] [address] Tel. [phone]    Fax. [phone]

## 2016-01-29 ENCOUNTER — Ambulatory Visit
Admission: RE | Admit: 2016-01-29 | Discharge: 2016-01-29 | Disposition: A | Discharge status: Home / Self Care | Payer: 59 | Source: Ambulatory Visit | Attending: General Surgery | Admitting: General Surgery

## 2016-01-29 ENCOUNTER — Telehealth: Payer: Self-pay | Admitting: Oncology

## 2016-01-29 DIAGNOSIS — C50411 Malignant neoplasm of upper-outer quadrant of right female breast: Secondary | ICD-10-CM

## 2016-01-29 LAB — URINE CULTURE: Colony Count: 5000

## 2016-01-29 MED ORDER — GADOBENATE DIMEGLUMINE 529 MG/ML IV SOLN
10.0000 mL | Freq: Once | INTRAVENOUS | Status: AC | PRN
  Administered 2016-01-29: 10 mL via INTRAVENOUS

## 2016-01-29 NOTE — Telephone Encounter (Signed)
Spoke with patient to confirm genetics appt 7/12 at 10 am

## 2016-01-30 ENCOUNTER — Telehealth: Payer: Self-pay | Admitting: *Deleted

## 2016-01-30 NOTE — Progress Notes (Addendum)
Location of Breast Cancer: Right Breast  Histology per Pathology Report:  01/09/16 Diagnosis 1. Breast, right, needle core biopsy, 9:00 o'clock, 2 cm from nipple - FIBROADENOMA. - THERE IS NO EVIDENCE OF MALIGNANCY - SEE COMMENT. 2. Breast, right, needle core biopsy, 9:00 o'clock, 4 cm from nipple - INVASIVE AND IN SITU MAMMARY CARICNOMA  Receptor Status: ER(100%), PR (100%), Her2-neu (NEG), Ki-(30%)  Did patient present with symptoms or was this found on screening mammography?: It was found on a screening mammogram.   Past/Anticipated interventions by surgeon, if any: Dr. Marlou Starks plans to perform a Right Breast Lumpectomy and sentinel node mapping. The patient states it will be about the first week of August, because of her school schedule.   Past/Anticipated interventions by medical oncology, if any:  Dr. Jana Hakim 01/28/16 (1) neoadjuvant tamoxifen started 01/28/2016 (2) definitive surgery pending (3) Oncotype DX to be requested from final surgical sample (4) adjuvant chemotherapy to follow as appropriate (5) adjuvant radiation to follow (6) adjuvant anti-estrogens to be resumed at the completion of local treatment.  Lymphedema issues, if any:  N/A  Pain issues, if any:  She denies   SAFETY ISSUES:  Prior radiation? No  Pacemaker/ICD? No  Possible current pregnancy? No  Is the patient on methotrexate? No  Current Complaints / other details:   Genetic Counseling appointment 02/26/16 at 10:00  She had an MRI on 01/29/16 which showed another lesion in her Right Breast. She is to have a biopsy to this area on 02/14/16.  BP 144/67 mmHg  Pulse 59  Temp(Src) 98 F (36.7 C)  Ht 5' 1.25" (1.556 m)  Wt 129 lb (58.514 kg)  BMI 24.17 kg/m2  SpO2 97%   Wt Readings from Last 3 Encounters:  02/05/16 129 lb (58.514 kg)  01/28/16 127 lb 14.4 oz (58.015 kg)  01/28/16 128 lb 12.8 oz (58.423 kg)      Mishelle Hassan, Stephani Police, RN 01/30/2016,3:50 PM

## 2016-01-30 NOTE — Telephone Encounter (Signed)
  Oncology Nurse Navigator Documentation  Navigator Location: [facility] (01/30/16 1000) Navigator Encounter Type: Introductory phone call (01/30/16 1000)   Abnormal Finding Date: 12/31/15 (01/30/16 1000) Confirmed Diagnosis Date: 01/09/16 (01/30/16 1000)     Patient Visit Type: MedOnc;Initial (01/30/16 1000) Treatment Phase: Pre-Tx/Tx Discussion (01/30/16 1000) Barriers/Navigation Needs: No barriers at this time (01/30/16 1000)                Acuity: Level 2 (01/30/16 1000)   Acuity Level 2: Initial guidance, education and coordination as needed;Educational needs;Assistance expediting appointments;Ongoing guidance and education throughout treatment as needed (01/30/16 1000)     Time Spent with Patient: 15 (01/30/16 1000)

## 2016-01-30 NOTE — Addendum Note (Signed)
Addended by: Tasia Catchings on: 01/30/2016 03:45 PM   Modules accepted: Miquel Dunn

## 2016-01-31 LAB — URINE CYTOLOGY ANCILLARY ONLY
Bacterial vaginitis: POSITIVE — AB
Candida vaginitis: NEGATIVE

## 2016-02-01 ENCOUNTER — Telehealth: Payer: Self-pay | Admitting: Medical

## 2016-02-01 MED ORDER — METRONIDAZOLE 500 MG PO TABS: 500.0000 mg | ORAL_TABLET | Freq: Three times a day (TID) | ORAL | Status: DC

## 2016-02-01 NOTE — Telephone Encounter (Signed)
Sent in flagyl

## 2016-02-03 ENCOUNTER — Telehealth: Payer: Self-pay

## 2016-02-03 NOTE — Addendum Note (Signed)
Addended by: Tasia Catchings on: 02/03/2016 03:12 PM   Modules accepted: Miquel Dunn

## 2016-02-03 NOTE — Addendum Note (Signed)
Addended by: Tasia Catchings on: 02/03/2016 03:13 PM   Modules accepted: Miquel Dunn

## 2016-02-03 NOTE — Telephone Encounter (Signed)
Msg rcvd from pt requesting MRI results.  Inbasket sent to [facility],

## 2016-02-04 ENCOUNTER — Other Ambulatory Visit: Payer: Self-pay | Admitting: General Surgery

## 2016-02-04 DIAGNOSIS — R928 Other abnormal and inconclusive findings on diagnostic imaging of breast: Secondary | ICD-10-CM

## 2016-02-05 ENCOUNTER — Ambulatory Visit
Admission: RE | Admit: 2016-02-05 | Discharge: 2016-02-05 | Disposition: A | Discharge status: Home / Self Care | Payer: 59 | Source: Ambulatory Visit | Attending: Radiation Oncology | Admitting: Radiation Oncology

## 2016-02-05 ENCOUNTER — Encounter: Payer: Self-pay | Admitting: Radiation Oncology

## 2016-02-05 VITALS — BP 144/67 | HR 59 | Temp 98.0°F | Ht 61.25 in | Wt 129.0 lb

## 2016-02-05 DIAGNOSIS — J45909 Unspecified asthma, uncomplicated: Secondary | ICD-10-CM | POA: Diagnosis not present

## 2016-02-05 DIAGNOSIS — E119 Type 2 diabetes mellitus without complications: Secondary | ICD-10-CM | POA: Diagnosis not present

## 2016-02-05 DIAGNOSIS — Z794 Long term (current) use of insulin: Secondary | ICD-10-CM | POA: Insufficient documentation

## 2016-02-05 DIAGNOSIS — Z79899 Other long term (current) drug therapy: Secondary | ICD-10-CM | POA: Insufficient documentation

## 2016-02-05 DIAGNOSIS — C50411 Malignant neoplasm of upper-outer quadrant of right female breast: Secondary | ICD-10-CM | POA: Diagnosis not present

## 2016-02-05 DIAGNOSIS — E079 Disorder of thyroid, unspecified: Secondary | ICD-10-CM | POA: Insufficient documentation

## 2016-02-05 NOTE — Progress Notes (Signed)
Radiation Oncology         [phone] ________________________________  Initial Outpatient Consultation  Name: Tiffany Browning MRN: [id number]  Date: 02/05/2016  DOB: 06/14/1956  CC:[name], DO  Lowne Chase, [place], *   REFERRING PHYSICIAN: Roma Schanz R, *  DIAGNOSIS:    ICD-9-CM ICD-10-CM   1. 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 female who presented for a routine physical exam by her gynecologist in May 2017. During that visit, her gynecologist was able to palpate a mass in the upper outer quadrant of her right breast. She previosuly had a bilateral screening mammography on 06/26/15 with no suspicious findings. On 12/31/15 she underwent bilateral diagnostic mammography/ultrasound this showed evidence of a 3 cm mass in the 9 o'clock position about 4 cm from the nipple. In addition, a separate palpable 2cm mass at the 9 o'clock position, 2 cm from the nipple, was also found. Ultrasound of the right axilla was negative. Biopsy of the right breast on 01/09/16 showed an invasive and in situ mammary carcinoma at the 9 o'clock position, 4 cm from the nipple. Biopsy of the mass 2 cm from the nipple at 9 o'clock was negative for malignancy.   MRI of breast on 01/29/16 demonstrated a 3.4 cm spiculated right breast mass at 8-9 o'clock. In the anterior right breast, there is a 0.7 cm spiculated mass for which MRI core biopsy was recommendated for a consideration. No suspicious masses in the left breast or bilateral lymph nodes. Repeat biopsy anticipated 02/14/16.   She met with Dr.[name] on 01/28/16 who has recommended neoadjuvant Tamoxifen. He recommends Oncotype Dx testing after surgery with chemotherapy if appropriate.   The patient has met with  Dr.Toth and a right breast lumpectomy and sentinel node mapping is planned for later this summer. She has a genetic counseling appointment scheduled for 02/26/16.  Patient is currently in school studying medical administration at [employer]  She denies tobacco abuse; she has occasional glass of wine.  PREVIOUS RADIATION THERAPY: No  PAST MEDICAL HISTORY:  has a past medical history of Diabetes mellitus; Asthma; Allergy; and Thyroid disease.    PAST SURGICAL HISTORY: Past Surgical History  Procedure Laterality Date  . Tonsilectomy, adenoidectomy, bilateral myringotomy and tubes    . Appendectomy    . Shoulder arthroscopy  2004    b/L --second one 2011  . Tubal ligation      then reversed it  . Ruptured ovarian cyst    . Nasal sinus surgery      FAMILY HISTORY: family history includes Cancer in her mother; Heart disease (age of onset: 35) in her brother; Heart disease (age of onset: 43) in her father; Hyperlipidemia in her brother and mother; Hypertension in her brother and mother; Hypothyroidism in her sister and sister.  SOCIAL HISTORY:  reports that she has never smoked. She has never used smokeless tobacco. She reports that she does not drink alcohol or use illicit drugs.  ALLERGIES: Clearsil  MEDICATIONS:  Current Outpatient Prescriptions  Medication Sig Dispense Refill  . fish oil-omega-3 fatty acids 1000 MG capsule Take 1 g by mouth daily.      [name] levothyroxine (SYNTHROID, LEVOTHROID) 200 MCG tablet Take 200 mcg by mouth daily.      [name]  montelukast (SINGULAIR) 10 MG tablet Take 10 mg by mouth at bedtime.      . Multiple Vitamin (MULTIVITAMIN) tablet Take 1 tablet by mouth daily.      . NON FORMULARY Inject 0.4 application into the skin every hour. NOVOLOG PUMP     . tamoxifen (NOLVADEX) 20 MG tablet Take 1 tablet (20 mg total) by mouth daily. 90 tablet 12  . venlafaxine XR (EFFEXOR-XR) 37.5 MG 24 hr capsule Take 37.5 mg by mouth daily with breakfast.    . insulin aspart (NOVOLOG)  100 UNIT/ML injection Per Dr Forde Dandy (Patient taking differently: Per Dr Forde Dandy) 3 vial PRN  . metroNIDAZOLE (FLAGYL) 500 MG tablet Take 1 tablet (500 mg total) by mouth 3 (three) times daily. (Patient not taking: Reported on 02/05/2016) 21 tablet 0   No current facility-administered medications for this encounter.    REVIEW OF SYSTEMS:  Notable for that above.   PHYSICAL EXAM:  height is 5' 1.25" (1.556 m) and weight is 129 lb (58.514 kg). Her temperature is 98 F (36.7 C). Her blood pressure is 144/67 and her pulse is 59. Her oxygen saturation is 97%.   General: Alert and oriented, in no acute distress HEENT: Head is normocephalic. Extraocular movements are intact. Oropharynx is clear. Neck: Neck is supple, no palpable cervical or supraclavicular lymphadenopathy. Heart: Regular in rate and rhythm with no murmurs, rubs, or gallops. Chest: Clear to auscultation bilaterally, with no rhonchi, wheezes, or rales. Abdomen: Soft, nontender, nondistended, with no rigidity or guarding. Extremities: No cyanosis or edema. Lymphatics: see Neck Exam Skin: No concerning lesions. Musculoskeletal: symmetric strength and muscle tone throughout. Neurologic: Cranial nerves II through XII are grossly intact. No obvious focalities. Speech is fluent. Coordination is intact. Psychiatric: Judgment and insight are intact. Affect is appropriate. Breasts: 3cm mass in 8:30 position of right breast . No other palpable masses appreciated in the breasts or axillae bilaterally .   ECOG = 0  0 - Asymptomatic (Fully active, able to carry on all predisease activities without restriction)  1 - Symptomatic but completely ambulatory (Restricted in physically strenuous activity but ambulatory and able to carry out work of a light or sedentary nature. For example, light housework, office work)  2 - Symptomatic, <50% in bed during the day (Ambulatory and capable of all self care but unable to carry out any work activities. Up  and about more than 50% of waking hours)  3 - Symptomatic, >50% in bed, but not bedbound (Capable of only limited self-care, confined to bed or chair 50% or more of waking hours)  4 - Bedbound (Completely disabled. Cannot carry on any self-care. Totally confined to bed or chair)  5 - Death   Eustace Pen MM, Creech RH, Tormey DC, et al. [phone]). "Toxicity and response criteria of the [facility] [employer]". [place] Oncol. 5 (6): 649-55   LABORATORY DATA:  Lab Results  Component Value Date   WBC 5.1 01/28/2016   HGB 11.7 01/28/2016   HCT 35.5 01/28/2016   MCV 92.9 01/28/2016   PLT 333 01/28/2016   CMP     Component Value Date/Time   NA 139 01/28/2016 1547   K 4.3 01/28/2016 1547   CO2 29 01/28/2016 1547   GLUCOSE 129 01/28/2016 1547   BUN 17.6 01/28/2016 1547   CREATININE 1.0 01/28/2016 1547   CALCIUM 9.4 01/28/2016 1547   PROT 7.6 01/28/2016 1547   ALBUMIN 3.9 01/28/2016 1547   AST 35* 01/28/2016 1547   ALT  38 01/28/2016 1547   ALKPHOS 101 01/28/2016 1547   BILITOT 0.31 01/28/2016 1547         RADIOGRAPHY: Mr Breast Bilateral W Wo Contrast  01/29/2016  CLINICAL DATA:  Recent diagnosis of right breast cancer. Patient also had a second recent biopsy of the right breast which show fibroadenoma. LABS:  Creatinine was obtained on site at [facility] at [address]Results: Creatinine 0.7 mg/dL GFR 91. EXAM: BILATERAL BREAST MRI WITH AND WITHOUT CONTRAST TECHNIQUE: Multiplanar, multisequence MR images of both breasts were obtained prior to and following the intravenous administration of 10 ml of MultiHance. THREE-DIMENSIONAL MR IMAGE RENDERING ON INDEPENDENT WORKSTATION: Three-dimensional MR images were rendered by post-processing of the original MR data on an independent workstation. The three-dimensional MR images were interpreted, and findings are reported in the following complete MRI report for this study. Three dimensional images were evaluated at the  independent DynaCad workstation COMPARISON:  Previous exam(s). FINDINGS: Breast composition: d. Extreme fibroglandular tissue. Background parenchymal enhancement: Minimal Right breast: There is a 3.4 x 2.5 x 2.4 cm spiculated enhancing mass with associated clip at the right breast 8 to 9 o'clock position posterior third depth with washout enhancement kinetics consistent with patient's known biopsy proven cancer. There is post biopsy hematoma which obscures visualization of associated clip at the anterior to mid depth right breast 8 o'clock position without abnormal enhancement correlating to biopsy-proven fibroadenoma. At the upper outer quadrant anterior third depth right breast, there is a 0.6 x 0.7 x 0.7 cm spiculated enhancing mass with rising enhancement kinetics. Left breast: No mass or abnormal enhancement. Lymph nodes: No abnormal appearing lymph nodes. Ancillary findings:  None. IMPRESSION: Suspicious findings. RECOMMENDATION: MRI guided core biopsy of 0.7 cm spiculated enhancing mass in the upper-outer quadrant anterior third depth right breast if lumpectomy is being considered. BI-RADS CATEGORY  4: Suspicious. Electronically Signed   By: Abelardo Diesel M.D.   On: 01/29/2016 16:30   Mm Digital Diagnostic Unilat R  01/09/2016  CLINICAL DATA:  Evaluate marker placement EXAM: DIAGNOSTIC RIGHT MAMMOGRAM POST ULTRASOUND BIOPSY COMPARISON:  Previous exam(s). FINDINGS: Mammographic images were obtained following ultrasound guided biopsy of 2 right breast mass. Both markers are in good position. IMPRESSION: Both biopsy markers are in good position. Final Assessment: Post Procedure Mammograms for Marker Placement Electronically Signed   By: Dorise Bullion III M.D   On: 01/09/2016 16:30   [place] Rt Breast Bx W Loc Dev 1st Lesion Img Bx Spec US Guide  01/10/2016  ADDENDUM REPORT: 01/10/2016 11:06 ADDENDUM: Pathology revealed FIBROADENOMA of the Right breast at the 9:00 o'clock location, 2 CMFN. GRADE II INVASIVE  AND IN SITU MAMMARY CARCINOMA of the Right breast at the 9:00 o'clock location, 4 CMFN. This was found to be concordant by Dr. Lillia Mountain. Pathology results were discussed with the patient by telephone. The patient reported doing well after the biopsies with tenderness at the sites. Post biopsy instructions and care were reviewed and questions were answered. The patient was encouraged to call [facility] for any additional concerns. Surgical consultation has been arranged with Dr. Adonis Housekeeper at [facility] on January 17, 2016. Pathology results reported by Terie Purser, RN on 01/10/2016. Electronically Signed   By: Dorise Bullion III M.D   On: 01/10/2016 11:06  01/10/2016  CLINICAL DATA:  Right breast mass at 9 o'clock, 4 cm from the nipple EXAM: ULTRASOUND GUIDED RIGHT BREAST CORE NEEDLE BIOPSY COMPARISON:  Previous exam(s). FINDINGS:  I met with the patient and we discussed the procedure of ultrasound-guided biopsy, including benefits and alternatives. We discussed the high likelihood of a successful procedure. We discussed the risks of the procedure, including infection, bleeding, tissue injury, clip migration, and inadequate sampling. Informed written consent was given. The usual time-out protocol was performed immediately prior to the procedure. Using sterile technique and 1% Lidocaine as local anesthetic, under direct ultrasound visualization, a 12 gauge spring-loaded device was used to perform biopsy of a right breast mass using a ladder approach. At the conclusion of the procedure a ribbon shaped tissue marker clip was deployed into the biopsy cavity. Follow up 2 view mammogram was performed and dictated separately. IMPRESSION: Ultrasound guided biopsy of a right breast. No apparent complications. Electronically Signed: By: Dorise Bullion III M.D On: 01/09/2016 16:17   [place] Rt Breast Bx W Loc Dev Ea Add Lesion Img Bx Spec US Guide  01/10/2016  ADDENDUM REPORT:  01/10/2016 11:06 ADDENDUM: Pathology revealed FIBROADENOMA of the Right breast at the 9:00 o'clock location, 2 CMFN. GRADE II INVASIVE AND IN SITU MAMMARY CARCINOMA of the Right breast at the 9:00 o'clock location, 4 CMFN. This was found to be concordant by Dr. Lillia Mountain. Pathology results were discussed with the patient by telephone. The patient reported doing well after the biopsies with tenderness at the sites. Post biopsy instructions and care were reviewed and questions were answered. The patient was encouraged to call [facility] for any additional concerns. Surgical consultation has been arranged with Dr. Adonis Housekeeper at [facility] on January 17, 2016. Pathology results reported by Terie Purser, RN on 01/10/2016. Electronically Signed   By: Dorise Bullion III M.D   On: 01/10/2016 11:06  01/10/2016  CLINICAL DATA:  Right breast mass at 9 o'clock, 2 cm from the nipple EXAM: ULTRASOUND GUIDED RIGHT BREAST CORE NEEDLE BIOPSY COMPARISON:  Previous exam(s). FINDINGS: I met with the patient and we discussed the procedure of ultrasound-guided biopsy, including benefits and alternatives. We discussed the high likelihood of a successful procedure. We discussed the risks of the procedure, including infection, bleeding, tissue injury, clip migration, and inadequate sampling. Informed written consent was given. The usual time-out protocol was performed immediately prior to the procedure. Using sterile technique and 1% Lidocaine as local anesthetic, under direct ultrasound visualization, a 12 gauge spring-loaded device was used to perform biopsy of a right breast mass, 2 cm from the nipple using a lateral approach. At the conclusion of the procedure a coil shaped tissue marker clip was deployed into the biopsy cavity. Follow up 2 view mammogram was performed and dictated separately. IMPRESSION: Ultrasound guided biopsy of a right breast mass, 2 cm from the nipple at 9 o'clock. No  apparent complications. 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 If she remains a candidate for breast conservation, she will benefit from adjuvant radiotherapy. We also talked potential risk factors warranting post mastectomy radiation therapy. We will review her final pathology before any definitive treatment plans are established. Oncotype testing is anticpated.   It was a pleasure meeting the patient today. We discussed the risks, benefits, and side effects of radiotherapy. We discussed the potential benefits of radiotherapy to the right breast which could  reduce her risk of locoregional recurrence by 2/3.  We discussed that radiation would take approximately 4-6 weeks to complete and that I would give the patient a few weeks to heal following surgery before starting treatment planning.  If chemotherapy were to be given, this would precede radiotherapy. We spoke about acute effects including skin irritation and fatigue as well as much less common late effects including internal organ injury or irritation. We spoke about the latest technology that is used to minimize the risk of late effects for patients undergoing radiotherapy to the breast or chest wall. No guarantees of treatment were given. The patient is enthusiastic about proceeding with treatment. I look forward to participating in the patient's care. I'll await her referral back to me when the timing is appropriate.   __________________________________________   [name], MD   This document serves as a record of services personally performed by [name], MD. It was created on her behalf by Derek Mound, a trained medical scribe. The creation of this record is based on the scribe's personal observations and  the provider's statements to them. This document has been checked and approved by the attending provider.

## 2016-02-05 NOTE — Addendum Note (Signed)
Encounter addended by: Ernst Spell, RN on: 02/05/2016  4:28 PM<BR>     Documentation filed: Charges VN

## 2016-02-14 ENCOUNTER — Ambulatory Visit
Admission: RE | Admit: 2016-02-14 | Discharge: 2016-02-14 | Disposition: A | Discharge status: Home / Self Care | Payer: 59 | Source: Ambulatory Visit | Attending: General Surgery | Admitting: General Surgery

## 2016-02-14 DIAGNOSIS — R928 Other abnormal and inconclusive findings on diagnostic imaging of breast: Secondary | ICD-10-CM

## 2016-02-14 HISTORY — PX: BREAST BIOPSY: SHX20

## 2016-02-14 MED ORDER — GADOBENATE DIMEGLUMINE 529 MG/ML IV SOLN
10.0000 mL | Freq: Once | INTRAVENOUS | Status: AC | PRN
  Administered 2016-02-14: 10 mL via INTRAVENOUS

## 2016-02-17 ENCOUNTER — Telehealth: Payer: Self-pay | Admitting: Oncology

## 2016-02-17 ENCOUNTER — Other Ambulatory Visit: Payer: Self-pay | Admitting: *Deleted

## 2016-02-17 MED ORDER — VENLAFAXINE HCL ER 37.5 MG PO CP24: 75.0000 mg | ORAL_CAPSULE | Freq: Every day | ORAL | Status: DC

## 2016-02-17 NOTE — Telephone Encounter (Signed)
Pt called to this RN to state she is currently on venlafaxine 37.5 mg started by her GYN at time of diagnosis.  She would like to increase the dose to 75 mg a day and is requesting refills to be obtained through Dr Jana Hakim.  Prescription sent to pharmacy per above call.

## 2016-02-17 NOTE — Telephone Encounter (Signed)
Faxed pt medical records to [facility]

## 2016-02-26 ENCOUNTER — Ambulatory Visit (HOSPITAL_BASED_OUTPATIENT_CLINIC_OR_DEPARTMENT_OTHER): Payer: 59 | Admitting: Genetic Counselor

## 2016-02-26 ENCOUNTER — Other Ambulatory Visit: Payer: 59

## 2016-02-26 ENCOUNTER — Encounter: Payer: Self-pay | Admitting: Genetic Counselor

## 2016-02-26 DIAGNOSIS — Z8051 Family history of malignant neoplasm of kidney: Secondary | ICD-10-CM | POA: Diagnosis not present

## 2016-02-26 DIAGNOSIS — Z315 Encounter for genetic counseling: Secondary | ICD-10-CM | POA: Diagnosis not present

## 2016-02-26 DIAGNOSIS — Z803 Family history of malignant neoplasm of breast: Secondary | ICD-10-CM | POA: Diagnosis not present

## 2016-02-26 DIAGNOSIS — C50411 Malignant neoplasm of upper-outer quadrant of right female breast: Secondary | ICD-10-CM

## 2016-02-26 NOTE — Progress Notes (Signed)
REFERRING PROVIDER: [name], DO [address] [place], [place] [address]   Lurline Del, MD  PRIMARY PROVIDER:  [name], DO  PRIMARY REASON FOR VISIT:  1. Breast cancer of upper-outer quadrant of right female breast ([place])   2. Family history of breast cancer   3. Family history of kidney cancer      HISTORY OF PRESENT ILLNESS:   [name], a 60 y.o. female, was seen for a [place] cancer genetics consultation at the request of Dr. Jana Hakim due to a personal and family history of cancer.  Tiffany Browning presents to clinic today to discuss the possibility of a hereditary predisposition to cancer, genetic testing, and to further clarify her future cancer risks, as well as potential cancer risks for family members.   In 2017, at the age of 65, Ms. Sabatino was diagnosed with cancer of the righ breast. The tumor is ER+/PR+/Her2-. This will be treated with lumpectomy, and based on oncotype will determine chemotherapy.  CANCER HISTORY:   No history exists.     HORMONAL RISK FACTORS:  Menarche was at age 78.  First live birth at age N/A.  OCP use for approximately 0 years.  Ovaries intact: no.  Hysterectomy: yes.  Menopausal status: postmenopausal.  HRT use: a few years years. Colonoscopy: yes; normal. Mammogram within the last year: yes. Number of breast biopsies: 2. Up to date with pelvic exams:  no. Any excessive radiation exposure in the past:  no  Past Medical History  Diagnosis Date  . Diabetes mellitus     Type 1  . Asthma   . Allergy   . Thyroid disease     Hypothyroidism  . Breast cancer ([place])   . Family history of breast cancer   . Family history of kidney cancer     Past Surgical History  Procedure Laterality Date  . Tonsilectomy, adenoidectomy, bilateral myringotomy and tubes    . Appendectomy    . Shoulder arthroscopy  2004    b/L --second one 2011  . Tubal ligation      then reversed it  . Ruptured ovarian  cyst    . Nasal sinus surgery      Social History   Social History  . Marital Status: Married    Spouse Name: [name]  . Number of Children: 0  . Years of Education: N/A   Occupational History  . 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 Smoking status: Never Smoker   . Smokeless tobacco: Never Used  . Alcohol Use: No  . Drug Use: No  . Sexual Activity:    Partners: Male   Other Topics Concern  . None   Social History Narrative     FAMILY HISTORY:  We obtained a detailed, 4-generation family history.  Significant diagnoses are listed below: Family History  Problem Relation Age of Onset  . Cancer Mother     cervical  . Hypertension Mother   . Hyperlipidemia Mother   . Heart disease Father 56    MI---- cabg before that  . Hypothyroidism Sister   . Breast cancer Sister 27  . Heart disease Brother 57    MI--- second one 4  . Hyperlipidemia Brother   . Hypertension Brother   . Hypothyroidism Sister   . Kidney cancer Other 45    son of her sister with breast cancer    The patient does not have children.  She has two sisters and a brother.  One sister was diagnosed with breast cancer at age 82 and has a son with kidney cancer at 1.  The patient's parents are both deceased.  Her mother had eight siblings, none of whom had cancer.  Her father had two brothers who were cancer free.  There is no other reported family history of cancer.  Patient's maternal ancestors are of Caucasian descent, and paternal ancestors are of Caucasian descent. There is no reported Ashkenazi Jewish ancestry. There is no known consanguinity.  GENETIC COUNSELING ASSESSMENT: MAANVI LECOMPTE is a 60 y.o. female with a personal and family history of cancer which is somewhat suggestive of a familial or sporadic form of cancer. We, therefore, discussed and recommended the following at today's visit.   DISCUSSION: We discussed that about 5-10% of breast cancer is inherited  with most cases due to BRCA mutations.  Families with inherited cancer syndromes tend to have young ages of onset (typically 27 and below), multiple generations of individuals with cancer and related cancers in the family.  We discussed with Ms. Kubicki that the family history is not highly consistent with a familial hereditary cancer syndrome, and we feel she is at low risk to harbor a gene mutation associated with such a condition. Thus, we did not recommend any genetic testing, at this time, and recommended Ms. Roberge continue to follow the cancer screening guidelines given by her primary healthcare provider.  PLAN: Based on Ms. Divirgilio family history, we recommended her newphew, who was diagnosed with kidney cancer at age 28, have genetic counseling and testing. Ms. Faraone will let us know if we can be of any assistance in coordinating genetic counseling and/or testing for this family member. Based on his genetic testing, other family members may want to proceed with testing as well.  Lastly, we encouraged Ms. Calligan to remain in contact with cancer genetics annually so that we can continuously update the family history and inform her of any changes in cancer genetics and testing that may be of benefit for this family.   Ms.  Dols questions were answered to her satisfaction today. Our contact information was provided should additional questions or concerns arise. Thank you for the referral and allowing [place] to share in the care of your patient.   Karen P. Florene Glen, [place], [facility] Certified Genetic Counselor [email or web address] phone: [phone]  The patient was seen for a total of 45 minutes in face-to-face genetic counseling.  This patient was discussed with Drs. [name], Lindi Adie and/or Burr Medico who agrees with the above.    _______________________________________________________________________ For Office Staff:  Number of people involved in session: 1 Was an Intern/ student involved  with case: yes [employer]

## 2016-02-28 ENCOUNTER — Encounter: Payer: Self-pay | Admitting: *Deleted

## 2016-03-03 ENCOUNTER — Telehealth: Payer: Self-pay | Admitting: Oncology

## 2016-03-03 ENCOUNTER — Other Ambulatory Visit: Payer: Self-pay | Admitting: General Surgery

## 2016-03-03 DIAGNOSIS — C50411 Malignant neoplasm of upper-outer quadrant of right female breast: Secondary | ICD-10-CM

## 2016-03-03 NOTE — Telephone Encounter (Signed)
Spoke with patient. Appointment confirmed for 04/21/16. Tiffany Browning.

## 2016-03-07 ENCOUNTER — Other Ambulatory Visit (HOSPITAL_COMMUNITY)
Admission: RE | Admit: 2016-03-07 | Discharge: 2016-03-07 | Disposition: A | Discharge status: Home / Self Care | Payer: 59 | Source: Other Acute Inpatient Hospital | Attending: Radiation Oncology | Admitting: Radiation Oncology

## 2016-03-07 ENCOUNTER — Encounter: Payer: Self-pay | Admitting: Family Medicine

## 2016-03-07 ENCOUNTER — Ambulatory Visit (INDEPENDENT_AMBULATORY_CARE_PROVIDER_SITE_OTHER): Payer: 59 | Admitting: Family Medicine

## 2016-03-07 VITALS — BP 136/76 | HR 71 | Temp 98.0°F | Resp 20 | Wt 128.0 lb

## 2016-03-07 DIAGNOSIS — R300 Dysuria: Secondary | ICD-10-CM | POA: Insufficient documentation

## 2016-03-07 LAB — POCT URINALYSIS DIPSTICK
Bilirubin, UA: NEGATIVE
Blood, UA: NEGATIVE
Glucose, UA: NEGATIVE
Ketones, UA: NEGATIVE
Nitrite, UA: NEGATIVE
Protein, UA: NEGATIVE
Spec Grav, UA: 1.015
Urobilinogen, UA: NEGATIVE
pH, UA: 7

## 2016-03-07 MED ORDER — NITROFURANTOIN MONOHYD MACRO 100 MG PO CAPS: 100.0000 mg | ORAL_CAPSULE | Freq: Two times a day (BID) | ORAL | Status: DC

## 2016-03-07 NOTE — Patient Instructions (Signed)
Take the antibiotic as instructed.  We have ordered labs or studies at this visit. It can take up to 1-2 weeks for results and processing. IF results require follow up or explanation, we will call you with instructions. Clinically stable results will be released to your [facility]. If you have not heard from [place] or cannot find your results in [facility] in 2 weeks please contact our office at [phone].  If you are not yet signed up for [facility], please consider signing up.

## 2016-03-07 NOTE — Progress Notes (Signed)
HPI:  Acute visit for dysuria. Started 2 days ago. Symptoms include frequency, urgency, mild burning with urination. Denies flank pain, hematuria, nausea, vomiting, fevers, chills, vaginal symptoms. Appears on brief roc treated for UTI in June but culture with insignificant growth.  ROS: See pertinent positives and negatives per HPI.  Past Medical History  Diagnosis Date  . Diabetes mellitus     Type 1  . Asthma   . Allergy   . Thyroid disease     Hypothyroidism  . Breast cancer ([place])   . Family history of breast cancer   . Family history of kidney cancer     Past Surgical History  Procedure Laterality Date  . Tonsilectomy, adenoidectomy, bilateral myringotomy and tubes    . Appendectomy    . Shoulder arthroscopy  2004    b/L --second one 2011  . Tubal ligation      then reversed it  . Ruptured ovarian cyst    . Nasal sinus surgery      Family History  Problem Relation Age of Onset  . Cancer Mother     cervical  . Hypertension Mother   . Hyperlipidemia Mother   . Heart disease Father 50    MI---- cabg before that  . Hypothyroidism Sister   . Breast cancer Sister 76  . Heart disease Brother 19    MI--- second one 65  . Hyperlipidemia Brother   . Hypertension Brother   . Hypothyroidism Sister   . Kidney cancer Other 33    son of her sister with breast cancer    Social History   Social History  . Marital Status: Married    Spouse Name: [name]  . Number of Children: 0  . Years of Education: N/A   Occupational History  . 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 Smoking status: Never Smoker   . Smokeless tobacco: Never Used  . Alcohol Use: No  . Drug Use: No  . Sexual Activity:    Partners: Male   Other Topics Concern  . None   Social History Narrative     Current outpatient prescriptions:  .  fish oil-omega-3 fatty acids 1000 MG capsule, Take 1 g by mouth daily.  , Disp: , Rfl:  .  insulin aspart (NOVOLOG) 100  UNIT/ML injection, Per Dr Forde Dandy (Patient taking differently: Per Dr Forde Dandy), Disp: 3 vial, Rfl: PRN .  levothyroxine (SYNTHROID, LEVOTHROID) 200 MCG tablet, Take 200 mcg by mouth daily.  , Disp: , Rfl:  .  metroNIDAZOLE (FLAGYL) 500 MG tablet, Take 1 tablet (500 mg total) by mouth 3 (three) times daily., Disp: 21 tablet, Rfl: 0 .  montelukast (SINGULAIR) 10 MG tablet, Take 10 mg by mouth at bedtime.  , Disp: , Rfl:  .  Multiple Vitamin (MULTIVITAMIN) tablet, Take 1 tablet by mouth daily.  , Disp: , Rfl:  .  NON FORMULARY, Inject 0.4 application into the skin every hour. NOVOLOG PUMP , Disp: , Rfl:  .  venlafaxine XR (EFFEXOR-XR) 37.5 MG 24 hr capsule, Take 2 capsules (75 mg total) by mouth daily with breakfast., Disp: 180 capsule, Rfl: 0 .  nitrofurantoin, macrocrystal-monohydrate, (MACROBID) 100 MG capsule, Take 1 capsule (100 mg total) by mouth 2 (two) times daily., Disp: 14 capsule, Rfl: 0  EXAM:  Filed Vitals:   03/07/16 1149  BP: 136/76  Pulse: 71  Temp: 98 F (36.7 C)  Resp: 20    Body mass index is 23.98 kg/(m^2).  GENERAL: vitals reviewed and listed above, alert, oriented, appears well hydrated and in no acute distress  HEENT: atraumatic, conjunttiva clear, no obvious abnormalities on inspection of external nose and ears  NECK: no obvious masses on inspection  LUNGS: clear to auscultation bilaterally, no wheezes, rales or rhonchi, good air movement  CV: HRRR, no peripheral edema  ABD: BS+, soft, NTTP, no CVA TTP  MS: moves all extremities without noticeable abnormality  PSYCH: pleasant and cooperative, no obvious depression or anxiety  ASSESSMENT AND PLAN:  Discussed the following assessment and plan:  Dysuria - Plan: POCT Urinalysis Dipstick, CULTURE, URINE COMPREHENSIVE  -udip with leuks -she opted for empiric tx after discussion risks/benefits, culture pending -Patient advised to return or notify a doctor immediately if symptoms worsen or persist or new  concerns arise.  Patient Instructions  Take the antibiotic as instructed.  We have ordered labs or studies at this visit. It can take up to 1-2 weeks for results and processing. IF results require follow up or explanation, we will call you with instructions. Clinically stable results will be released to your [facility]. If you have not heard from [place] or cannot find your results in [facility] in 2 weeks please contact our office at [phone].  If you are not yet signed up for [facility], please consider signing up.           Colin Benton R., DO

## 2016-03-07 NOTE — Progress Notes (Signed)
Pre visit review using our clinic review tool, if applicable. No additional management support is needed unless otherwise documented below in the visit note. 

## 2016-03-08 LAB — URINE CULTURE: Culture: >=100000 — AB

## 2016-03-09 ENCOUNTER — Telehealth: Payer: Self-pay | Admitting: *Deleted

## 2016-03-09 NOTE — Telephone Encounter (Signed)
[employer] note received via fax  Call:   Date: 03/07/16 Time: 1123   Caller: Self Return number: [phone]  Nurse: Haroldine Laws, RN  Chief Complaint: Urination Pain  Reason for call: Was seen in June for UTI and has developed it again. Wants ABX and something for the pain.  Related visit to physician within the last 2 weeks: No  Guideline: Urination Pain-Female; diabetes mellitus or weak immune system (e.g., HIV positive, cancer chemotherapy, transplant patient)  Disposition: See Physician within 24 Hours  **Pt seen at Saturday clinic 03/07/16**

## 2016-03-16 ENCOUNTER — Encounter: Payer: Self-pay | Admitting: Oncology

## 2016-03-16 NOTE — Progress Notes (Signed)
matrix forms left. I left for dr. Jana Hakim to sign

## 2016-03-17 ENCOUNTER — Encounter (HOSPITAL_COMMUNITY): Payer: Self-pay

## 2016-03-17 NOTE — Pre-Procedure Instructions (Addendum)
    TRILLIAN LANPHEAR  03/17/2016      [id number] - [place], [address] [place] [address] Phone: [phone] Fax: [phone]  [id number] - [place], [address] [place] [address] Phone: [phone] Fax: [phone]    ht.  T Do not wear jewelry, make-up or nail polish.  Do not wear lotions, powders, or perfumes.  You may wear deoderant.  Do not shave 48 hours prior to surgery.  Men may shave face and neck.  Do not bring valuables to the hospital.  [facility] is not responsible for any belongings or valuables.  Contacts, dentures or bridgework may not be worn into surgery.  Leave your suitcase in the car.  After surgery it may be brought to your room.  For patients admitted to the hospital, discharge time will be determined by your treatment team.  Patients discharged the day of surgery will not be allowed to drive home.     Please read over the following fact sheets that you were given.

## 2016-03-17 NOTE — Pre-Procedure Instructions (Signed)
    ATHENEA SNITKER  03/17/2016      [id number] - [place], [place] [address] Phone: [phone] Fax: [phone]  [id number] - [place], [address] [place] [address] Phone: [phone] Fax: [phone]    Your procedure is scheduled on 03/26/16.  Report to [facility] Admitting at 530 A.M.  Call this number if you have problems the morning of surgery:  [phone]   Remember:  Do not eat food or drink liquids after midnight.  Take these medicines the morning of surgery with A SIP OF WATER --synthroid,effexor,all inhales   Do not wear jewelry, make-up or nail polish.  Do not wear lotions, powders, or perfumes.  You may wear deoderant.  Do not shave 48 hours prior to surgery.  Men may shave face and neck.  Do not bring valuables to the hospital.  [facility] is not responsible for any belongings or valuables.  Contacts, dentures or bridgework may not be worn into surgery.  Leave your suitcase in the car.  After surgery it may be brought to your room.  For patients admitted to the hospital, discharge time will be determined by your treatment team.  Patients discharged the day of surgery will not be allowed to drive home.   Name and phone number of your driver:   Special instructions: Do not take any aspirin,anti-inflammatories,vitamins,or herbal supplements 5-7 days prior to surgery.  Please read over the following fact sheets that you were given.

## 2016-03-18 ENCOUNTER — Encounter (HOSPITAL_COMMUNITY): Payer: Self-pay

## 2016-03-18 ENCOUNTER — Encounter (HOSPITAL_COMMUNITY)
Admission: RE | Admit: 2016-03-18 | Discharge: 2016-03-18 | Disposition: A | Discharge status: Home / Self Care | Payer: 59 | Source: Ambulatory Visit | Attending: General Surgery | Admitting: General Surgery

## 2016-03-18 DIAGNOSIS — C50911 Malignant neoplasm of unspecified site of right female breast: Secondary | ICD-10-CM | POA: Insufficient documentation

## 2016-03-18 DIAGNOSIS — E119 Type 2 diabetes mellitus without complications: Secondary | ICD-10-CM | POA: Insufficient documentation

## 2016-03-18 DIAGNOSIS — Z01812 Encounter for preprocedural laboratory examination: Secondary | ICD-10-CM | POA: Insufficient documentation

## 2016-03-18 DIAGNOSIS — Z01818 Encounter for other preprocedural examination: Secondary | ICD-10-CM | POA: Insufficient documentation

## 2016-03-18 HISTORY — DX: Hypothyroidism, unspecified: E03.9

## 2016-03-18 HISTORY — DX: Cardiac murmur, unspecified: R01.1

## 2016-03-18 LAB — CBC
HCT: 36.2 % (ref 36.0–46.0)
Hemoglobin: 11.5 g/dL — ABNORMAL LOW (ref 12.0–15.0)
MCH: 29.9 pg (ref 26.0–34.0)
MCHC: 31.8 g/dL (ref 30.0–36.0)
MCV: 94 fL (ref 78.0–100.0)
Platelets: 328 10*3/uL (ref 150–400)
RBC: 3.85 MIL/uL — ABNORMAL LOW (ref 3.87–5.11)
RDW: 12.8 % (ref 11.5–15.5)
WBC: 4.4 10*3/uL (ref 4.0–10.5)

## 2016-03-18 LAB — BASIC METABOLIC PANEL
Anion gap: 7 (ref 5–15)
BUN: 11 mg/dL (ref 6–20)
CO2: 26 mmol/L (ref 22–32)
Calcium: 9 mg/dL (ref 8.9–10.3)
Chloride: 106 mmol/L (ref 101–111)
Creatinine, Ser: 0.71 mg/dL (ref 0.44–1.00)
GFR calc Af Amer: >60 mL/min (ref >60)
GFR calc non Af Amer: >60 mL/min (ref >60)
Glucose, Bld: 179 mg/dL — ABNORMAL HIGH (ref 65–99)
Potassium: 4.9 mmol/L (ref 3.5–5.1)
Sodium: 139 mmol/L (ref 135–145)

## 2016-03-18 LAB — HEMOGLOBIN A1C
Hgb A1c MFr Bld: 6.7 % — ABNORMAL HIGH (ref 4.8–5.6)
Mean Plasma Glucose: 146 mg/dL

## 2016-03-18 LAB — GLUCOSE, CAPILLARY: Glucose-Capillary: 140 mg/dL — ABNORMAL HIGH (ref 65–99)

## 2016-03-18 MED ORDER — CHLORHEXIDINE GLUCONATE CLOTH 2 % EX PADS: 6.0000 | MEDICATED_PAD | Freq: Once | CUTANEOUS | Status: DC

## 2016-03-18 NOTE — Final Progress Note (Signed)
Diabetic coordinator notified of up coming surgery

## 2016-03-18 NOTE — Progress Notes (Signed)
Pt is calling Dr Forde Dandy requarding insulin pump. She will have it on basil rate.

## 2016-03-20 ENCOUNTER — Encounter: Payer: Self-pay | Admitting: Radiation Oncology

## 2016-03-20 NOTE — Progress Notes (Signed)
Paperwork (matrix) received 8/4 given to nurse

## 2016-03-23 ENCOUNTER — Encounter: Payer: Self-pay | Admitting: Oncology

## 2016-03-23 NOTE — Progress Notes (Signed)
matrix forms left. I left for dr. Jana Hakim to sign-faxed [phone].sent to medical recrds and mailed to pateint

## 2016-03-25 MED ORDER — CEFAZOLIN SODIUM-DEXTROSE 2-4 GM/100ML-% IV SOLN
2.0000 g | INTRAVENOUS | Status: AC
  Administered 2016-03-26: 2 g via INTRAVENOUS
  Filled 2016-03-25: qty 100

## 2016-03-26 ENCOUNTER — Encounter (HOSPITAL_COMMUNITY): Payer: Self-pay | Admitting: *Deleted

## 2016-03-26 ENCOUNTER — Ambulatory Visit (HOSPITAL_COMMUNITY): Payer: 59 | Admitting: Certified Registered"

## 2016-03-26 ENCOUNTER — Encounter (HOSPITAL_COMMUNITY)
Admission: RE | Disposition: A | Discharge status: Home / Self Care | Payer: Self-pay | Source: Ambulatory Visit | Attending: General Surgery

## 2016-03-26 ENCOUNTER — Ambulatory Visit (HOSPITAL_COMMUNITY)
Admission: RE | Admit: 2016-03-26 | Discharge: 2016-03-26 | Disposition: A | Discharge status: Home / Self Care | Payer: 59 | Source: Ambulatory Visit | Attending: General Surgery | Admitting: General Surgery

## 2016-03-26 DIAGNOSIS — E119 Type 2 diabetes mellitus without complications: Secondary | ICD-10-CM | POA: Diagnosis not present

## 2016-03-26 DIAGNOSIS — J45909 Unspecified asthma, uncomplicated: Secondary | ICD-10-CM | POA: Diagnosis not present

## 2016-03-26 DIAGNOSIS — Z803 Family history of malignant neoplasm of breast: Secondary | ICD-10-CM | POA: Diagnosis not present

## 2016-03-26 DIAGNOSIS — Z794 Long term (current) use of insulin: Secondary | ICD-10-CM | POA: Diagnosis not present

## 2016-03-26 DIAGNOSIS — E039 Hypothyroidism, unspecified: Secondary | ICD-10-CM | POA: Diagnosis not present

## 2016-03-26 DIAGNOSIS — Z170 Estrogen receptor positive status [ER+]: Secondary | ICD-10-CM | POA: Insufficient documentation

## 2016-03-26 DIAGNOSIS — C50411 Malignant neoplasm of upper-outer quadrant of right female breast: Secondary | ICD-10-CM

## 2016-03-26 HISTORY — PX: BREAST LUMPECTOMY: SHX2

## 2016-03-26 HISTORY — PX: BREAST LUMPECTOMY WITH AXILLARY LYMPH NODE BIOPSY: SHX5593

## 2016-03-26 LAB — GLUCOSE, CAPILLARY
Glucose-Capillary: 142 mg/dL — ABNORMAL HIGH (ref 65–99)
Glucose-Capillary: 209 mg/dL — ABNORMAL HIGH (ref 65–99)
Glucose-Capillary: 213 mg/dL — ABNORMAL HIGH (ref 65–99)

## 2016-03-26 SURGERY — BREAST LUMPECTOMY WITH AXILLARY LYMPH NODE BIOPSY
Anesthesia: Regional | Site: Breast | Laterality: Right

## 2016-03-26 MED ORDER — EPHEDRINE SULFATE 50 MG/ML IJ SOLN
INTRAMUSCULAR | Status: DC | PRN
  Administered 2016-03-26 (×5): 5 mg via INTRAVENOUS

## 2016-03-26 MED ORDER — LIDOCAINE 2% (20 MG/ML) 5 ML SYRINGE
INTRAMUSCULAR | Status: AC
  Filled 2016-03-26: qty 5

## 2016-03-26 MED ORDER — TECHNETIUM TC 99M SULFUR COLLOID FILTERED
1.0000 | Freq: Once | INTRAVENOUS | Status: AC | PRN
  Administered 2016-03-26: 1 via INTRADERMAL

## 2016-03-26 MED ORDER — SODIUM CHLORIDE 0.9 % IJ SOLN
INTRAMUSCULAR | Status: AC
  Filled 2016-03-26: qty 10

## 2016-03-26 MED ORDER — ONDANSETRON HCL 4 MG/2ML IJ SOLN
INTRAMUSCULAR | Status: DC | PRN
  Administered 2016-03-26: 4 mg via INTRAVENOUS

## 2016-03-26 MED ORDER — FENTANYL CITRATE (PF) 100 MCG/2ML IJ SOLN
INTRAMUSCULAR | Status: DC | PRN
  Administered 2016-03-26: 50 ug via INTRAVENOUS
  Administered 2016-03-26 (×3): 25 ug via INTRAVENOUS
  Administered 2016-03-26 (×2): 50 ug via INTRAVENOUS
  Administered 2016-03-26: 25 ug via INTRAVENOUS

## 2016-03-26 MED ORDER — ACETAMINOPHEN 325 MG PO TABS: 325.0000 mg | ORAL_TABLET | ORAL | Status: DC | PRN

## 2016-03-26 MED ORDER — HYDROCODONE-ACETAMINOPHEN 5-325 MG PO TABS: 1.0000 | ORAL_TABLET | Freq: Four times a day (QID) | ORAL | 0 refills | Status: DC | PRN

## 2016-03-26 MED ORDER — DEXAMETHASONE SODIUM PHOSPHATE 10 MG/ML IJ SOLN
INTRAMUSCULAR | Status: DC | PRN
  Administered 2016-03-26: 5 mg via INTRAVENOUS

## 2016-03-26 MED ORDER — LIDOCAINE HCL (CARDIAC) 20 MG/ML IV SOLN
INTRAVENOUS | Status: DC | PRN
Start: 2016-03-26 — End: 2016-03-26
  Administered 2016-03-26: 60 mg via INTRAVENOUS

## 2016-03-26 MED ORDER — FENTANYL CITRATE (PF) 250 MCG/5ML IJ SOLN
INTRAMUSCULAR | Status: AC
  Filled 2016-03-26: qty 5

## 2016-03-26 MED ORDER — BUPIVACAINE-EPINEPHRINE (PF) 0.5% -1:200000 IJ SOLN
INTRAMUSCULAR | Status: DC | PRN
Start: 2016-03-26 — End: 2016-03-26
  Administered 2016-03-26: 25 mL via PERINEURAL

## 2016-03-26 MED ORDER — 0.9 % SODIUM CHLORIDE (POUR BTL) OPTIME
TOPICAL | Status: DC | PRN
  Administered 2016-03-26: 1000 mL

## 2016-03-26 MED ORDER — OXYCODONE HCL 5 MG/5ML PO SOLN: 5.0000 mg | Freq: Once | ORAL | Status: DC | PRN

## 2016-03-26 MED ORDER — BUPIVACAINE-EPINEPHRINE (PF) 0.25% -1:200000 IJ SOLN
INTRAMUSCULAR | Status: AC
  Filled 2016-03-26: qty 30

## 2016-03-26 MED ORDER — EPHEDRINE 5 MG/ML INJ
INTRAVENOUS | Status: AC
  Filled 2016-03-26: qty 10

## 2016-03-26 MED ORDER — PROPOFOL 10 MG/ML IV BOLUS
INTRAVENOUS | Status: DC | PRN
  Administered 2016-03-26: 120 mg via INTRAVENOUS

## 2016-03-26 MED ORDER — BUPIVACAINE-EPINEPHRINE 0.25% -1:200000 IJ SOLN
INTRAMUSCULAR | Status: DC | PRN
  Administered 2016-03-26: 6 mL

## 2016-03-26 MED ORDER — OXYCODONE HCL 5 MG PO TABS: 5.0000 mg | ORAL_TABLET | Freq: Once | ORAL | Status: DC | PRN

## 2016-03-26 MED ORDER — HYDROCODONE-ACETAMINOPHEN 5-325 MG PO TABS: 1.0000 | ORAL_TABLET | ORAL | 0 refills | Status: DC | PRN

## 2016-03-26 MED ORDER — PROPOFOL 10 MG/ML IV BOLUS
INTRAVENOUS | Status: AC
  Filled 2016-03-26: qty 20

## 2016-03-26 MED ORDER — LACTATED RINGERS IV SOLN
INTRAVENOUS | Status: DC | PRN
  Administered 2016-03-26 (×2): via INTRAVENOUS

## 2016-03-26 MED ORDER — ACETAMINOPHEN 160 MG/5ML PO SOLN
325.0000 mg | ORAL | Status: DC | PRN
  Filled 2016-03-26: qty 20.3

## 2016-03-26 MED ORDER — FENTANYL CITRATE (PF) 100 MCG/2ML IJ SOLN: 25.0000 ug | INTRAMUSCULAR | Status: DC | PRN

## 2016-03-26 MED ORDER — MIDAZOLAM HCL 2 MG/2ML IJ SOLN
INTRAMUSCULAR | Status: AC
  Filled 2016-03-26: qty 2

## 2016-03-26 MED ORDER — MIDAZOLAM HCL 5 MG/5ML IJ SOLN
INTRAMUSCULAR | Status: DC | PRN
  Administered 2016-03-26: 2 mg via INTRAVENOUS

## 2016-03-26 MED ORDER — METHYLENE BLUE 0.5 % INJ SOLN
INTRAVENOUS | Status: AC
  Filled 2016-03-26: qty 10

## 2016-03-26 SURGICAL SUPPLY — 48 items
APPLIER CLIP 9.375 MED OPEN (MISCELLANEOUS) ×2
APR CLP MED 9.3 20 MLT OPN (MISCELLANEOUS) ×1
BLADE SURG 15 STRL LF DISP TIS (BLADE) IMPLANT
BLADE SURG 15 STRL SS (BLADE) ×2
CANISTER SUCTION 2500CC (MISCELLANEOUS) ×2 IMPLANT
CHLORAPREP W/TINT 26ML (MISCELLANEOUS) ×2 IMPLANT
CLIP APPLIE 9.375 MED OPEN (MISCELLANEOUS) IMPLANT
CONT SPEC 4OZ CLIKSEAL STRL BL (MISCELLANEOUS) ×8 IMPLANT
COVER PROBE W GEL 5X96 (DRAPES) ×2 IMPLANT
COVER SURGICAL LIGHT HANDLE (MISCELLANEOUS) ×2 IMPLANT
DEVICE DUBIN SPECIMEN MAMMOGRA (MISCELLANEOUS) ×1 IMPLANT
DRAPE CHEST BREAST 15X10 FENES (DRAPES) ×2 IMPLANT
DRAPE UTILITY XL STRL (DRAPES) ×3 IMPLANT
ELECT COATED BLADE 2.86 ST (ELECTRODE) ×2 IMPLANT
ELECT REM PT RETURN 9FT ADLT (ELECTROSURGICAL) ×2
ELECTRODE REM PT RTRN 9FT ADLT (ELECTROSURGICAL) ×1 IMPLANT
GLOVE BIO SURGEON STRL SZ7.5 (GLOVE) ×3 IMPLANT
GLOVE BIOGEL PI IND STRL 7.0 (GLOVE) IMPLANT
GLOVE BIOGEL PI INDICATOR 7.0 (GLOVE) ×2
GLOVE SURG SS PI 6.5 STRL IVOR (GLOVE) ×1 IMPLANT
GLOVE SURG SS PI 7.0 STRL IVOR (GLOVE) ×1 IMPLANT
GOWN STRL REUS W/ TWL LRG LVL3 (GOWN DISPOSABLE) ×2 IMPLANT
GOWN STRL REUS W/TWL LRG LVL3 (GOWN DISPOSABLE) ×4
KIT BASIN OR (CUSTOM PROCEDURE TRAY) ×2 IMPLANT
KIT MARKER MARGIN INK (KITS) ×1 IMPLANT
KIT ROOM TURNOVER OR (KITS) ×2 IMPLANT
LIQUID BAND (GAUZE/BANDAGES/DRESSINGS) ×2 IMPLANT
NDL 18GX1X1/2 (RX/OR ONLY) (NEEDLE) ×1 IMPLANT
NDL FILTER BLUNT 18X1 1/2 (NEEDLE) IMPLANT
NDL HYPO 25GX1X1/2 BEV (NEEDLE) ×2 IMPLANT
NEEDLE 18GX1X1/2 (RX/OR ONLY) (NEEDLE) IMPLANT
NEEDLE FILTER BLUNT 18X 1/2SAF (NEEDLE)
NEEDLE FILTER BLUNT 18X1 1/2 (NEEDLE) IMPLANT
NEEDLE HYPO 25GX1X1/2 BEV (NEEDLE) ×2 IMPLANT
NS IRRIG 1000ML POUR BTL (IV SOLUTION) ×2 IMPLANT
PACK SURGICAL SETUP 50X90 (CUSTOM PROCEDURE TRAY) ×2 IMPLANT
PAD ARMBOARD 7.5X6 YLW CONV (MISCELLANEOUS) ×2 IMPLANT
PENCIL BUTTON HOLSTER BLD 10FT (ELECTRODE) ×2 IMPLANT
SPONGE LAP 18X18 X RAY DECT (DISPOSABLE) ×2 IMPLANT
SUT MNCRL AB 4-0 PS2 18 (SUTURE) ×4 IMPLANT
SUT SILK 2 0 SH (SUTURE) IMPLANT
SUT VIC AB 3-0 SH 18 (SUTURE) ×4 IMPLANT
SYR BULB 3OZ (MISCELLANEOUS) ×2 IMPLANT
SYR CONTROL 10ML LL (SYRINGE) ×3 IMPLANT
TOWEL OR 17X24 6PK STRL BLUE (TOWEL DISPOSABLE) ×2 IMPLANT
TOWEL OR 17X26 10 PK STRL BLUE (TOWEL DISPOSABLE) ×2 IMPLANT
TUBE CONNECTING 12X1/4 (SUCTIONS) ×2 IMPLANT
YANKAUER SUCT BULB TIP NO VENT (SUCTIONS) ×2 IMPLANT

## 2016-03-26 NOTE — Transfer of Care (Signed)
Immediate Anesthesia Transfer of Care Note  Patient: Tiffany Browning  Procedure(s) Performed: Procedure(s): RIGHT BREAST LUMPECTOMY WITH AXILLARY LYMPH NODE BIOPSY (Right)  Patient Location: PACU  Anesthesia Type:General  Level of Consciousness:  sedated, patient cooperative and responds to stimulation  Airway & Oxygen Therapy:Patient Spontanous Breathing and Patient connected to face mask oxgen  Post-op Assessment:  Report given to PACU RN and Post -op Vital signs reviewed and stable  Post vital signs:  Reviewed and stable  Last Vitals:  Vitals:   03/26/16 0613 03/26/16 0938  BP: (!) 141/62 (!) 120/54  Pulse: 66 79  Resp: 20 12  Temp: 36.6 C 123456 C    Complications: No apparent anesthesia complications

## 2016-03-26 NOTE — Anesthesia Postprocedure Evaluation (Signed)
Anesthesia Post Note  Patient: Tiffany Browning  Procedure(s) Performed: Procedure(s) (LRB): RIGHT BREAST LUMPECTOMY WITH AXILLARY LYMPH NODE BIOPSY (Right)  Patient location during evaluation: PACU Anesthesia Type: General and Regional Level of consciousness: awake Pain management: pain level controlled Vital Signs Assessment: post-procedure vital signs reviewed and stable Respiratory status: spontaneous breathing Cardiovascular status: stable Postop Assessment: no signs of nausea or vomiting Anesthetic complications: no    Last Vitals:  Vitals:   03/26/16 1035 03/26/16 1043  BP: (!) 121/53 (!) (P) 123/50  Pulse: 85 86  Resp: 12 16  Temp: 36.4 C     Last Pain:  Vitals:   03/26/16 1043  TempSrc:   PainSc: 1                  Jo Booze

## 2016-03-26 NOTE — H&P (Signed)
Tiffany Browning  Location: [facility] Patient #: [id number] DOB: [birth year] Married / Language: English / Race: White Female   History of Present Illness  Patient words: Breast cancer.  The patient is a 60 year old female who presents with breast cancer. We are asked to see the patient in consultation by Dr. Dorise Bullion to evaluate her for a new right breast cancer. The patient is a 60 year old white female who recently went for a screening mammogram. At that time she was found to have a palpable mass in the outer aspect of the right breast. This was evaluated by mammogram and ultrasound. There were actually 2 masses found. The more lateral mass measured 3 cm and was found to be an invasive breast cancer. The more medial mass was a benign-appearing fibroadenoma. By ultrasound her lymph nodes looked normal. She denies any breast pain or discharge from the nipple. She does have a family history significant for breast cancer in her sister. She has quit taking hormone replacement. The cancer was ER and PR positive and HER-2 negative with a Ki-67 of 30%.   Other Problems Asthma Breast Cancer Diabetes Mellitus Heart murmur Lump In Breast  Past Surgical History Appendectomy Breast Biopsy Right. Hysterectomy (not due to cancer) - Complete  Diagnostic Studies History Colonoscopy >10 years ago Mammogram 1-3 years ago  Allergies  Clearasil *DERMATOLOGICALS*  Medication History  Levothyroxine Sodium (175MCG Tablet, Oral) Active. NovoLOG (100UNIT/ML Solution, Subcutaneous) Active. Montelukast Sodium (10MG Tablet, Oral) Active. Fish Oil Active. Multi Vitamin Daily (Oral) Active. Advair Diskus (100-50MCG/DOSE Aero Pow Br Act, Inhalation) Active. Medications Reconciled  Social History  Alcohol use Occasional alcohol use. Caffeine use Coffee. No drug use Tobacco use Never smoker.  Family History Alcohol Abuse Father. Breast Cancer  Sister. Cervical Cancer Mother. Heart Disease Brother, Father.  Pregnancy / Birth History  Age at menarche 89 years. Gravida 0 Para 0    Review of Systems General Not Present- Appetite Loss, Chills, Fatigue, Fever, Night Sweats, Weight Gain and Weight Loss. Skin Not Present- Change in Wart/Mole, Dryness, Hives, Jaundice, New Lesions, Non-Healing Wounds, Rash and Ulcer. HEENT Not Present- Earache, Hearing Loss, Hoarseness, Nose Bleed, Oral Ulcers, Ringing in the Ears, Seasonal Allergies, Sinus Pain, Sore Throat, Visual Disturbances, Wears glasses/contact lenses and Yellow Eyes. Respiratory Not Present- Bloody sputum, Chronic Cough, Difficulty Breathing, Snoring and Wheezing. Breast Not Present- Breast Mass, Breast Pain, Nipple Discharge and Skin Changes. Cardiovascular Not Present- Chest Pain, Difficulty Breathing Lying Down, Leg Cramps, Palpitations, Rapid Heart Rate, Shortness of Breath and Swelling of Extremities. Gastrointestinal Not Present- Abdominal Pain, Bloating, Bloody Stool, Change in Bowel Habits, Chronic diarrhea, Constipation, Difficulty Swallowing, Excessive gas, Gets full quickly at meals, Hemorrhoids, Indigestion, Nausea, Rectal Pain and Vomiting. Female Genitourinary Not Present- Frequency, Nocturia, Painful Urination, Pelvic Pain and Urgency. Musculoskeletal Not Present- Back Pain, Joint Pain, Joint Stiffness, Muscle Pain, Muscle Weakness and Swelling of Extremities. Neurological Not Present- Decreased Memory, Fainting, Headaches, Numbness, Seizures, Tingling, Tremor, Trouble walking and Weakness. Psychiatric Not Present- Anxiety, Bipolar, Change in Sleep Pattern, Depression, Fearful and Frequent crying. Endocrine Not Present- Cold Intolerance, Excessive Hunger, Hair Changes, Heat Intolerance, Hot flashes and New Diabetes. Hematology Not Present- Easy Bruising, Excessive bleeding, Gland problems, HIV and Persistent Infections.  Vitals Weight: 130.38 lb Height:  61.3in Body Surface Area: 1.58 m Body Mass Index: 24.39 kg/m  BP: 122/74 (Sitting, Left Arm, Standard)       Physical Exam  General Mental Status-Alert. General Appearance-Consistent with stated age. Hydration-Well hydrated.  Voice-Normal.  Head and Neck Head-normocephalic, atraumatic with no lesions or palpable masses. Trachea-midline. Thyroid Gland Characteristics - normal size and consistency.  Eye Eyeball - Bilateral-Extraocular movements intact. Sclera/Conjunctiva - Bilateral-No scleral icterus.  Chest and Lung Exam Chest and lung exam reveals -quiet, even and easy respiratory effort with no use of accessory muscles and on auscultation, normal breath sounds, no adventitious sounds and normal vocal resonance. Inspection Chest Wall - Normal. Back - normal.  Breast Note: There is a roughly 3 cm palpable mass in the outer aspect of the right breast. It does not appear to be tethered to the overlying skin over the chest wall. There is no palpable mass in the left breast. There is no palpable axillary, supraclavicular, or cervical lymphadenopathy on the right. There is a small mobile palpable lymph node in the left axilla.   Cardiovascular Cardiovascular examination reveals -normal heart sounds, regular rate and rhythm with no murmurs and normal pedal pulses bilaterally.  Abdomen Inspection Inspection of the abdomen reveals - No Hernias. Skin - Scar - no surgical scars. Palpation/Percussion Palpation and Percussion of the abdomen reveal - Soft, Non Tender, No Rebound tenderness, No Rigidity (guarding) and No hepatosplenomegaly. Auscultation Auscultation of the abdomen reveals - Bowel sounds normal.  Neurologic Neurologic evaluation reveals -alert and oriented x 3 with no impairment of recent or remote memory. Mental Status-Normal.  Musculoskeletal Normal Exam - Left-Upper Extremity Strength Normal and Lower Extremity Strength  Normal. Normal Exam - Right-Upper Extremity Strength Normal and Lower Extremity Strength Normal.  Lymphatic Head & Neck  General Head & Neck Lymphatics: Bilateral - Description - Normal. Axillary  General Axillary Region: Bilateral - Description - Normal. Tenderness - Non Tender. Femoral & Inguinal  Generalized Femoral & Inguinal Lymphatics: Bilateral - Description - Normal. Tenderness - Non Tender.    Assessment & Plan  BREAST CANCER OF UPPER-OUTER QUADRANT OF RIGHT FEMALE BREAST (C50.411) Impression: The patient appears to have a moderate size cancer in the outer aspect of the right breast with clinically negative lymph nodes. I have discussed with her in detail the different options for treatment and at this point she favors breast conservation. I think this is a reasonable way of treating her breast cancer but I believe she has a higher risk of difficulty clearing the margin given the size of the cancer versus her breast size. Since the cancer is palpable she would not need any sort of localization. I would plan for a right breast lumpectomy and sentinel node mapping. I have discussed with her in detail the risks and benefits of the operation as well as some of the technical aspects and she understands and wishes to proceed since her breast is so dense we will also get an MRI prior to scheduling surgery. Current Plans Referred to Oncology, for evaluation and follow up (Oncology). Routine. MRI, BOTH BREASTS ([id number]) Pt Education - Breast Cancer: discussed with patient and provided information.

## 2016-03-26 NOTE — Anesthesia Procedure Notes (Signed)
Procedure Name: LMA Insertion Date/Time: 03/26/2016 7:52 AM Performed by: Freddie Breech Pre-anesthesia Checklist: Patient identified, Emergency Drugs available, Suction available and Patient being monitored Patient Re-evaluated:Patient Re-evaluated prior to inductionOxygen Delivery Method: Circle System Utilized Preoxygenation: Pre-oxygenation with 100% oxygen Intubation Type: IV induction Ventilation: Mask ventilation without difficulty LMA: LMA inserted LMA Size: 4.0 Number of attempts: 1 Airway Equipment and Method: Bite block and Patient positioned with wedge pillow Placement Confirmation: positive ETCO2 and breath sounds checked- equal and bilateral Tube secured with: Tape Dental Injury: Teeth and Oropharynx as per pre-operative assessment

## 2016-03-26 NOTE — Anesthesia Procedure Notes (Signed)
Anesthesia Regional Block:  Pectoralis block  Pre-Anesthetic Checklist: ,, timeout performed, Correct Patient, Correct Site, Correct Laterality, Correct Procedure, Correct Position, site marked, Risks and benefits discussed,  Surgical consent,  Pre-op evaluation,  At surgeon's request and post-op pain management  Laterality: Right  Prep: chloraprep       Needles:  Injection technique: Single-shot  Needle Type: Echogenic Stimulator Needle          Additional Needles:  Procedures: ultrasound guided (picture in chart) Pectoralis block Narrative:  Injection made incrementally with aspirations every 5 mL.  Performed by: Personally  Anesthesiologist: Erinn Mendosa  Additional Notes: H+P and labs reviewed, risks and benefits discussed with patient, procedure tolerated well without complications      

## 2016-03-26 NOTE — Op Note (Signed)
03/26/2016  9:30 AM  PATIENT:  Tiffany Browning  60 y.o. female  PRE-OPERATIVE DIAGNOSIS:  RIGHT BREAST CANCER  POST-OPERATIVE DIAGNOSIS:  RIGHT BREAST CANCER  PROCEDURE:  Procedure(s): RIGHT BREAST LUMPECTOMY WITH AXILLARY LYMPH NODE BIOPSY (Right)  SURGEON:  Surgeon(s) and Role:    * [name], MD - Primary  PHYSICIAN ASSISTANT:   ASSISTANTS: none   ANESTHESIA:   general  EBL:  Total I/O In: 1000 [I.V.:1000] Out: 50 [Blood:50]  BLOOD ADMINISTERED:none  DRAINS: none   LOCAL MEDICATIONS USED:  MARCAINE     SPECIMEN:  Source of Specimen:  right breast tissue with additional superior and medial margins and sentinel nodes X 4  DISPOSITION OF SPECIMEN:  PATHOLOGY  COUNTS:  YES  TOURNIQUET:  * No tourniquets in log *  DICTATION: .Dragon Dictation   After informed consent was obtained the patient was brought to the operating room and placed in the supine position on the operating room table. After adequate induction of general anesthesia the patient's right chest, breast, and axillary area were prepped with ChloraPrep, allowed to dry, and draped in usual sterile manner. An appropriate timeout was performed. Earlier in the day the patient underwent injection of 1 mCi of technetium sulfur colloid in the subareolar position on the right. The cancer was palpable in the lateral aspect of the right breast and measured about 3 cm. A transversely oriented elliptical incision was made overlying the palpable mass with a 15 blade knife laterally on the right breast. The incision was carried through the skin and subcutaneous tissue sharply with the electrocautery. While palpating the tumor a circular portion of breast tissue was excised sharply around the cancer. This dissection was carried all the way to the chest wall. Once the specimen was removed it was oriented with the appropriate paint colors. There was still a lot of dense breast tissue in the area so an additional superior and  medial margin were removed and marked with the appropriate paint color. A specimen radiograph was obtained that showed the clip marking the cancer to be in the center of the specimen. There was an additional clip seen at the medial edge that marked an area of a fibroadenoma. Next the Neoprobe was set to technetium. The dissection was carried through the Lumpectomy cavity and into the axilla using the neoprobe to direct the dissection. I was able to identify 4 hot lymph nodes that were excised sharply with the electrocautery and the lymphatics were controlled with clips. Ex vivo counts on these nodes ranged from [phone]. These were sent as sentinel nodes numbers 1 through 4. The wound was then irrigated with copious amounts of saline and infiltrated with quarter percent Marcaine. The deep layer of the wound was closed with layers of interrupted 3-0 Vicryl stitches. The cavity was marked with clips. The skin was then closed with interrupted 4-0 Monocryl subcuticular stitches. Dermabond dressings were applied. The patient tolerated the procedure well. At the end of the case all needle sponge and instrument counts were correct. The patient was then awakened and taken to recovery in stable condition.  PLAN OF CARE: Discharge to home after PACU  PATIENT DISPOSITION:  PACU - hemodynamically stable.   Delay start of Pharmacological VTE agent (>24hrs) due to surgical blood loss or risk of bleeding: not applicable

## 2016-03-26 NOTE — Interval H&P Note (Signed)
History and Physical Interval Note:  03/26/2016 7:06 AM  Tiffany Browning  has presented today for surgery, with the diagnosis of RIGHT BREAST CANCER  The various methods of treatment have been discussed with the patient and family. After consideration of risks, benefits and other options for treatment, the patient has consented to  Procedure(s): BREAST LUMPECTOMY WITH AXILLARY LYMPH NODE BIOPSY (Right) as a surgical intervention .  The patient's history has been reviewed, patient examined, no change in status, stable for surgery.  I have reviewed the patient's chart and labs.  Questions were answered to the patient's satisfaction.     TOTH III,PAUL S

## 2016-03-26 NOTE — Anesthesia Preprocedure Evaluation (Signed)
Anesthesia Evaluation  Patient identified by MRN, date of birth, ID band Patient awake    Reviewed: Allergy & Precautions, NPO status , Patient's Chart, lab work & pertinent test results  History of Anesthesia Complications Negative for: history of anesthetic complications  Airway Mallampati: II  TM Distance: >3 FB Neck ROM: Full    Dental  (+) Teeth Intact   Pulmonary asthma ,    breath sounds clear to auscultation       Cardiovascular (-) hypertension(-) angina(-) Past MI and (-) CHF  Rhythm:Regular     Neuro/Psych PSYCHIATRIC DISORDERS Depression negative neurological ROS     GI/Hepatic negative GI ROS, Neg liver ROS,   Endo/Other  diabetes, Type 2, Insulin DependentHypothyroidism   Renal/GU negative Renal ROS     Musculoskeletal   Abdominal   Peds  Hematology   Anesthesia Other Findings   Reproductive/Obstetrics                             Anesthesia Physical Anesthesia Plan  ASA: II  Anesthesia Plan: General and Regional   Post-op Pain Management:    Induction: Intravenous  Airway Management Planned: LMA  Additional Equipment: None  Intra-op Plan:   Post-operative Plan: Extubation in OR  Informed Consent: I have reviewed the patients History and Physical, chart, labs and discussed the procedure including the risks, benefits and alternatives for the proposed anesthesia with the patient or authorized representative who has indicated his/her understanding and acceptance.   Dental advisory given  Plan Discussed with: CRNA and Surgeon  Anesthesia Plan Comments: (Continue insulin pump at patient basal setting, BG 140s this morning)        Anesthesia Quick Evaluation

## 2016-03-27 ENCOUNTER — Encounter (HOSPITAL_COMMUNITY): Payer: Self-pay | Admitting: General Surgery

## 2016-04-01 ENCOUNTER — Telehealth: Payer: Self-pay | Admitting: *Deleted

## 2016-04-01 NOTE — Telephone Encounter (Signed)
Ordered oncotype per Dr. [name].  Faxed requisition to pathology and confirmed receipt.  

## 2016-04-03 ENCOUNTER — Telehealth: Payer: Self-pay

## 2016-04-03 MED ORDER — GABAPENTIN 300 MG PO CAPS: 300.0000 mg | ORAL_CAPSULE | Freq: Every day | ORAL | 0 refills | Status: DC

## 2016-04-03 NOTE — Telephone Encounter (Signed)
Pt called stating her hotflashes were improved on venlafexine but still present and worse at night. She is getting grumpy d/t lack of sleep from hot flashes. S/w Dr Jana Hakim and received order for gabapentin. Called pt with information and e-scribed 90 day supply.

## 2016-04-10 ENCOUNTER — Encounter (HOSPITAL_COMMUNITY): Payer: Self-pay

## 2016-04-13 ENCOUNTER — Telehealth: Payer: Self-pay | Admitting: *Deleted

## 2016-04-13 NOTE — Telephone Encounter (Signed)
Received Oncotype Dx score of 11/8%.  Placed a copy in Dr. [name] box, gave Varney Biles a copy and took a copy to HIM to scan.

## 2016-04-14 ENCOUNTER — Telehealth: Payer: Self-pay | Admitting: *Deleted

## 2016-04-14 NOTE — Telephone Encounter (Signed)
Spoke with patient to discuss oncotype results of 11. She will not require chemo. Informed she would get a call to schedule an appointment with Dr. Isidore Moos. She would like to keep her appointment with Dr. Jana Hakim 9/5.

## 2016-04-21 ENCOUNTER — Ambulatory Visit: Payer: 59 | Admitting: Oncology

## 2016-04-21 ENCOUNTER — Encounter: Payer: Self-pay | Admitting: Oncology

## 2016-04-22 ENCOUNTER — Telehealth: Payer: Self-pay

## 2016-04-22 NOTE — Telephone Encounter (Signed)
Pt needs return to work letter on [employer]. LOA ends Sept 20th, she does see Dr Isidore Moos on 18th. The expected return to work date is the 21st. The fax # is [phone]. Attention [employer]. Please send a copy on MyChart to pt.

## 2016-04-23 ENCOUNTER — Telehealth: Payer: Self-pay

## 2016-04-23 NOTE — Telephone Encounter (Signed)
Original mailed to pt.

## 2016-04-23 NOTE — Telephone Encounter (Signed)
Faxed return to work letter.

## 2016-04-28 NOTE — Progress Notes (Signed)
Location of Breast Cancer: Right Breast  Histology per Pathology Report:  01/09/16 Diagnosis 1. Breast, right, needle core biopsy, 9:00 o'clock, 2 cm from nipple - FIBROADENOMA. - THERE IS NO EVIDENCE OF MALIGNANCY - SEE COMMENT. 2. Breast, right, needle core biopsy, 9:00 o'clock, 4 cm from nipple - INVASIVE AND IN SITU MAMMARY CARICNOMA. 03/26/16 Diagnosis 1. Breast, lumpectomy, Right INVASIVE DUCTAL CARCINOMA, GRADE 2, SPANNING 2.3 CM DUCTAL CARCINOMA IN SITU WITH CALCIFICATIONS, GRADE 2 LYMPHOVASCULAR INVASION IS IDENTIFIED THE CARCINOMA IS BROADLY PRESENTED WITHIN 1 MM AT THE POSTERIOR MARGIN 2. Lymph node, sentinel, biopsy, Right axillary #1 ONE BENIGN LYMPH NODE (0/1) 3. Breast, excision, Right superior margin FIBROCYSTIC CHANGES AND ADENOSIS NO EVIDENCE OF MALIGNANCY 4. Breast, excision, Right medial margin FIBROCYSTIC CHANGES WITH CALCIFICATIONS NO EVIDENCE OF MALIGNANCY 5. Lymph node, sentinel, biopsy, Right axillary #2 ONE BENIGN LYMPH NODE (0/1) 6. Lymph node, sentinel, biopsy, Right axillary #3 ONE BENIGN LYMPH NODE (0/1) 7. Lymph node, sentinel, biopsy, Right axillary #4 ONE BENIGN LYMPH NODE (0/1) Microscopic  Receptor Status: ER(100%), PR (100%), Her2-neu (NEG), Ki-(67%)  Did patient present with symptoms or was this found on screening mammography?: It was palpated by her GYN at her annual appointment.   Past/Anticipated interventions by surgeon, if any:  03/26/16 PROCEDURE:  Procedure(s): RIGHT BREAST LUMPECTOMY WITH AXILLARY LYMPH NODE BIOPSY (Right) SURGEON:  Surgeon(s) and Role:    Autumn Messing III, MD - Primary  Past/Anticipated interventions by medical oncology, if any: 01/28/16 Dr. Jana Hakim (1) neoadjuvant tamoxifen started 01/28/2016 (2) definitive surgery pending (completed 03/26/16) (3) Oncotype DX to be requested from final surgical sample (04/14/16 Oncotype test results of 10, she will not require chemotherapy) (4) adjuvant chemotherapy to  follow as appropriate (she will not receive chemotherapy per note from 04/14/16) (5) adjuvant radiation to follow (6) adjuvant anti-estrogens to be resumed at the completion of local treatment.   Lymphedema issues, if any:  She denies. She reports good arm movement in her Right Arm.   Pain issues, if any:  No  SAFETY ISSUES:  Prior radiation? No  Pacemaker/ICD? No  Possible current pregnancy? No  Is the patient on methotrexate? No  Current Complaints / other details:    BP (!) 143/61   Pulse (!) 59   Temp 97.9 F (36.6 C)   Ht 5' 1.25" (1.556 m)   Wt 129 lb 9.6 oz (58.8 kg)   SpO2 100% Comment: room air  BMI 24.29 kg/m    Wt Readings from Last 3 Encounters:  05/04/16 129 lb 9.6 oz (58.8 kg)  03/26/16 127 lb (57.6 kg)  03/18/16 129 lb 8 oz (58.7 kg)      Jhene Westmoreland, Stephani Police, RN 04/28/2016,2:55 PM

## 2016-05-04 ENCOUNTER — Ambulatory Visit
Admission: RE | Admit: 2016-05-04 | Discharge: 2016-05-04 | Disposition: A | Discharge status: Home / Self Care | Payer: 59 | Source: Ambulatory Visit | Attending: Radiation Oncology | Admitting: Radiation Oncology

## 2016-05-04 DIAGNOSIS — C50411 Malignant neoplasm of upper-outer quadrant of right female breast: Secondary | ICD-10-CM

## 2016-05-04 NOTE — Progress Notes (Signed)
Radiation Oncology         [phone] ________________________________  Name: Tiffany Browning MRN: [id number]  Date: 05/04/2016  DOB: Aug 30, 1955  Follow-Up Visit Note  Outpatient  CC: [name], DO  [name], [name], MD  Diagnosis:      ICD-9-CM ICD-10-CM   1. 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     Since consultation, she underwent right lumpectomy and sentinel lymph node biopsy on 03-26-16.  This revealed a 2.3 cm tumor with negative nodes and a broadly close (55m) margins to invasive disease. Margins negative to DCIS by 230m           Due to low oncotype score she will not receive chemotherapy.  Lymphedema issues, if any:  She denies. She reports good arm movement in her Right Arm.   Pain issues, if any:  No  She continues to work out at [employer] to work soon.  SAFETY ISSUES:  Prior radiation? No  Pacemaker/ICD? No  Possible current pregnancy? No Is the patient on methotrexate? No  ALLERGIES:  is allergic to clearsil [benzoyl peroxide].  Meds: Current Outpatient Prescriptions  Medication Sig Dispense Refill  . docusate sodium (COLACE) 100 MG capsule Take 100 mg by mouth daily.    . fish oil-omega-3 fatty acids 1000 MG capsule Take 1 g by mouth daily.      . Fluticasone-Salmeterol (ADVAIR) 100-50 MCG/DOSE AEPB Inhale 1 puff into the lungs daily as needed (astma symptoms).    . gabapentin (NEURONTIN) 300 MG capsule Take 1 capsule (300 mg total) by mouth at bedtime. 90 capsule 0  . insulin aspart (NOVOLOG) 100 UNIT/ML injection Per Dr SoForde DandyPatient taking differently: Inject 0.3-0.4 Units into the skin continuous. Insulin pump-Per Dr SoForde Dandy3 vial PRN  . levothyroxine (SYNTHROID, LEVOTHROID) 175 MCG tablet Take 175 mcg by mouth daily. Take  all days except on Sunday    . montelukast (SINGULAIR) 10 MG tablet Take 10 mg by mouth at bedtime.      . Multiple Vitamin (MULTIVITAMIN) tablet Take 1 tablet by mouth daily.      . NON FORMULARY Inject 0.4 application into the skin every hour. NOVOLOG PUMP     . tamoxifen (NOLVADEX) 20 MG tablet Take 20 mg by mouth daily.    . Marland Kitchenenlafaxine XR (EFFEXOR-XR) 37.5 MG 24 hr capsule Take 2 capsules (75 mg total) by mouth daily with breakfast. 180 capsule 0  . HYDROcodone-acetaminophen (NORCO) 5-325 MG tablet Take 1-2 tablets by mouth every 4 (four) hours as needed. (Patient not taking: Reported on 05/04/2016) 30 tablet 0   No current facility-administered medications for this encounter.     Physical Findings:  height is 5' 1.25" (1.556 m) and weight is 129 lb 9.6 oz (58.8 kg). Her temperature is 97.9 F (36.6 C). Her blood pressure is 143/61 (abnormal) and her pulse is 59 (abnormal). Her oxygen saturation is 100%. .     General: Alert and oriented, in no acute distress HEENT: Head is normocephalic. Extraocular movements are intact.  Neck: Neck is supple, no palpable cervical or supraclavicular lymphadenopathy. Heart: Regular in rate and rhythm with no murmurs, rubs, or gallops. Chest: Clear to auscultation bilaterally, with no rhonchi, wheezes, or rales. Extremities: No cyanosis or edema. Lymphatics: see  Neck Exam Musculoskeletal: symmetric strength and muscle tone throughout. Good ROM in shoulders Neurologic: No obvious focalities. Speech is fluent.  Psychiatric: Judgment and insight are intact. Affect is appropriate. Breast exam reveals well healed right lumpectomy scar.   Lab Findings: Lab Results  Component Value Date   WBC 4.4 03/18/2016   HGB 11.5 (L) 03/18/2016   HCT 36.2 03/18/2016   MCV 94.0 03/18/2016   PLT 328 03/18/2016    Radiographic Findings: No results found.  Impression/Plan: Early stage right breast cancer.  We discussed adjuvant radiotherapy today.  I recommend  4 weeks of radiotherapy to the right breast in order to reduce risk of locoregional recurrence by 2/3.  The risks, benefits and side effects of this treatment were discussed in detail.  She understands that radiotherapy is associated with skin irritation and fatigue in the acute setting. Late effects can include cosmetic changes and rare injury to internal organs.   She is enthusiastic about proceeding with treatment. A consent form has been signed and placed in her chart. Simulation will be ordered for the near future.  _____________________________________   [name], MD

## 2016-05-04 NOTE — Addendum Note (Signed)
Encounter addended by: Ernst Spell, RN on: 05/04/2016  8:41 AM<BR>    Actions taken: Reconcile Outside Information medication data discarded

## 2016-05-06 ENCOUNTER — Ambulatory Visit
Admission: RE | Admit: 2016-05-06 | Discharge: 2016-05-06 | Disposition: A | Discharge status: Home / Self Care | Payer: 59 | Source: Ambulatory Visit | Attending: Radiation Oncology | Admitting: Radiation Oncology

## 2016-05-06 DIAGNOSIS — Z510 Encounter for antineoplastic radiation therapy: Secondary | ICD-10-CM | POA: Diagnosis present

## 2016-05-06 DIAGNOSIS — C50411 Malignant neoplasm of upper-outer quadrant of right female breast: Secondary | ICD-10-CM

## 2016-05-06 NOTE — Progress Notes (Signed)
  Radiation Oncology         [phone] ________________________________  Name: Tiffany Browning MRN: [id number]  Date: 05/06/2016  DOB: [birth year]  SIMULATION AND TREATMENT PLANNING NOTE    outpatient  DIAGNOSIS:     ICD-9-CM ICD-10-CM   1. Breast cancer of upper-outer quadrant of right female breast ([place]) 174.4 C50.411     NARRATIVE:  The patient was brought to the [facility].  Identity was confirmed.  All relevant records and images related to the planned course of therapy were reviewed.  The patient freely provided informed written consent to proceed with treatment after reviewing the details related to the planned course of therapy. The consent form was witnessed and verified by the simulation staff.    Then, the patient was set-up in a stable reproducible supine position for radiation therapy with her ipsilateral arm over her head, and her upper body secured in a custom-made Vac-lok device.  CT images were obtained.  Surface markings were placed.  The CT images were loaded into the planning software.    TREATMENT PLANNING NOTE: Treatment planning then occurred.  The radiation prescription was entered and confirmed.     A total of 3 medically necessary complex treatment devices were fabricated and supervised by me: 2 fields with MLCs for custom blocks to protect heart, and lungs;  and, a Vac-lok. MORE COMPLEX DEVICES MAY BE MADE IN DOSIMETRY FOR FIELD IN FIELD BEAMS FOR DOSE HOMOGENEITY.  I have requested : 3D Simulation  I have requested a DVH of the following structures: lungs, heart, lumpectomy cavity.    The patient will receive 40.05 Gy in 15 fractions to the right breast with 2 tangential fields.   This will be followed by a boost.  Optical Surface Tracking Plan:  Since intensity modulated radiotherapy (IMRT) and 3D conformal radiation treatment methods are predicated on accurate and precise positioning for treatment, intrafraction motion monitoring is  medically necessary to ensure accurate and safe treatment delivery. The ability to quantify intrafraction motion without excessive ionizing radiation dose can only be performed with optical surface tracking. Accordingly, surface imaging offers the opportunity to obtain 3D measurements of patient position throughout IMRT and 3D treatments without excessive radiation exposure. I am ordering optical surface tracking for this patient's upcoming course of radiotherapy.  ________________________________   Reference:  Ursula Alert, J, et al. Surface imaging-based analysis of intrafraction motion for breast radiotherapy patients.Journal of [place], n. 6, nov. 2014. ISSN [id number].  Available at: <http://www.jacmp.org/index.php/jacmp/article/view/4957>.    -----------------------------------  [name], MD

## 2016-05-07 ENCOUNTER — Encounter: Payer: Self-pay | Admitting: Radiation Oncology

## 2016-05-07 NOTE — Progress Notes (Signed)
Paperwork was returned back to me in August due to patient not being treated, patient now being treated, paperwork (matrix) given back to the nurse 9/21

## 2016-05-11 DIAGNOSIS — Z510 Encounter for antineoplastic radiation therapy: Secondary | ICD-10-CM | POA: Diagnosis not present

## 2016-05-13 ENCOUNTER — Ambulatory Visit
Admission: RE | Admit: 2016-05-13 | Discharge: 2016-05-13 | Disposition: A | Discharge status: Home / Self Care | Payer: 59 | Source: Ambulatory Visit | Attending: Radiation Oncology | Admitting: Radiation Oncology

## 2016-05-13 DIAGNOSIS — Z510 Encounter for antineoplastic radiation therapy: Secondary | ICD-10-CM | POA: Diagnosis not present

## 2016-05-14 ENCOUNTER — Ambulatory Visit
Admission: RE | Admit: 2016-05-14 | Discharge: 2016-05-14 | Disposition: A | Discharge status: Home / Self Care | Payer: 59 | Source: Ambulatory Visit | Attending: Radiation Oncology | Admitting: Radiation Oncology

## 2016-05-14 DIAGNOSIS — Z510 Encounter for antineoplastic radiation therapy: Secondary | ICD-10-CM | POA: Diagnosis not present

## 2016-05-14 DIAGNOSIS — C50411 Malignant neoplasm of upper-outer quadrant of right female breast: Secondary | ICD-10-CM | POA: Diagnosis present

## 2016-05-14 MED ORDER — RADIAPLEXRX EX GEL
Freq: Once | CUTANEOUS | Status: AC
  Administered 2016-05-14: 15:00:00 via TOPICAL

## 2016-05-14 MED ORDER — ALRA NON-METALLIC DEODORANT (RAD-ONC)
1.0000 "application " | Freq: Once | TOPICAL | Status: AC
  Administered 2016-05-14: 1 via TOPICAL

## 2016-05-14 NOTE — Progress Notes (Signed)

## 2016-05-15 ENCOUNTER — Ambulatory Visit
Admission: RE | Admit: 2016-05-15 | Discharge: 2016-05-15 | Disposition: A | Discharge status: Home / Self Care | Payer: 59 | Source: Ambulatory Visit | Attending: Radiation Oncology | Admitting: Radiation Oncology

## 2016-05-15 DIAGNOSIS — Z510 Encounter for antineoplastic radiation therapy: Secondary | ICD-10-CM | POA: Diagnosis not present

## 2016-05-18 ENCOUNTER — Ambulatory Visit
Admission: RE | Admit: 2016-05-18 | Discharge: 2016-05-18 | Disposition: A | Discharge status: Home / Self Care | Payer: 59 | Source: Ambulatory Visit | Attending: Radiation Oncology | Admitting: Radiation Oncology

## 2016-05-18 VITALS — BP 114/59 | HR 65 | Temp 97.9°F | Resp 18 | Ht 61.25 in | Wt 129.8 lb

## 2016-05-18 DIAGNOSIS — Z170 Estrogen receptor positive status [ER+]: Secondary | ICD-10-CM

## 2016-05-18 DIAGNOSIS — Z510 Encounter for antineoplastic radiation therapy: Secondary | ICD-10-CM | POA: Diagnosis not present

## 2016-05-18 DIAGNOSIS — C50411 Malignant neoplasm of upper-outer quadrant of right female breast: Secondary | ICD-10-CM

## 2016-05-18 NOTE — Progress Notes (Signed)
Mrs. Rosenberg has received 3 fractions to her right breast.  Right breast with normal skin color using Radiaplex gel bid as instructed.  Appetite is good.  Denies pain to right breast and fatigue. Wt Readings from Last 3 Encounters:  05/18/16 129 lb 12.8 oz (58.9 kg)  05/04/16 129 lb 9.6 oz (58.8 kg)  03/26/16 127 lb (57.6 kg)  BP (!) 114/59 (BP Location: Left Arm, Patient Position: Sitting, Cuff Size: Normal)   Pulse 65   Temp 97.9 F (36.6 C) (Oral)   Resp 18   Ht 5' 1.25" (1.556 m)   Wt 129 lb 12.8 oz (58.9 kg)   SpO2 96%   BMI 24.33 kg/m

## 2016-05-18 NOTE — Progress Notes (Signed)
   Weekly Management Note:  outpatient    ICD-9-CM ICD-10-CM   1. Malignant neoplasm of upper-outer quadrant of right breast in female, estrogen receptor positive ([facility]) 174.4 C50.411    V86.0 Z17.0     Current Dose:  8.01 Gy  Projected Dose: 50.05 Gy   Narrative:  The patient presents for routine under treatment assessment.  CBCT/MVCT images/Port film x-rays were reviewed.  The chart was checked. Doing well.  Physical Findings:  height is 5' 1.25" (1.556 m) and weight is 129 lb 12.8 oz (58.9 kg). Her oral temperature is 97.9 F (36.6 C). Her blood pressure is 114/59 (abnormal) and her pulse is 65. Her respiration is 18 and oxygen saturation is 96%.   Wt Readings from Last 3 Encounters:  05/18/16 129 lb 12.8 oz (58.9 kg)  05/04/16 129 lb 9.6 oz (58.8 kg)  03/26/16 127 lb (57.6 kg)   No skin irritation over right breast today  Impression:  The patient is tolerating radiotherapy.  Plan:  Continue radiotherapy as planned. Patient instructed to apply Radiplex to intact skin in treatment fields.      ________________________________   [name], M.D.

## 2016-05-19 ENCOUNTER — Ambulatory Visit
Admission: RE | Admit: 2016-05-19 | Discharge: 2016-05-19 | Disposition: A | Discharge status: Home / Self Care | Payer: 59 | Source: Ambulatory Visit | Attending: Radiation Oncology | Admitting: Radiation Oncology

## 2016-05-19 DIAGNOSIS — Z510 Encounter for antineoplastic radiation therapy: Secondary | ICD-10-CM | POA: Diagnosis not present

## 2016-05-20 ENCOUNTER — Ambulatory Visit
Admission: RE | Admit: 2016-05-20 | Discharge: 2016-05-20 | Disposition: A | Discharge status: Home / Self Care | Payer: 59 | Source: Ambulatory Visit | Attending: Radiation Oncology | Admitting: Radiation Oncology

## 2016-05-20 ENCOUNTER — Encounter: Payer: Self-pay | Admitting: Radiation Oncology

## 2016-05-20 DIAGNOSIS — Z510 Encounter for antineoplastic radiation therapy: Secondary | ICD-10-CM | POA: Diagnosis not present

## 2016-05-20 NOTE — Progress Notes (Signed)
Electron Holiday representative Note  Diagnosis: Breast Cancer  Breast cancer of upper-outer quadrant of right female breast ([place]) 174.4 C50.411   Outpatient  The patient's CT images from her initial simulation were reviewed to plan her boost treatment to her right breast  lumpectomy cavity.  Measurements were made regarding the size and depth of the surgical bed. The boost to the lumpectomy cavity will be delivered with 12 MeV electrons; 10 Gy in 5 fractions has been prescribed to the 100% isodose line.   An electron Best boy was reviewed and approved.  A custom electron cut-out will be used for her boost field.    -----------------------------------  [name], MD

## 2016-05-21 ENCOUNTER — Ambulatory Visit
Admission: RE | Admit: 2016-05-21 | Discharge: 2016-05-21 | Disposition: A | Discharge status: Home / Self Care | Payer: 59 | Source: Ambulatory Visit | Attending: Radiation Oncology | Admitting: Radiation Oncology

## 2016-05-21 DIAGNOSIS — Z510 Encounter for antineoplastic radiation therapy: Secondary | ICD-10-CM | POA: Diagnosis not present

## 2016-05-22 ENCOUNTER — Ambulatory Visit
Admission: RE | Admit: 2016-05-22 | Discharge: 2016-05-22 | Disposition: A | Discharge status: Home / Self Care | Payer: 59 | Source: Ambulatory Visit | Attending: Radiation Oncology | Admitting: Radiation Oncology

## 2016-05-22 DIAGNOSIS — Z510 Encounter for antineoplastic radiation therapy: Secondary | ICD-10-CM | POA: Diagnosis not present

## 2016-05-25 ENCOUNTER — Ambulatory Visit
Admission: RE | Admit: 2016-05-25 | Discharge: 2016-05-25 | Disposition: A | Discharge status: Home / Self Care | Payer: 59 | Source: Ambulatory Visit | Attending: Radiation Oncology | Admitting: Radiation Oncology

## 2016-05-25 DIAGNOSIS — C50411 Malignant neoplasm of upper-outer quadrant of right female breast: Secondary | ICD-10-CM

## 2016-05-25 DIAGNOSIS — Z170 Estrogen receptor positive status [ER+]: Secondary | ICD-10-CM

## 2016-05-25 DIAGNOSIS — Z510 Encounter for antineoplastic radiation therapy: Secondary | ICD-10-CM | POA: Diagnosis not present

## 2016-05-25 NOTE — Progress Notes (Signed)
   Weekly Management Note:  outpatient    ICD-9-CM ICD-10-CM   1. Malignant neoplasm of upper-outer quadrant of right breast in female, estrogen receptor positive (HCC) 174.4 C50.411    V86.0 Z17.0     Current Dose: 21.36  Gy  Projected Dose: 50.05 Gy   Narrative:  The patient presents for routine under treatment assessment at 8th fraction.  CBCT/MVCT images/Port film x-rays were reviewed.  The chart was checked. Doing well.  Physical Findings:  vitals were not taken for this visit.  Wt Readings from Last 3 Encounters:  05/18/16 129 lb 12.8 oz (58.9 kg)  05/04/16 129 lb 9.6 oz (58.8 kg)  03/26/16 127 lb (57.6 kg)   erythema over right breast today with residual glue at central aspect of lumpectomy scar  Impression:  The patient is tolerating radiotherapy.  Plan:  Continue radiotherapy as planned. Patient instructed to apply Radiplex to intact skin in treatment fields.   Apply neosporin if skin peels in areas.   ________________________________   [name], M.D.

## 2016-05-26 ENCOUNTER — Ambulatory Visit
Admission: RE | Admit: 2016-05-26 | Discharge: 2016-05-26 | Disposition: A | Discharge status: Home / Self Care | Payer: 59 | Source: Ambulatory Visit | Attending: Radiation Oncology | Admitting: Radiation Oncology

## 2016-05-26 DIAGNOSIS — Z510 Encounter for antineoplastic radiation therapy: Secondary | ICD-10-CM | POA: Diagnosis not present

## 2016-05-27 ENCOUNTER — Ambulatory Visit
Admission: RE | Admit: 2016-05-27 | Discharge: 2016-05-27 | Disposition: A | Discharge status: Home / Self Care | Payer: 59 | Source: Ambulatory Visit | Attending: Radiation Oncology | Admitting: Radiation Oncology

## 2016-05-27 DIAGNOSIS — Z510 Encounter for antineoplastic radiation therapy: Secondary | ICD-10-CM | POA: Diagnosis not present

## 2016-05-28 ENCOUNTER — Ambulatory Visit
Admission: RE | Admit: 2016-05-28 | Discharge: 2016-05-28 | Disposition: A | Discharge status: Home / Self Care | Payer: 59 | Source: Ambulatory Visit | Attending: Radiation Oncology | Admitting: Radiation Oncology

## 2016-05-28 DIAGNOSIS — Z510 Encounter for antineoplastic radiation therapy: Secondary | ICD-10-CM | POA: Diagnosis not present

## 2016-05-29 ENCOUNTER — Ambulatory Visit
Admission: RE | Admit: 2016-05-29 | Discharge: 2016-05-29 | Disposition: A | Discharge status: Home / Self Care | Payer: 59 | Source: Ambulatory Visit | Attending: Radiation Oncology | Admitting: Radiation Oncology

## 2016-05-29 DIAGNOSIS — Z510 Encounter for antineoplastic radiation therapy: Secondary | ICD-10-CM | POA: Diagnosis not present

## 2016-06-01 ENCOUNTER — Other Ambulatory Visit: Payer: Self-pay | Admitting: Oncology

## 2016-06-01 ENCOUNTER — Ambulatory Visit
Admission: RE | Admit: 2016-06-01 | Discharge: 2016-06-01 | Disposition: A | Discharge status: Home / Self Care | Payer: 59 | Source: Ambulatory Visit | Attending: Radiation Oncology | Admitting: Radiation Oncology

## 2016-06-01 ENCOUNTER — Encounter: Payer: Self-pay | Admitting: Radiation Oncology

## 2016-06-01 VITALS — BP 135/75 | HR 62 | Temp 98.4°F | Resp 10 | Wt 131.8 lb

## 2016-06-01 DIAGNOSIS — Z170 Estrogen receptor positive status [ER+]: Secondary | ICD-10-CM | POA: Diagnosis not present

## 2016-06-01 DIAGNOSIS — Z510 Encounter for antineoplastic radiation therapy: Secondary | ICD-10-CM | POA: Diagnosis not present

## 2016-06-01 DIAGNOSIS — C50411 Malignant neoplasm of upper-outer quadrant of right female breast: Secondary | ICD-10-CM | POA: Diagnosis present

## 2016-06-01 MED ORDER — RADIAPLEXRX EX GEL
Freq: Once | CUTANEOUS | Status: AC
  Administered 2016-06-01: 13:00:00 via TOPICAL

## 2016-06-01 NOTE — Progress Notes (Signed)
   Weekly Management Note:  outpatient    ICD-9-CM ICD-10-CM   1. Malignant neoplasm of upper-outer quadrant of right breast in female, estrogen receptor positive (HCC) 174.4 C50.411    V86.0 Z17.0     Current Dose: 34.71 Gy  Projected Dose: 50.05 Gy   Narrative:  The patient presents for routine under treatment assessment at 8th fraction.  CBCT/MVCT images/Port film x-rays were reviewed.  The chart was checked. Doing well.  Physical Findings:  weight is 131 lb 12.8 oz (59.8 kg). Her oral temperature is 98.4 F (36.9 C). Her blood pressure is 135/75 and her pulse is 62. Her respiration is 10 and oxygen saturation is 100%.   Wt Readings from Last 3 Encounters:  06/01/16 131 lb 12.8 oz (59.8 kg)  05/18/16 129 lb 12.8 oz (58.9 kg)  05/04/16 129 lb 9.6 oz (58.8 kg)   Scabbing/erythema over part of right lumpectomy scar, no current discharge , no fluctuance Erythema, faint, right breast.  Impression:  The patient is tolerating radiotherapy.  Plan:  Continue radiotherapy as planned. Patient instructed to apply Radiplex to intact skin in treatment fields.   Apply neosporin if skin peels in areas.   ________________________________   [name], M.D.

## 2016-06-01 NOTE — Addendum Note (Signed)
Encounter addended by: Jenene Slicker, RN on: 06/01/2016 12:39 PM<BR>    Actions taken: Order Reconciliation Section accessed, Order Entry activity accessed, Diagnosis association updated

## 2016-06-01 NOTE — Addendum Note (Signed)
Encounter addended by: Jenene Slicker, RN on: 06/01/2016 12:49 PM<BR>    Actions taken: MAR administration accepted

## 2016-06-01 NOTE — Progress Notes (Signed)
PAIN: She is currently in no pain. SKIN: Pt right breast- positive for slight Pruritus and erythema, small pea sized opened area to the right of right nipple, pt reports a small amount of clear drainage from site.  Pt denies edema.  Pt continues to apply Radiaplex and Neosporin as directed. OTHER: Pt complains of fatigue-slight. BP 135/75   Pulse 62   Temp 98.4 F (36.9 C) (Oral)   Resp 10   Wt 131 lb 12.8 oz (59.8 kg)   SpO2 100%   BMI 24.70 kg/m  Wt Readings from Last 3 Encounters:  06/01/16 131 lb 12.8 oz (59.8 kg)  05/18/16 129 lb 12.8 oz (58.9 kg)  05/04/16 129 lb 9.6 oz (58.8 kg)

## 2016-06-02 ENCOUNTER — Ambulatory Visit
Admission: RE | Admit: 2016-06-02 | Discharge: 2016-06-02 | Disposition: A | Discharge status: Home / Self Care | Payer: 59 | Source: Ambulatory Visit | Attending: Radiation Oncology | Admitting: Radiation Oncology

## 2016-06-02 DIAGNOSIS — Z510 Encounter for antineoplastic radiation therapy: Secondary | ICD-10-CM | POA: Diagnosis not present

## 2016-06-03 ENCOUNTER — Ambulatory Visit
Admission: RE | Admit: 2016-06-03 | Discharge: 2016-06-03 | Disposition: A | Discharge status: Home / Self Care | Payer: 59 | Source: Ambulatory Visit | Attending: Radiation Oncology | Admitting: Radiation Oncology

## 2016-06-03 DIAGNOSIS — Z510 Encounter for antineoplastic radiation therapy: Secondary | ICD-10-CM | POA: Diagnosis not present

## 2016-06-04 ENCOUNTER — Ambulatory Visit
Admission: RE | Admit: 2016-06-04 | Discharge: 2016-06-04 | Disposition: A | Discharge status: Home / Self Care | Payer: 59 | Source: Ambulatory Visit | Attending: Radiation Oncology | Admitting: Radiation Oncology

## 2016-06-04 DIAGNOSIS — Z510 Encounter for antineoplastic radiation therapy: Secondary | ICD-10-CM | POA: Diagnosis not present

## 2016-06-05 ENCOUNTER — Ambulatory Visit
Admission: RE | Admit: 2016-06-05 | Discharge: 2016-06-05 | Disposition: A | Discharge status: Home / Self Care | Payer: 59 | Source: Ambulatory Visit | Attending: Radiation Oncology | Admitting: Radiation Oncology

## 2016-06-05 DIAGNOSIS — Z510 Encounter for antineoplastic radiation therapy: Secondary | ICD-10-CM | POA: Diagnosis not present

## 2016-06-08 ENCOUNTER — Ambulatory Visit
Admission: RE | Admit: 2016-06-08 | Discharge: 2016-06-08 | Disposition: A | Discharge status: Home / Self Care | Payer: 59 | Source: Ambulatory Visit | Attending: Radiation Oncology | Admitting: Radiation Oncology

## 2016-06-08 ENCOUNTER — Encounter: Payer: Self-pay | Admitting: Radiation Oncology

## 2016-06-08 VITALS — BP 124/79 | HR 72 | Temp 97.9°F | Resp 16 | Wt 128.0 lb

## 2016-06-08 DIAGNOSIS — Z510 Encounter for antineoplastic radiation therapy: Secondary | ICD-10-CM | POA: Diagnosis not present

## 2016-06-08 DIAGNOSIS — Z170 Estrogen receptor positive status [ER+]: Secondary | ICD-10-CM

## 2016-06-08 DIAGNOSIS — C50411 Malignant neoplasm of upper-outer quadrant of right female breast: Secondary | ICD-10-CM

## 2016-06-08 NOTE — Progress Notes (Signed)
Weekly rad txs right breast 18/20   Mild erythema, scab over incision, using radiaplex bid, , no pain, but has been nauseated this am, stated she is trying  To lose weight, gave 1 month f/u appt card 8:14 AM BP 124/79 (BP Location: Left Arm, Patient Position: Sitting, Cuff Size: Normal)   Pulse 72   Temp 97.9 F (36.6 C) (Oral)   Resp 16   Wt 128 lb (58.1 kg)   BMI 23.99 kg/m   Wt Readings from Last 3 Encounters:  06/08/16 128 lb (58.1 kg)  06/01/16 131 lb 12.8 oz (59.8 kg)  05/18/16 129 lb 12.8 oz (58.9 kg)

## 2016-06-08 NOTE — Progress Notes (Signed)
   Weekly Management Note:  outpatient    ICD-9-CM ICD-10-CM   1. Malignant neoplasm of upper-outer quadrant of right breast in female, estrogen receptor positive (HCC) 174.4 C50.411    V86.0 Z17.0     Current Dose: 46.05 Gy  Projected Dose: 50.05 Gy   Narrative:  The patient presents for routine under treatment assessment at 8th fraction.  CBCT/MVCT images/Port film x-rays were reviewed.  The chart was checked. Doing well.  Physical Findings:  weight is 128 lb (58.1 kg). Her oral temperature is 97.9 F (36.6 C). Her blood pressure is 124/79 and her pulse is 72. Her respiration is 16.   Wt Readings from Last 3 Encounters:  06/08/16 128 lb (58.1 kg)  06/01/16 131 lb 12.8 oz (59.8 kg)  05/18/16 129 lb 12.8 oz (58.9 kg)   Scabbing/erythema over part of right lumpectomy scar, no current discharge , no fluctuance Erythema, faint, right breast; stable appearance  Impression:  The patient is tolerating radiotherapy.  Plan:  Continue radiotherapy as planned. Patient instructed to apply Radiplex to intact skin in treatment fields.   Apply neosporin if skin peels in areas. F/u in 25mo.  Card given.   ________________________________   [name], M.D.

## 2016-06-09 ENCOUNTER — Ambulatory Visit
Admission: RE | Admit: 2016-06-09 | Discharge: 2016-06-09 | Disposition: A | Discharge status: Home / Self Care | Payer: 59 | Source: Ambulatory Visit | Attending: Radiation Oncology | Admitting: Radiation Oncology

## 2016-06-09 DIAGNOSIS — Z510 Encounter for antineoplastic radiation therapy: Secondary | ICD-10-CM | POA: Diagnosis not present

## 2016-06-10 ENCOUNTER — Encounter: Payer: Self-pay | Admitting: Radiation Oncology

## 2016-06-10 ENCOUNTER — Telehealth: Payer: Self-pay | Admitting: *Deleted

## 2016-06-10 ENCOUNTER — Ambulatory Visit
Admission: RE | Admit: 2016-06-10 | Discharge: 2016-06-10 | Disposition: A | Discharge status: Home / Self Care | Payer: 59 | Source: Ambulatory Visit | Attending: Radiation Oncology | Admitting: Radiation Oncology

## 2016-06-10 DIAGNOSIS — Z510 Encounter for antineoplastic radiation therapy: Secondary | ICD-10-CM | POA: Diagnosis not present

## 2016-06-10 NOTE — Telephone Encounter (Signed)
Relate doing well after xrt. Denies questions or needs at this time. Encourage pt to call with concerns.

## 2016-06-17 ENCOUNTER — Ambulatory Visit (HOSPITAL_BASED_OUTPATIENT_CLINIC_OR_DEPARTMENT_OTHER): Payer: 59 | Admitting: Oncology

## 2016-06-17 VITALS — BP 130/55 | HR 73 | Temp 98.1°F | Resp 18 | Ht 61.25 in | Wt 128.0 lb

## 2016-06-17 DIAGNOSIS — Z170 Estrogen receptor positive status [ER+]: Secondary | ICD-10-CM

## 2016-06-17 DIAGNOSIS — C50411 Malignant neoplasm of upper-outer quadrant of right female breast: Secondary | ICD-10-CM

## 2016-06-17 DIAGNOSIS — Z8051 Family history of malignant neoplasm of kidney: Secondary | ICD-10-CM

## 2016-06-17 DIAGNOSIS — Z79810 Long term (current) use of selective estrogen receptor modulators (SERMs): Secondary | ICD-10-CM

## 2016-06-17 NOTE — Progress Notes (Signed)
Both one was supposed to be a nice day the morning was fine.  [place]  [phone] [phone]     ID: Tiffany Browning DOB: [birth year]  MR#: [id number]  JYN#:[id number]  Patient Care Team: [name], DO as PCP - General (Family Medicine) Reynold Bowen, MD as Consulting Physician (Endocrinology) Autumn Messing III, MD as Consulting Physician (General Surgery) Chauncey Cruel, MD as Consulting Physician (Oncology) [name], MD as Attending Physician (Radiation Oncology) Benson Norway, RN as Registered Nurse (Oncology) PCP: [name], DO GYN: OTHER MD:  CHIEF COMPLAINT: Estrogen receptor positive breast cancer  CURRENT TREATMENT:  tamoxifen   BREAST CANCER HISTORY: From the original intake note:  Tiffany Browning had routine physical exam by her gynecologist in May 2017 and she was able to palpate a mass in the upper-outer quadrant of her right breast. The patient had had bilateral screening mammography 06/26/2015 at the [facility], with no suspicious findings. On 12/31/2015 she underwent bilateral diagnostic mammography with tomography and right breast ultrasonography. The breast density was category D. On focal spot compression in the area of palpable concern there was a possible mass, hard to make out because of the breast density. On exam there was a firm palpable lump measuring approximately 3 cm at the 9:00 region of the right breast, 4 cm from the nipple. The overlying skin was unremarkable. Ultrasonography confirmed an irregular hypoechoic mass in the area in question measuring 3.0 cm.  Also, separate from the palpable mass at the 9:00 position but 2 cm from the nipple was a hypoechoic oval mass measuring 2.0 cm. Ultrasound of the right axilla was negative.  Biopsy of both the masses in question from the right rest were obtained 01/09/2016. This showed (SAA 17-90-2) the mass closer to the nipple, S2 centimeters, to be a  fibroadenoma. The more distant mass, 4 cm from the nipple, was invasive ductal carcinoma, E-cadherin positive, grade 2, estrogen receptor 100% positive, progesterone receptor 100% positive, with an MIB-1 of 30%, and no HER-2 amplification, the signals ratio being 1.43 and the number per cell 2.15.  The patient's subsequent history is as detailed below  INTERVAL HISTORY: Tiffany Browning returns today for follow-up of her estrogen receptor positive breast cancer. After her last visit here she had biopsy of an anterior upper outer quadrant right breast lesion 02/14/2016, and this showed ([id number]) no evidence of malignancy.  Accordingly she proceeded to right lumpectomy and sentinel lymph node sampling 03/26/2016. This showed (SZA 17-03/12/2003) and invasive ductal carcinoma, grade 2, measuring 2.3 cm. Margins were close but negative. All 4 sentinel lymph nodes were clear.  An Oncotype DX study was obtained from this material and it showed a score of 11, predicting a 10 year risk of recurrence outside the breast of 8% of the patient's only systemic therapy is tamoxifen for 5 years. It also predicts no benefit from chemotherapy  The patient then proceeded directly to radiation, which was completed 06/10/2016. She has continued on tamoxifen right through her treatments. She is tolerating it well, with no significant problems with hot flashes or vaginal wetness.  REVIEW OF SYSTEMS: Tiffany Browning did well with her radiation, with some fatigue and skin changes as expected, but overall she has maintained an excellent functional status. She does have hot flashes, which she describes as mild. She is trying to get back into some kind of exercise program, but this is sketchy at this point. A detailed review of systems today was  otherwise stable.     [name] PAST MEDICAL HISTORY: Past Medical History:  Diagnosis Date  . Allergy   . Asthma   . Breast cancer ([place])   . Depression   . Diabetes mellitus    Type 1  . Family  history of breast cancer   . Family history of kidney cancer   . Heart murmur   . Hypothyroidism   . Thyroid disease    Hypothyroidism    PAST SURGICAL HISTORY: Past Surgical History:  Procedure Laterality Date  . APPENDECTOMY    . BREAST LUMPECTOMY WITH AXILLARY LYMPH NODE BIOPSY Right 03/26/2016   Procedure: RIGHT BREAST LUMPECTOMY WITH AXILLARY LYMPH NODE BIOPSY;  Surgeon: Autumn Messing III, MD;  Location: [place] [place];  Service: General;  Laterality: Right;  . NASAL SINUS SURGERY    . ruptured ovarian cyst    . SHOULDER ARTHROSCOPY  2004   b/L --second one 2011  . TONSILECTOMY, ADENOIDECTOMY, BILATERAL MYRINGOTOMY AND TUBES    . TUBAL LIGATION     then reversed it    FAMILY HISTORY Family History  Problem Relation Age of Onset  . Cancer Mother     cervical  . Hypertension Mother   . Hyperlipidemia Mother   . Heart disease Father 79    MI---- cabg before that  . Hypothyroidism Sister   . Breast cancer Sister 1  . Heart disease Brother 78    MI--- second one 27  . Hyperlipidemia Brother   . Hypertension Brother   . Hypothyroidism Sister   . Kidney cancer Other 27    son of her sister with breast cancer  The patient's father died from heart disease in his 23s in the setting of tobacco abuse. The patient's mother died from pneumonia at the age of 36 also in the setting of tobacco abuse. The patient had one brother, 2 sisters. One of the sisters, Tiffany Browning, was diagnosed with breast cancer at the age of 81. The patient's mother may have had a history of cervical cancer. There is no history of ovarian cancer in the family.   GYNECOLOGIC HISTORY:  No LMP recorded. Patient is postmenopausal.  menarche 79. The patient is GX P0. Tiffany Browning underwent total abdominal hysterectomy with bilateral salpingo-oophorectomy in 1990 for what was thought at that time might be ovarian cancer but proved to be a benign cyst. She has been on estrogen replacement until 01/16/2016 .  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 She is taking courses in advanced codeine at [employer] in is going to be "graduating" late July. Her husband Tiffany Browning (SAMAR VENNEMAN) works for [employer]. ItIs just the 2 of them plus their dog Jasmine at home.     ADVANCED DIRECTIVES:  in place: Both have a living well.    HEALTH MAINTENANCE: Social History  Substance Use Topics  . Smoking status: Never Smoker  . Smokeless tobacco: Never Used  . Alcohol use Yes     Comment: occ     Colonoscopy:2007/ [facility]  PAP: s/p hysterectomy  Bone density:  Lipid panel:  Allergies  Allergen Reactions  . Clearsil [Benzoyl Peroxide] Rash    Current Outpatient Prescriptions  Medication Sig Dispense Refill  . albuterol (PROVENTIL HFA;VENTOLIN HFA) 108 (90 Base) MCG/ACT inhaler Inhale 2 puffs into the lungs.    . docusate sodium (COLACE) 100 MG capsule Take 100 mg by mouth daily.    . fish oil-omega-3 fatty acids 1000  MG capsule Take 1 g by mouth daily.      [name] gabapentin (NEURONTIN) 300 MG capsule Take 1 capsule (300 mg total) by mouth at bedtime. 90 capsule 0  . insulin aspart (NOVOLOG) 100 UNIT/ML injection Per Dr Forde Dandy (Patient taking differently: Inject 0.3-0.4 Units into the skin continuous. Insulin pump-Per Dr Forde Dandy) 3 vial PRN  . levothyroxine (SYNTHROID, LEVOTHROID) 175 MCG tablet Take 175 mcg by mouth daily. Take all days except on Sunday    . montelukast (SINGULAIR) 10 MG tablet Take 10 mg by mouth at bedtime.      . Multiple Vitamin (MULTIVITAMIN) tablet Take 1 tablet by mouth daily.      . tamoxifen (NOLVADEX) 20 MG tablet Take 20 mg by mouth daily.    [name] venlafaxine XR (EFFEXOR-XR) 37.5 MG 24 hr capsule TAKE 2 CAPSULES (75 MG TOTAL) BY MOUTH DAILY WITH BREAKFAST. 180 capsule 0   No current facility-administered medications for this visit.     OBJECTIVE: Middle-aged white woman In no acute distress Vitals:   06/17/16 1353  BP: (!) 130/55   Pulse: 73  Resp: 18  Temp: 98.1 F (36.7 C)     Body mass index is 23.99 kg/m.    ECOG FS:1 - Symptomatic but completely ambulatory  Sclerae unicteric, pupils round and equal Oropharynx clear and moist-- no thrush or other lesions No cervical or supraclavicular adenopathy Lungs no rales or rhonchi Heart regular rate and rhythm Abd soft, nontender, positive bowel sounds MSK no focal spinal tenderness, no upper extremity lymphedema Neuro: nonfocal, well oriented, appropriate affect Breasts: The right breast is status post lumpectomy and recently completed radiation. There is minimal erythema, no desquamation. In general the cosmetic result is good but there is some alteration of the breast profile. The right axilla is benign. Left breast is unremarkable.    LAB RESULTS:  CMP     Component Value Date/Time   NA 139 03/18/2016 0944   NA 139 01/28/2016 1547   K 4.9 03/18/2016 0944   K 4.3 01/28/2016 1547   CL 106 03/18/2016 0944   CO2 26 03/18/2016 0944   CO2 29 01/28/2016 1547   GLUCOSE 179 (H) 03/18/2016 0944   GLUCOSE 129 01/28/2016 1547   BUN 11 03/18/2016 0944   BUN 17.6 01/28/2016 1547   CREATININE 0.71 03/18/2016 0944   CREATININE 1.0 01/28/2016 1547   CALCIUM 9.0 03/18/2016 0944   CALCIUM 9.4 01/28/2016 1547   PROT 7.6 01/28/2016 1547   ALBUMIN 3.9 01/28/2016 1547   AST 35 (H) 01/28/2016 1547   ALT 38 01/28/2016 1547   ALKPHOS 101 01/28/2016 1547   BILITOT 0.31 01/28/2016 1547   GFRNONAA >60 03/18/2016 0944   GFRAA >60 03/18/2016 0944    INo results found for: SPEP, UPEP  Lab Results  Component Value Date   WBC 4.4 03/18/2016   NEUTROABS 2.3 01/28/2016   HGB 11.5 (L) 03/18/2016   HCT 36.2 03/18/2016   MCV 94.0 03/18/2016   PLT 328 03/18/2016      Chemistry      Component Value Date/Time   NA 139 03/18/2016 0944   NA 139 01/28/2016 1547   K 4.9 03/18/2016 0944   K 4.3 01/28/2016 1547   CL 106 03/18/2016 0944   CO2 26 03/18/2016 0944   CO2 29  01/28/2016 1547   BUN 11 03/18/2016 0944   BUN 17.6 01/28/2016 1547   CREATININE 0.71 03/18/2016 0944   CREATININE 1.0 01/28/2016 1547  Component Value Date/Time   CALCIUM 9.0 03/18/2016 0944   CALCIUM 9.4 01/28/2016 1547   ALKPHOS 101 01/28/2016 1547   AST 35 (H) 01/28/2016 1547   ALT 38 01/28/2016 1547   BILITOT 0.31 01/28/2016 1547       No results found for: LABCA2  No components found for: LABCA125  No results for input(s): INR in the last 168 hours.  Urinalysis    Component Value Date/Time   COLORURINE yellow 02/06/2010 1109   APPEARANCEUR Clear 02/06/2010 1109   LABSPEC 1.015 02/06/2010 1109   PHURINE 6.5 02/06/2010 1109   HGBUR negative 02/06/2010 1109   BILIRUBINUR negative 03/07/2016 1150   PROTEINUR negative 03/07/2016 1150   UROBILINOGEN negative 03/07/2016 1150   UROBILINOGEN 0.2 02/06/2010 1109   NITRITE negative 03/07/2016 1150   NITRITE negative 02/06/2010 1109   LEUKOCYTESUR moderate (2+) (A) 03/07/2016 1150     STUDIES: No results found.  ELIGIBLE FOR AVAILABLE RESEARCH PROTOCOL: PALLAS  ASSESSMENT: 60 y.o. [place], [place] woman status post right breast upper outer quadrant biopsy 01/09/2016 for a clinical T2 N0, stage IIA invasive ductal carcinoma, grade 2, estrogen and progesterone receptor positive, HER-2 nonamplified, with an MIB-1 of 30%.   (a) biopsy of a second upper outer quadrant area of concern 02/14/2016 was benign  (1) neoadjuvant tamoxifen started 01/28/2016  (2) right lumpectomy and sentinel lymph node sampling 03/26/2016 showed a pT2 pN0, stage IIA invasive ductal carcinoma, grade 2, with negative margins  (3) Oncotype DX score of 11 predicts a 10 year risk of recurrence outside the breast of 8% if the patient's only systemic therapy is tamoxifen for 5 years. It also predicted no benefit from chemotherapy  (4) adjuvant radiation completed 06/08/2016  (5) adjuvant anti-estrogens Continued through a local treatment--never  interrupted   (6). Genetics testing of the patient's nephew (kidney cancer at age 84) has been recommended  PLAN: Kassadie has completed the local treatment for her breast cancer. She is tolerating the tamoxifen well and the plan will be to continue that most likely for a total of 5 years.  After 3 years on tamoxifen she will have the option of switching to an aromatase inhibitor for the final 2 years. We will obtain a bone density prior to that point to help with that decision.  I think she would benefit from a referral to physical therapy to discuss lymphedema management. This is more of a preventive setting but I think it will be useful for her.  She does have a slight asymmetry between the 2 breasts. It would be helpful if she met with plastics and discussed the possibility of some fat injections to bring the right breast in 2 or more in line with the contralateral breast. I have gone ahead and placed that referral as well.  Otherwise she is followed closely by Dr. Thayer Ohm and she also sees her gynecologist once a year in April and going to see her next May, but she knows to call for any problems that may develop before that visit.  Chauncey Cruel, MD   06/18/2016 7:07 AM Medical Oncology and Hematology [facility] [address] [place], [place] [address] Tel. [phone]    Fax. [phone]

## 2016-06-30 ENCOUNTER — Other Ambulatory Visit: Payer: Self-pay | Admitting: *Deleted

## 2016-06-30 DIAGNOSIS — Z923 Personal history of irradiation: Secondary | ICD-10-CM | POA: Insufficient documentation

## 2016-06-30 MED ORDER — GABAPENTIN 300 MG PO CAPS: 300.0000 mg | ORAL_CAPSULE | Freq: Every day | ORAL | 0 refills | Status: DC

## 2016-07-01 ENCOUNTER — Ambulatory Visit: Payer: 59 | Attending: Oncology | Admitting: Physical Therapy

## 2016-07-01 ENCOUNTER — Ambulatory Visit: Payer: 59 | Admitting: Physical Therapy

## 2016-07-01 DIAGNOSIS — L599 Disorder of the skin and subcutaneous tissue related to radiation, unspecified: Secondary | ICD-10-CM | POA: Diagnosis not present

## 2016-07-01 DIAGNOSIS — M25611 Stiffness of right shoulder, not elsewhere classified: Secondary | ICD-10-CM | POA: Insufficient documentation

## 2016-07-01 DIAGNOSIS — Z483 Aftercare following surgery for neoplasm: Secondary | ICD-10-CM | POA: Diagnosis present

## 2016-07-01 NOTE — Therapy (Signed)
[place], [place], [address] Phone: [phone]   Fax:  [phone]  Physical Therapy Evaluation  Patient Details  Name: Tiffany Browning MRN: [id number] Date of Birth: [birth year] Referring Provider: Dr. Jana Hakim   Encounter Date: 07/01/2016      PT End of Session - 07/01/16 1251    Visit Number 1   Number of Visits 9   Date for PT Re-Evaluation 08/07/16   PT Start Time 0930   PT Stop Time 1015   PT Time Calculation (min) 45 min   Activity Tolerance Patient tolerated treatment well   Behavior During Therapy [facility] for tasks assessed/performed      Past Medical History:  Diagnosis Date  . Allergy   . Asthma   . Breast cancer ([place])   . Depression   . Diabetes mellitus    Type 1  . Family history of breast cancer   . Family history of kidney cancer   . Heart murmur   . Hypothyroidism   . Thyroid disease    Hypothyroidism    Past Surgical History:  Procedure Laterality Date  . APPENDECTOMY    . BREAST LUMPECTOMY WITH AXILLARY LYMPH NODE BIOPSY Right 03/26/2016   Procedure: RIGHT BREAST LUMPECTOMY WITH AXILLARY LYMPH NODE BIOPSY;  Surgeon: Autumn Messing III, MD;  Location: [facility];  Service: General;  Laterality: Right;  . NASAL SINUS SURGERY    . ruptured ovarian cyst    . SHOULDER ARTHROSCOPY  2004   b/L --second one 2011  . TONSILECTOMY, ADENOIDECTOMY, BILATERAL MYRINGOTOMY AND TUBES    . TUBAL LIGATION     then reversed it    There were no vitals filed for this visit.       Subjective Assessment - 07/01/16 0942    Subjective "i'm having a little swelling under the arm" Pt also reports ongoing fatigue    Pertinent History Type I diabetes, on insulin pump.  Right lumpectomy on 03/26/2016 with 3 or 4 lymph nodes removed. followed by radaition treatment that endedn on 10/25 2017.  Pt did have skin breakdown and has delayed healing due to diabetes    Patient Stated Goals to get rid of the  swelling under the arm    Currently in Pain? No/denies            [facility] PT Assessment - 07/01/16 0001      Assessment   Medical Diagnosis right breast cancer    Referring Provider Dr. Jana Hakim    Onset Date/Surgical Date 03/26/16   Hand Dominance Right     Precautions   Precautions Other (comment)   Precaution Comments previous cancer treatment  insulin pump      Restrictions   Weight Bearing Restrictions No     Balance Screen   Has the patient fallen in the past 6 months No   Has the patient had a decrease in activity level because of a fear of falling?  No   Is the patient reluctant to leave their home because of a fear of falling?  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  She also reports same type of skin lesions on left knee area.  Skin around right breast is discolored from radiation.  visible pulling of myofascial in right axilla with right shoulder abduction    Skin Integrity small healing area at right breast lateral incision    Quick DASH  11.36  Lymphedema LIfe Impact Scale 14 or 21% impaired      Sensation   Light Touch Not tested     Coordination   Gross Motor Movements are Fluid and Coordinated Yes     Posture/Postural Control   Posture/Postural Control No significant limitations     AROM   Right Shoulder Flexion 138 Degrees   Right Shoulder ABduction 125 Degrees   Right Shoulder  Internal Rotation 54 Degrees   Right Shoulder External Rotation 77 Degrees   Left Shoulder Flexion 145 Degrees   Left Shoulder ABduction 163 Degrees     Strength   Right Hand Grip (lbs) 50,50,50   Left Hand Grip (lbs) 50,45,50     Palpation   Palpation comment firmness in right inferior lateral right breast            LYMPHEDEMA/ONCOLOGY QUESTIONNAIRE - 07/01/16 1002      Right Upper Extremity Lymphedema   10 cm Proximal to Olecranon Process 31 cm   Olecranon Process 25 cm   10 cm Proximal to Ulnar Styloid Process 22.5 cm   Just Proximal to Ulnar Styloid Process 15 cm   Across Hand at [employer] 18.5 cm   At [place] of 2nd Digit 6 cm     Left Upper Extremity Lymphedema   10 cm Proximal to Olecranon Process 30 cm   Olecranon Process 25 cm   10 cm Proximal to Ulnar Styloid Process 22 cm   Just Proximal to Ulnar Styloid Process 15 cm   Across Hand at [employer] 18 cm   At [employer] of 2nd Digit 6 cm           Quick Dash - 07/01/16 0001    Open a tight or new jar No difficulty   Do heavy household chores (wash walls, wash floors) No difficulty   Carry a shopping bag or briefcase No difficulty   Wash your back No difficulty   Use a knife to cut food No difficulty   Recreational activities in which you take some force or impact through your arm, shoulder, or hand (golf, hammering, tennis) No difficulty   During the past week, to what extent has your arm, shoulder or hand problem interfered with your normal social activities with family, friends, neighbors, or groups? Not at all   During the past week, to what extent has your arm, shoulder or hand problem limited your work or other regular daily activities Not at all   Arm, shoulder, or hand pain. Moderate   Tingling (pins and needles) in your arm, shoulder, or hand Moderate   Difficulty Sleeping Mild difficulty   DASH Score 11.36 %             [facility] Adult PT Treatment/Exercise - 07/01/16 0001      Self-Care    Self-Care Other Self-Care Comments   Other Self-Care Comments  supine dowel rod stretching for flexion and abduction  PT Education - 07/01/16 1249    Education provided Yes   Education Details supine dowel rod stretching for shoulder flexion and abduction    Person(s) Educated Patient   Methods Explanation;Demonstration   Comprehension Verbalized understanding;Returned demonstration                [facility] Goals - 07/01/16 1259      CC Long Term Goal  #1   Title Pt will verbalized lymphedema risk reduction practices   Time 4   Period Weeks   Status New     CC Long Term Goal  #2   Title Pt will be independent in self manual lymph drainage for right  breast    Time 4   Period Weeks   Status New     CC Long Term Goal  #3   Title Pt will be independent in home exercise program    Time 4   Period Weeks   Status New     CC Long Term Goal  #4   Title Pt will have 150 degrees of right shoulder abduction to make reaching and dressing easier    Baseline 125 on 07/01/2016   Time 4   Period Weeks   Status New            Plan - 07/01/16 1251    Clinical Impression Statement 60 yo female seen in our PT clinic for treatment after breast cancer lumpectomy with radiation She has breast lymphedema with firmness, fascial restrictions in axilla resulting in decreased range of right shoulder motion. She also has skin lesions from unknown cause and was referred to call her Dr. due to setting on Type 1 DM This evaluation is of moderate complexity due to multiple complexity and ongoing nature due to changes from radiation .    Rehab Potential Excellent   Clinical Impairments Affecting Rehab Potential previous radiation. prolonged healing due to Type I DM and insulin pump    PT Frequency 2x / week   PT Duration 4 weeks   PT Treatment/Interventions ADLs/Self Care Home Management;Patient/family education;DME Instruction;Taping;Manual  techniques;Therapeutic activities;Therapeutic exercise;Manual lymph drainage;Compression bandaging;Neuromuscular re-education;Passive range of motion;Scar mobilization   PT Next Visit Plan manual lymph draiange to right breast, precaution for newly radiated skin, A/AA/PROM to right shoulder with HEP instruction, later lymphedema risk reduction practice education ( pt works on Mondays) later, Medical illustrator program    Consulted and Agree with Plan of Care Patient      Patient will benefit from skilled therapeutic intervention in order to improve the following deficits and impairments:  Decreased skin integrity, Increased fascial restricitons, Decreased knowledge of use of DME, Decreased range of motion, Impaired flexibility, Decreased scar mobility, Decreased knowledge of precautions, Increased edema  Visit Diagnosis: Disorder of the skin and subcutaneous tissue related to radiation, unspecified  Stiffness of right shoulder, not elsewhere classified  Aftercare following surgery for neoplasm     Problem List Patient Active Problem List   Diagnosis Date Noted  . Family history of breast cancer   . Family history of kidney cancer   . Breast cancer of upper-outer quadrant of right female breast ([place]) 01/28/2016  . Sinusitis, acute maxillary 07/11/2014  . Wheezing 07/11/2014  . VAGINITIS, CANDIDAL 02/06/2010  . VAGINITIS, BACTERIAL 02/06/2010  . DYSURIA 09/20/2007  . HYPOTHYROIDISM 02/21/2007  . DIABETES MELLITUS, TYPE I 02/21/2007  . EXTERNAL OTITIS 02/21/2007  . ALLERGIC RHINITIS 02/21/2007  . ASTHMA 02/21/2007   Donato Heinz. Owens Shark PT  Owens Shark,  [place] 07/01/2016, 1:05 PM  [place], [place], [address] Phone: [phone]   Fax:  [phone]  Name: Tiffany Browning MRN: [id number] Date of Birth: [birth year]

## 2016-07-07 ENCOUNTER — Ambulatory Visit: Payer: 59 | Admitting: Physical Therapy

## 2016-07-07 DIAGNOSIS — L599 Disorder of the skin and subcutaneous tissue related to radiation, unspecified: Secondary | ICD-10-CM

## 2016-07-07 NOTE — Therapy (Signed)
[place], [place], [address] Phone: [phone]   Fax:  [phone]  Physical Therapy Treatment  Patient Details  Name: Tiffany Browning MRN: [id number] Date of Birth: 1955/08/27 Referring Provider: Dr. Jana Hakim   Encounter Date: 07/07/2016    Past Medical History:  Diagnosis Date  . Allergy   . Asthma   . Breast cancer ([place])   . Depression   . Diabetes mellitus    Type 1  . Family history of breast cancer   . Family history of kidney cancer   . Heart murmur   . Hypothyroidism   . Thyroid disease    Hypothyroidism    Past Surgical History:  Procedure Laterality Date  . APPENDECTOMY    . BREAST LUMPECTOMY WITH AXILLARY LYMPH NODE BIOPSY Right 03/26/2016   Procedure: RIGHT BREAST LUMPECTOMY WITH AXILLARY LYMPH NODE BIOPSY;  Surgeon: Autumn Messing III, MD;  Location: [facility];  Service: General;  Laterality: Right;  . NASAL SINUS SURGERY    . ruptured ovarian cyst    . SHOULDER ARTHROSCOPY  2004   b/L --second one 2011  . TONSILECTOMY, ADENOIDECTOMY, BILATERAL MYRINGOTOMY AND TUBES    . TUBAL LIGATION     then reversed it    There were no vitals filed for this visit.      Subjective Assessment - 07/07/16 1304    Subjective 'I''m alright"  A llittle sore around the area that is still healing.  She is still putting neosporin on it.  She has been doing exercise and feels that there is some improvement    Pertinent History Type I diabetes, on insulin pump.  Right lumpectomy on 03/26/2016 with 3 or 4 lymph nodes removed. followed by radaition treatment that endedn on 10/25 2017.  Pt did have skin breakdown and has delayed healing due to diabetes    Patient Stated Goals to get rid of the swelling under the arm    Currently in Pain? No/denies            [facility] PT Assessment - 07/07/16 0001      AROM   Right Shoulder ABduction 150 Degrees                     [facility] Adult PT  Treatment/Exercise - 07/07/16 0001      Self-Care   Other Self-Care Comments  lymphedema risk reduction,  pt verbalized understanding      Shoulder Exercises: Supine   Horizontal ABduction Strengthening;Both;5 reps;Theraband   Theraband Level (Shoulder Horizontal ABduction) Level 1 (Yellow)   External Rotation Strengthening;Both;5 reps;Theraband   Theraband Level (Shoulder External Rotation) Level 1 (Yellow)   Flexion Strengthening;Both;5 reps  narrow and wide grip    Theraband Level (Shoulder Flexion) Level 1 (Yellow)   Other Supine Exercises diagonal elevation with yellow theraband 5 reps weith each arm    Other Supine Exercises pt encouraged to continue dowel exercise      Manual Therapy   Manual Therapy Manual Lymphatic Drainage (MLD)   Manual Lymphatic Drainage (MLD) Instructed in and performed with occasional use of mirror for visual feelback, short neck, superficial an deep abdominals, avoiding left inguinal area due to insulin pump, right inguinal nodes with partial axillo-inguinal anastamosis, avoiding left breast and healing area, right axillary area with anterior interaxillary anastamosis, pt was was able to perform repeat demonstration.                 PT Education -  07/07/16 1722    Education provided Yes   Education Details supine scapular series and progression, lymphedema risk reduction practices    Person(s) Educated Patient   Methods Explanation;Demonstration;Handout   Comprehension Verbalized understanding;Returned demonstration                [facility] Goals - 07/07/16 1724      CC Long Term Goal  #1   Title Pt will verbalized lymphedema risk reduction practices   Status Achieved     CC Long Term Goal  #2   Title Pt will be independent in self manual lymph drainage for right  breast    Status Achieved     CC Long Term Goal  #3   Title Pt will be independent in home exercise program    Status Achieved     CC Long Term Goal  #4    Title Pt will have 150 degrees of right shoulder abduction to make reaching and dressing easier    Baseline 125 on 07/01/2016. 150 on 07/07/2016   Status Achieved            Plan - 07/07/16 1723    Clinical Impression Statement Pt feels that she knows what she needs to do to continue her care at home and does not need to return to PT.  She has already seen imporvement in the swelling under her arm which is the reason she wanted to come here. She is pleased with her progress    Rehab Potential Excellent   Clinical Impairments Affecting Rehab Potential previous radiation. prolonged healing due to Type I DM and insulin pump    PT Treatment/Interventions ADLs/Self Care Home Management;Patient/family education;DME Instruction;Taping;Manual techniques;Therapeutic activities;Therapeutic exercise;Manual lymph drainage;Compression bandaging;Neuromuscular re-education;Passive range of motion;Scar mobilization   PT Next Visit Plan discarge this episode    Consulted and Agree with Plan of Care Patient      Patient will benefit from skilled therapeutic intervention in order to improve the following deficits and impairments:  Decreased skin integrity, Increased fascial restricitons, Decreased knowledge of use of DME, Decreased range of motion, Impaired flexibility, Decreased scar mobility, Decreased knowledge of precautions, Increased edema  Visit Diagnosis: Disorder of the skin and subcutaneous tissue related to radiation, unspecified     Problem List Patient Active Problem List   Diagnosis Date Noted  . Family history of breast cancer   . Family history of kidney cancer   . Breast cancer of upper-outer quadrant of right female breast ([place]) 01/28/2016  . Sinusitis, acute maxillary 07/11/2014  . Wheezing 07/11/2014  . VAGINITIS, CANDIDAL 02/06/2010  . VAGINITIS, BACTERIAL 02/06/2010  . DYSURIA 09/20/2007  . HYPOTHYROIDISM 02/21/2007  . DIABETES MELLITUS, TYPE I 02/21/2007  . EXTERNAL  OTITIS 02/21/2007  . ALLERGIC RHINITIS 02/21/2007  . ASTHMA 02/21/2007   PHYSICAL THERAPY DISCHARGE SUMMARY  Visits from Start of Care: 2  Current functional level related to goals / functional outcomes: Pt feels that she can continue her care on her own at home    Remaining deficits: Decreased right shoulder range of motion and strength, delayed healing of wound   Education / Equipment: Home exercise, self manual lymph draiange  Plan: Patient agrees to discharge.  Patient goals were met. Patient is being discharged due to meeting the stated rehab goals.  ?????    Donato Heinz. Owens Shark PT  [place]o 07/07/2016, 5:26 PM  [place], [place], [address] Phone: [phone]  Fax:  [phone]  Name: Tiffany Browning MRN: [id number] Date of Birth: 15-May-1956

## 2016-07-07 NOTE — Patient Instructions (Signed)
  Over Head Pull: Narrow Grip       On back, knees bent, feet flat, band across thighs, elbows straight but relaxed. Pull hands apart (start). Keeping elbows straight, bring arms up and over head, hands toward floor. Keep pull steady on band. Hold momentarily. Return slowly, keeping pull steady, back to start. Repeat _5-10__ times. Band color yellow ______   Side Pull: Double Arm   On back, knees bent, feet flat. Arms perpendicular to body, shoulder level, elbows straight but relaxed. Pull arms out to sides, elbows straight. Resistance band comes across collarbones, hands toward floor. Hold momentarily. Slowly return to starting position. Repeat 5-10___ times. Band color __yellow__   Sash   On back, knees bent, feet flat, left hand on left hip, right hand above left. Pull right arm DIAGONALLY (hip to shoulder) across chest. Bring right arm along head toward floor. Hold momentarily. Slowly return to starting position. Repeat _5-10__ times. Do with left arm. Band color ___yellow ___   Shoulder Rotation: Double Arm   On back, knees bent, feet flat, elbows tucked at sides, bent 90, hands palms up. Pull hands apart and down toward floor, keeping elbows near sides. Hold momentarily. Slowly return to starting position. Repeat _5-10__ times. Band color __yellow____

## 2016-07-14 ENCOUNTER — Encounter: Payer: 59 | Admitting: Physical Therapy

## 2016-07-15 ENCOUNTER — Encounter: Payer: Self-pay | Admitting: Radiation Oncology

## 2016-07-15 ENCOUNTER — Ambulatory Visit
Admission: RE | Admit: 2016-07-15 | Discharge: 2016-07-15 | Disposition: A | Discharge status: Home / Self Care | Payer: 59 | Source: Ambulatory Visit | Attending: Radiation Oncology | Admitting: Radiation Oncology

## 2016-07-15 DIAGNOSIS — Z923 Personal history of irradiation: Secondary | ICD-10-CM | POA: Insufficient documentation

## 2016-07-15 DIAGNOSIS — Z794 Long term (current) use of insulin: Secondary | ICD-10-CM | POA: Diagnosis not present

## 2016-07-15 DIAGNOSIS — C50411 Malignant neoplasm of upper-outer quadrant of right female breast: Secondary | ICD-10-CM | POA: Diagnosis present

## 2016-07-15 DIAGNOSIS — Z170 Estrogen receptor positive status [ER+]: Secondary | ICD-10-CM | POA: Diagnosis present

## 2016-07-15 NOTE — Progress Notes (Signed)
Tiffany Browning is here for follow up of radiation completed 06/10/16 to her Right Breast. She denies pain or fatigue. She is taking Tamoxifen which she started on 01/28/16. Her skin has improved, though she continues to have redness to her Lumpectomy site. She continues to put neosporin to this area. She also continues to use Radiaplex to her Right Breast. She is aware she can use Vitamin E cream when her tube of Radiaplex is completed.   BP (!) 152/79   Pulse (!) 59   Temp 98.5 F (36.9 C)   Ht 5' 1.25" (1.556 m)   Wt 128 lb 9.6 oz (58.3 kg)   SpO2 100% Comment: room air  BMI 24.10 kg/m    Wt Readings from Last 3 Encounters:  07/15/16 128 lb 9.6 oz (58.3 kg)  06/17/16 128 lb (58.1 kg)  06/08/16 128 lb (58.1 kg)

## 2016-07-15 NOTE — Progress Notes (Signed)
Radiation Oncology         [phone] ________________________________  Name: Tiffany Browning MRN: [place]:[id number]  Date: 07/15/2016  DOB: Apr 05, 1956  Follow-Up Visit Note  Outpatient  CC: [name], DO  Autumn Messing III, MD  Diagnosis and Prior Radiotherapy:    ICD-9-CM ICD-10-CM   1. Malignant neoplasm of upper-outer quadrant of right breast in female, estrogen receptor positive ([place]) 174.4 C50.411    V86.0 Z17.0     CHIEF COMPLAINT: Here for follow-up and surveillance of right breast cancer  Narrative:  The patient returns today for routine follow-up of radiation completed 06/10/16 to her right breast.  She denies pain or fatigue. The patient reports she is taking Tamoxifen which she started on 01/28/16. She reports her skin has improved, though she continues to have redness to her lumpectomy site; she continues to apply neosporin to the area. She also continues to use Radiaplex on her right breast.                   ALLERGIES:  is allergic to clearsil [benzoyl peroxide].  Meds: Current Outpatient Prescriptions  Medication Sig Dispense Refill  . albuterol (PROVENTIL HFA;VENTOLIN HFA) 108 (90 Base) MCG/ACT inhaler Inhale 2 puffs into the lungs.    . beclomethasone (QVAR) 80 MCG/ACT inhaler Inhale into the lungs.    . docusate sodium (COLACE) 100 MG capsule Take 100 mg by mouth daily.    . fish oil-omega-3 fatty acids 1000 MG capsule Take 1 g by mouth daily.      [name] gabapentin (NEURONTIN) 300 MG capsule Take 1 capsule (300 mg total) by mouth at bedtime. 90 capsule 0  . insulin aspart (NOVOLOG) 100 UNIT/ML injection Per Dr Forde Dandy (Patient taking differently: Inject 0.3-0.4 Units into the skin continuous. Insulin pump-Per Dr Forde Dandy) 3 vial PRN  . levothyroxine (SYNTHROID, LEVOTHROID) 175 MCG tablet Take 175 mcg by mouth daily. Take all days except on Sunday    . montelukast (SINGULAIR) 10 MG tablet Take 10 mg by mouth at bedtime.      . Multiple Vitamin (MULTIVITAMIN) tablet  Take 1 tablet by mouth daily.      . tamoxifen (NOLVADEX) 20 MG tablet Take 20 mg by mouth daily.    [name] venlafaxine XR (EFFEXOR-XR) 37.5 MG 24 hr capsule TAKE 2 CAPSULES (75 MG TOTAL) BY MOUTH DAILY WITH BREAKFAST. 180 capsule 0  . PROAIR RESPICLICK [id number] (90 Base) MCG/ACT AEPB      No current facility-administered medications for this encounter.     Physical Findings: The patient is in no acute distress. Patient is alert and oriented.  height is 5' 1.25" (1.556 m) and weight is 128 lb 9.6 oz (58.3 kg). Her temperature is 98.5 F (36.9 C). Her blood pressure is 152/79 (abnormal) and her pulse is 59 (abnormal). Her oxygen saturation is 100%.   General: Alert and oriented, in no acute distress Psychiatric: Judgment and insight are intact. Affect is appropriate. Breast: Residual scab in the central aspect of the rt lumpectomy scar with a little bit of surrounding erythema and firmness, no fluctuance or warmth. Overall skin has healed well throughout right breast. Upper-inner quadrant of the breast, just under the clavicle, the patient has a small superficial scab where she scratched herself.  Lab Findings: Lab Results  Component Value Date   WBC 4.4 03/18/2016   HGB 11.5 (L) 03/18/2016   HCT 36.2 03/18/2016   MCV 94.0 03/18/2016   PLT 328 03/18/2016    Radiographic  Findings: No results found.  Impression/Plan:  This is a well appearing 60 year old female with no evidence of disease recurrence. She is healing well from her treatment.  I recommend the patient transition to Vitamin E lotion when Radiaplex runs out, and use it for the next several months as the skin in the treatment area continues to heal.  The patient will follow up with me as needed, and knows she can contact me at any time with questions or concerns.  _____________________________________   [name], MD  This document serves as a record of services personally performed by [name], MD. It was created on her  behalf by Maryla Morrow, a trained medical scribe. The creation of this record is based on the scribe's personal observations and the provider's statements to them. This document has been checked and approved by the attending provider.

## 2016-07-21 ENCOUNTER — Encounter: Payer: 59 | Admitting: Physical Therapy

## 2016-07-28 ENCOUNTER — Encounter: Payer: 59 | Admitting: Physical Therapy

## 2016-07-29 ENCOUNTER — Encounter: Payer: 59 | Admitting: Physical Therapy

## 2016-07-31 NOTE — Progress Notes (Signed)
  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 Malignant neoplasm of upper-outer quadrant of right breast in female, estrogen receptor positive ([place]) 174.4 C50.411    V86.0 Z17.0     Indication for treatment:  Curative       Radiation treatment dates:   05/14/2016 to 06/10/2016  Site/dose:    1. The Right breast was treated to 40.05 Gy in 15 fractions at 2.67 Gy per fraction.  2. The Right breast was boosted to 10 Gy in 5 fractions at 2 Gy per fraction.  Beams/energy:    1. 3D // 6X 2. En face // 12 MeV  Narrative: The patient tolerated radiation treatment relatively well.   The patient developed faint erythema over the right breast but overall stable appearance. The patient also developed scabbing / erythema over part of right lumpectomy scar with no discharge or fluctuance.   Plan: The patient has completed radiation treatment. The patient will return to radiation oncology clinic for routine followup in one month. I advised them to call or return sooner if they have any questions or concerns related to their recovery or treatment.  -----------------------------------  [name], MD   This document serves as a record of services personally performed by [name], MD. It was created on her behalf by Arlyce Harman, a trained medical scribe. The creation of this record is based on the scribe's personal observations and the provider's statements to them. This document has been checked and approved by the attending provider.

## 2016-08-04 ENCOUNTER — Encounter: Payer: 59 | Admitting: Physical Therapy

## 2016-08-07 DIAGNOSIS — L508 Other urticaria: Secondary | ICD-10-CM | POA: Insufficient documentation

## 2016-08-26 DIAGNOSIS — E1040 Type 1 diabetes mellitus with diabetic neuropathy, unspecified: Secondary | ICD-10-CM | POA: Diagnosis not present

## 2016-08-26 DIAGNOSIS — Z1389 Encounter for screening for other disorder: Secondary | ICD-10-CM | POA: Diagnosis not present

## 2016-08-26 DIAGNOSIS — E1142 Type 2 diabetes mellitus with diabetic polyneuropathy: Secondary | ICD-10-CM | POA: Diagnosis not present

## 2016-08-26 DIAGNOSIS — C50411 Malignant neoplasm of upper-outer quadrant of right female breast: Secondary | ICD-10-CM | POA: Diagnosis not present

## 2016-09-28 ENCOUNTER — Other Ambulatory Visit: Payer: Self-pay | Admitting: *Deleted

## 2016-09-28 MED ORDER — GABAPENTIN 300 MG PO CAPS: 300.0000 mg | ORAL_CAPSULE | Freq: Every day | ORAL | 0 refills | Status: DC

## 2016-10-08 DIAGNOSIS — E1065 Type 1 diabetes mellitus with hyperglycemia: Secondary | ICD-10-CM | POA: Diagnosis not present

## 2016-10-29 ENCOUNTER — Encounter: Payer: Self-pay | Admitting: Family Medicine

## 2016-10-29 ENCOUNTER — Ambulatory Visit (INDEPENDENT_AMBULATORY_CARE_PROVIDER_SITE_OTHER): Payer: 59 | Admitting: Family Medicine

## 2016-10-29 VITALS — BP 136/58 | HR 113 | Temp 98.3°F | Resp 18 | Ht 61.3 in | Wt 129.4 lb

## 2016-10-29 DIAGNOSIS — R062 Wheezing: Secondary | ICD-10-CM

## 2016-10-29 DIAGNOSIS — J40 Bronchitis, not specified as acute or chronic: Secondary | ICD-10-CM | POA: Diagnosis not present

## 2016-10-29 MED ORDER — ALBUTEROL SULFATE (2.5 MG/3ML) 0.083% IN NEBU
2.5000 mg | INHALATION_SOLUTION | Freq: Once | RESPIRATORY_TRACT | Status: AC
  Administered 2016-10-29: 2.5 mg via RESPIRATORY_TRACT

## 2016-10-29 MED ORDER — PREDNISONE 10 MG PO TABS: ORAL_TABLET | ORAL | 0 refills | Status: DC

## 2016-10-29 MED ORDER — ALBUTEROL SULFATE (2.5 MG/3ML) 0.083% IN NEBU: 2.5000 mg | INHALATION_SOLUTION | Freq: Four times a day (QID) | RESPIRATORY_TRACT | 1 refills | Status: DC | PRN

## 2016-10-29 MED ORDER — AMOXICILLIN-POT CLAVULANATE 875-125 MG PO TABS: 1.0000 | ORAL_TABLET | Freq: Two times a day (BID) | ORAL | 0 refills | Status: DC

## 2016-10-29 MED ORDER — METHYLPREDNISOLONE ACETATE 80 MG/ML IJ SUSP
80.0000 mg | Freq: Once | INTRAMUSCULAR | Status: AC
  Administered 2016-10-29: 80 mg via INTRAMUSCULAR

## 2016-10-29 NOTE — Progress Notes (Signed)
Pre visit review using our clinic review tool, if applicable. No additional management support is needed unless otherwise documented below in the visit note. 

## 2016-10-29 NOTE — Progress Notes (Signed)
Patient ID: Tiffany Browning, female   DOB: [birth year], 61 y.o.   MRN: [id number]     Subjective:  I acted as a Education administrator for Dr. Carollee Herter.  Guerry [place]in, [place]   Patient ID: Tiffany Browning, female    DOB: [birth year], 61 y.o.   MRN: [id number]  Chief Complaint  Patient presents with  . Cough    started yesterday, [employer] chest expectorant.  . Wheezing  . Headache  . Ear Fullness    both ears  . Scratchy throat    Cough  This is a new problem. Associated symptoms include ear congestion, headaches, nasal congestion and wheezing. Associated symptoms comments: Scratchy throat. Treatments tried: expectorant. The treatment provided no relief.  Wheezing   Associated symptoms include coughing and headaches.  Headache   Associated symptoms include coughing.  Ear Fullness   Associated symptoms include coughing and headaches.    Patient is in today for cough, wheezing, headache, both ears feel full, and scratchy throat.  Patient Care Team: [name], DO as PCP - General (Family Medicine) Reynold Bowen, MD as Consulting Physician (Endocrinology) Autumn Messing III, MD as Consulting Physician (General Surgery) Chauncey Cruel, MD as Consulting Physician (Oncology) [name], MD as Attending Physician (Radiation Oncology)   Past Medical History:  Diagnosis Date  . Allergy   . Asthma   . Breast cancer ([place])   . Diabetes mellitus    Type 1  . Family history of breast cancer   . Family history of kidney cancer   . Heart murmur   . Hypothyroidism   . Thyroid disease    Hypothyroidism    Past Surgical History:  Procedure Laterality Date  . APPENDECTOMY    . BREAST LUMPECTOMY WITH AXILLARY LYMPH NODE BIOPSY Right 03/26/2016   Procedure: RIGHT BREAST LUMPECTOMY WITH AXILLARY LYMPH NODE BIOPSY;  Surgeon: Autumn Messing III, MD;  Location: [facility];  Service: General;  Laterality: Right;  . NASAL SINUS SURGERY    . ruptured ovarian cyst    . SHOULDER ARTHROSCOPY  2004   b/L --second one  2011  . TONSILECTOMY, ADENOIDECTOMY, BILATERAL MYRINGOTOMY AND TUBES    . TUBAL LIGATION     then reversed it    Family History  Problem Relation Age of Onset  . Cancer Mother     cervical  . Hypertension Mother   . Hyperlipidemia Mother   . Heart disease Father 74    MI---- cabg before that  . Hypothyroidism Sister   . Breast cancer Sister 20  . Heart disease Brother 76    MI--- second one 71  . Hyperlipidemia Brother   . Hypertension Brother   . Hypothyroidism Sister   . Kidney cancer Other 60    son of her sister with breast cancer    Social History   Social History  . Marital status: Married    Spouse name: [name]  . Number of children: 0  . Years of education: N/A   Occupational History  . 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 Smoking status: Never Smoker  . Smokeless tobacco: Never Used  . Alcohol use Yes     Comment: occ  . Drug use: No  . Sexual activity: Yes    Partners: Male   Other Topics Concern  . Not on file   Social History Narrative  . No narrative on file    Outpatient Medications Prior to Visit  Medication Sig Dispense Refill  .  beclomethasone (QVAR) 80 MCG/ACT inhaler Inhale into the lungs.    . docusate sodium (COLACE) 100 MG capsule Take 100 mg by mouth daily.    . fish oil-omega-3 fatty acids 1000 MG capsule Take 1 g by mouth daily.      [name] gabapentin (NEURONTIN) 300 MG capsule Take 1 capsule (300 mg total) by mouth at bedtime. 90 capsule 0  . insulin aspart (NOVOLOG) 100 UNIT/ML injection Per Dr Forde Dandy (Patient taking differently: Inject 0.3-0.4 Units into the skin continuous. Insulin pump-Per Dr Forde Dandy) 3 vial PRN  . levothyroxine (SYNTHROID, LEVOTHROID) 175 MCG tablet Take 175 mcg by mouth daily. Take all days except on Sunday    . montelukast (SINGULAIR) 10 MG tablet Take 10 mg by mouth at bedtime.      . Multiple Vitamin (MULTIVITAMIN) tablet Take 1 tablet by mouth daily.      [name] PROAIR RESPICLICK 237  (90 Base) MCG/ACT AEPB     . tamoxifen (NOLVADEX) 20 MG tablet Take 20 mg by mouth daily.    [name] venlafaxine XR (EFFEXOR-XR) 37.5 MG 24 hr capsule TAKE 2 CAPSULES (75 MG TOTAL) BY MOUTH DAILY WITH BREAKFAST. 180 capsule 0  . albuterol (PROVENTIL HFA;VENTOLIN HFA) 108 (90 Base) MCG/ACT inhaler Inhale 2 puffs into the lungs.     No facility-administered medications prior to visit.     Allergies  Allergen Reactions  . Clearsil [Benzoyl Peroxide] Rash    Review of Systems  Respiratory: Positive for cough and wheezing.   Neurological: Positive for headaches.       Objective:    Physical Exam  Constitutional: She is oriented to person, place, and time. She appears well-developed and well-nourished. No distress.  HENT:  Head: Normocephalic and atraumatic.  Eyes: Conjunctivae are normal.  Neck: Normal range of motion. No thyromegaly present.  Cardiovascular: Normal rate and regular rhythm.   Murmur heard. Pulmonary/Chest: Effort normal. She has wheezes.  Abdominal: Soft. Bowel sounds are normal. There is no tenderness.  Musculoskeletal: Normal range of motion. She exhibits no edema or deformity.  Neurological: She is alert and oriented to person, place, and time.  Skin: Skin is warm and dry. She is not diaphoretic.  Psychiatric: She has a normal mood and affect.    BP (!) 136/58 (BP Location: Left Arm, Cuff Size: Normal)   Pulse (!) 113   Temp 98.3 F (36.8 C) (Oral)   Resp 18   Ht 5' 1.3" (1.557 m)   Wt 129 lb 6.4 oz (58.7 kg)   SpO2 90%   BMI 24.21 kg/m  Wt Readings from Last 3 Encounters:  10/29/16 129 lb 6.4 oz (58.7 kg)  07/15/16 128 lb 9.6 oz (58.3 kg)  06/17/16 128 lb (58.1 kg)      Immunization History  Administered Date(s) Administered  . Influenza,inj,Quad PF,36+ Mos 04/24/2015  . Influenza-Unspecified 05/23/2013  . Tdap 11/21/2013    Health Maintenance  Topic Date Due  . Hepatitis C Screening  1955/09/10  . PNEUMOCOCCAL POLYSACCHARIDE VACCINE (1)  05/30/1959  . FOOT EXAM  05/30/1967  . OPHTHALMOLOGY EXAM  05/30/1967  . HIV Screening  05/29/1972  . INFLUENZA VACCINE  03/17/2016  . URINE MICROALBUMIN  06/10/2016  . HEMOGLOBIN A1C  09/18/2016  . MAMMOGRAM  02/13/2018  . COLONOSCOPY  02/24/2018  . PAP SMEAR  12/03/2018  . TETANUS/TDAP  11/22/2023    Lab Results  Component Value Date   WBC 4.4 03/18/2016   HGB 11.5 (L) 03/18/2016   HCT 36.2 03/18/2016  PLT 328 03/18/2016   GLUCOSE 179 (H) 03/18/2016   ALT 38 01/28/2016   AST 35 (H) 01/28/2016   NA 139 03/18/2016   K 4.9 03/18/2016   CL 106 03/18/2016   CREATININE 0.71 03/18/2016   BUN 11 03/18/2016   CO2 26 03/18/2016   HGBA1C 6.7 (H) 03/18/2016   MICROALBUR 12 06/11/2015    No results found for: TSH Lab Results  Component Value Date   WBC 4.4 03/18/2016   HGB 11.5 (L) 03/18/2016   HCT 36.2 03/18/2016   MCV 94.0 03/18/2016   PLT 328 03/18/2016   Lab Results  Component Value Date   NA 139 03/18/2016   K 4.9 03/18/2016   CHLORIDE 104 01/28/2016   CO2 26 03/18/2016   GLUCOSE 179 (H) 03/18/2016   BUN 11 03/18/2016   CREATININE 0.71 03/18/2016   BILITOT 0.31 01/28/2016   ALKPHOS 101 01/28/2016   AST 35 (H) 01/28/2016   ALT 38 01/28/2016   PROT 7.6 01/28/2016   ALBUMIN 3.9 01/28/2016   CALCIUM 9.0 03/18/2016   ANIONGAP 7 03/18/2016   EGFR 63 (L) 01/28/2016   No results found for: CHOL No results found for: HDL No results found for: LDLCALC No results found for: TRIG No results found for: CHOLHDL Lab Results  Component Value Date   HGBA1C 6.7 (H) 03/18/2016         Assessment & Plan:   Problem List Items Addressed This Visit      Unprioritized   Wheezing - Primary   Relevant Medications   albuterol (PROVENTIL) (2.5 MG/3ML) 0.083% nebulizer solution 2.5 mg (Completed)   methylPREDNISolone acetate (DEPO-MEDROL) injection 80 mg (Completed)   Bronchitis    abx per orders Inh per orders pred taper rto prn          I have discontinued  Ms. Crutchley albuterol. I am also having her start on albuterol, predniSONE, and amoxicillin-clavulanate. Additionally, I am having her maintain her levothyroxine, multivitamin, montelukast, fish oil-omega-3 fatty acids, insulin aspart, tamoxifen, docusate sodium, venlafaxine XR, beclomethasone, PROAIR RESPICLICK, gabapentin, and ONE TOUCH ULTRA TEST. We administered albuterol and methylPREDNISolone acetate.  Meds ordered this encounter  Medications  . ONE TOUCH ULTRA TEST test strip    Sig: TEST 8 TIMES DAILY OR AS DIRECTED    Refill:  2  . albuterol (PROVENTIL) (2.5 MG/3ML) 0.083% nebulizer solution 2.5 mg  . albuterol (PROVENTIL) (2.5 MG/3ML) 0.083% nebulizer solution    Sig: Take 3 mLs (2.5 mg total) by nebulization every 6 (six) hours as needed for wheezing or shortness of breath.    Dispense:  150 mL    Refill:  1  . predniSONE (DELTASONE) 10 MG tablet    Sig: Takes 3 tabs daily for 3 days, 2 tabs daily for 3 days, 1 tablet for 3 days, then stop    Dispense:  18 tablet    Refill:  0  . methylPREDNISolone acetate (DEPO-MEDROL) injection 80 mg  . amoxicillin-clavulanate (AUGMENTIN) 875-125 MG tablet    Sig: Take 1 tablet by mouth 2 (two) times daily.    Dispense:  20 tablet    Refill:  0    CMA served as scribe during this visit. History, Physical and Plan performed by medical provider. Documentation and orders reviewed and attested to.  [name], DO

## 2016-10-29 NOTE — Patient Instructions (Addendum)
Sliding scale:  200-250 - 2 units 250-300 -4 units 300-350 - 6 units 350-400 - 8 units 400-500 -10 units   Asthma, Adult Asthma is a recurring condition in which the airways tighten and narrow. Asthma can make it difficult to breathe. It can cause coughing, wheezing, and shortness of breath. Asthma episodes, also called asthma attacks, range from minor to life-threatening. Asthma cannot be cured, but medicines and lifestyle changes can help control it. What are the causes? Asthma is believed to be caused by inherited (genetic) and environmental factors, but its exact cause is unknown. Asthma may be triggered by allergens, lung infections, or irritants in the air. Asthma triggers are different for each person. Common triggers include:  Animal dander.  Dust mites.  Cockroaches.  Pollen from trees or grass.  Mold.  Smoke.  Air pollutants such as dust, household cleaners, hair sprays, aerosol sprays, paint fumes, strong chemicals, or strong odors.  Cold air, weather changes, and winds (which increase molds and pollens in the air).  Strong emotional expressions such as crying or laughing hard.  Stress.  Certain medicines (such as aspirin) or types of drugs (such as beta-blockers).  Sulfites in foods and drinks. Foods and drinks that may contain sulfites include dried fruit, potato chips, and sparkling grape juice.  Infections or inflammatory conditions such as the flu, a cold, or an inflammation of the nasal membranes (rhinitis).  Gastroesophageal reflux disease (GERD).  Exercise or strenuous activity. What are the signs or symptoms? Symptoms may occur immediately after asthma is triggered or many hours later. Symptoms include:  Wheezing.  Excessive nighttime or early morning coughing.  Frequent or severe coughing with a common cold.  Chest tightness.  Shortness of breath. How is this diagnosed? The diagnosis of asthma is made by a review of your medical history and  a physical exam. Tests may also be performed. These may include:  Lung function studies. These tests show how much air you breathe in and out.  Allergy tests.  Imaging tests such as X-rays. How is this treated? Asthma cannot be cured, but it can usually be controlled. Treatment involves identifying and avoiding your asthma triggers. It also involves medicines. There are 2 classes of medicine used for asthma treatment:  Controller medicines. These prevent asthma symptoms from occurring. They are usually taken every day.  Reliever or rescue medicines. These quickly relieve asthma symptoms. They are used as needed and provide short-term relief. Your health care provider will help you create an asthma action plan. An asthma action plan is a written plan for managing and treating your asthma attacks. It includes a list of your asthma triggers and how they may be avoided. It also includes information on when medicines should be taken and when their dosage should be changed. An action plan may also involve the use of a device called a peak flow meter. A peak flow meter measures how well the lungs are working. It helps you monitor your condition. Follow these instructions at home:  Take medicines only as directed by your health care provider. Speak with your health care provider if you have questions about how or when to take the medicines.  Use a peak flow meter as directed by your health care provider. Record and keep track of readings.  Understand and use the action plan to help minimize or stop an asthma attack without needing to seek medical care.  Control your home environment in the following ways to help prevent asthma attacks:  Do  not smoke. Avoid being exposed to secondhand smoke.  Change your heating and air conditioning filter regularly.  Limit your use of fireplaces and wood stoves.  Get rid of pests (such as roaches and mice) and their droppings.  Throw away plants if you see  mold on them.  Clean your floors and dust regularly. Use unscented cleaning products.  Try to have someone else vacuum for you regularly. Stay out of rooms while they are being vacuumed and for a short while afterward. If you vacuum, use a dust mask from a hardware store, a double-layered or microfilter vacuum cleaner bag, or a vacuum cleaner with a HEPA filter.  Replace carpet with wood, tile, or vinyl flooring. Carpet can trap dander and dust.  Use allergy-proof pillows, mattress covers, and box spring covers.  Wash bed sheets and blankets every week in hot water and dry them in a dryer.  Use blankets that are made of polyester or cotton.  Clean bathrooms and kitchens with bleach. If possible, have someone repaint the walls in these rooms with mold-resistant paint. Keep out of the rooms that are being cleaned and painted.  Wash hands frequently. Contact a health care provider if:  You have wheezing, shortness of breath, or a cough even if taking medicine to prevent attacks.  The colored mucus you cough up (sputum) is thicker than usual.  Your sputum changes from clear or white to yellow, green, gray, or bloody.  You have any problems that may be related to the medicines you are taking (such as a rash, itching, swelling, or trouble breathing).  You are using a reliever medicine more than 2-3 times per week.  Your peak flow is still at 50-79% of your personal best after following your action plan for 1 hour.  You have a fever. Get help right away if:  You seem to be getting worse and are unresponsive to treatment during an asthma attack.  You are short of breath even at rest.  You get short of breath when doing very little physical activity.  You have difficulty eating, drinking, or talking due to asthma symptoms.  You develop chest pain.  You develop a fast heartbeat.  You have a bluish color to your lips or fingernails.  You are light-headed, dizzy, or faint.  Your  peak flow is less than 50% of your personal best. This information is not intended to replace advice given to you by your health care provider. Make sure you discuss any questions you have with your health care provider. Document Released: 08/03/2005 Document Revised: 01/15/2016 Document Reviewed: 03/02/2013 Elsevier Interactive Patient Education  2017 [employer].

## 2016-10-31 DIAGNOSIS — J40 Bronchitis, not specified as acute or chronic: Secondary | ICD-10-CM | POA: Insufficient documentation

## 2016-10-31 NOTE — Assessment & Plan Note (Signed)
abx per orders Inh per orders pred taper rto prn

## 2016-11-05 ENCOUNTER — Telehealth: Payer: Self-pay | Admitting: Oncology

## 2016-11-05 NOTE — Telephone Encounter (Signed)
lvm to inform pt of SCP appt in July per LOS

## 2016-11-23 ENCOUNTER — Other Ambulatory Visit: Payer: Self-pay | Admitting: *Deleted

## 2016-11-23 MED ORDER — VENLAFAXINE HCL ER 37.5 MG PO CP24: 75.0000 mg | ORAL_CAPSULE | Freq: Every day | ORAL | 0 refills | Status: DC

## 2016-11-24 DIAGNOSIS — E1040 Type 1 diabetes mellitus with diabetic neuropathy, unspecified: Secondary | ICD-10-CM | POA: Diagnosis not present

## 2016-11-24 DIAGNOSIS — Z6823 Body mass index (BMI) 23.0-23.9, adult: Secondary | ICD-10-CM | POA: Diagnosis not present

## 2016-12-01 DIAGNOSIS — E1040 Type 1 diabetes mellitus with diabetic neuropathy, unspecified: Secondary | ICD-10-CM | POA: Diagnosis not present

## 2016-12-08 DIAGNOSIS — Z01419 Encounter for gynecological examination (general) (routine) without abnormal findings: Secondary | ICD-10-CM | POA: Diagnosis not present

## 2016-12-15 ENCOUNTER — Other Ambulatory Visit: Payer: Self-pay | Admitting: *Deleted

## 2016-12-15 ENCOUNTER — Other Ambulatory Visit (HOSPITAL_BASED_OUTPATIENT_CLINIC_OR_DEPARTMENT_OTHER): Payer: 59

## 2016-12-15 ENCOUNTER — Ambulatory Visit (HOSPITAL_BASED_OUTPATIENT_CLINIC_OR_DEPARTMENT_OTHER): Payer: 59 | Admitting: Oncology

## 2016-12-15 VITALS — BP 137/48 | HR 64 | Temp 98.0°F | Resp 20 | Wt 128.6 lb

## 2016-12-15 DIAGNOSIS — Z79810 Long term (current) use of selective estrogen receptor modulators (SERMs): Secondary | ICD-10-CM | POA: Diagnosis not present

## 2016-12-15 DIAGNOSIS — Z170 Estrogen receptor positive status [ER+]: Secondary | ICD-10-CM

## 2016-12-15 DIAGNOSIS — C50411 Malignant neoplasm of upper-outer quadrant of right female breast: Secondary | ICD-10-CM

## 2016-12-15 LAB — CBC WITH DIFFERENTIAL/PLATELET
BASO%: 0.1 % (ref 0.0–2.0)
Basophils Absolute: 0 10*3/uL (ref 0.0–0.1)
EOS%: 4.5 % (ref 0.0–7.0)
Eosinophils Absolute: 0.2 10*3/uL (ref 0.0–0.5)
HCT: 34.5 % — ABNORMAL LOW (ref 34.8–46.6)
HGB: 11.4 g/dL — ABNORMAL LOW (ref 11.6–15.9)
LYMPH%: 28.6 % (ref 14.0–49.7)
MCH: 30.6 pg (ref 25.1–34.0)
MCHC: 33.1 g/dL (ref 31.5–36.0)
MCV: 92.4 fL (ref 79.5–101.0)
MONO#: 0.4 10*3/uL (ref 0.1–0.9)
MONO%: 11.6 % (ref 0.0–14.0)
NEUT#: 2 10*3/uL (ref 1.5–6.5)
NEUT%: 55.2 % (ref 38.4–76.8)
Platelets: 278 10*3/uL (ref 145–400)
RBC: 3.73 10*6/uL (ref 3.70–5.45)
RDW: 12.9 % (ref 11.2–14.5)
WBC: 3.6 10*3/uL — ABNORMAL LOW (ref 3.9–10.3)
lymph#: 1 10*3/uL (ref 0.9–3.3)

## 2016-12-15 LAB — COMPREHENSIVE METABOLIC PANEL
ALT: 30 U/L (ref 0–55)
AST: 31 U/L (ref 5–34)
Albumin: 3.7 g/dL (ref 3.5–5.0)
Alkaline Phosphatase: 109 U/L (ref 40–150)
Anion Gap: 6 mEq/L (ref 3–11)
BUN: 14.6 mg/dL (ref 7.0–26.0)
CO2: 28 mEq/L (ref 22–29)
Calcium: 9 mg/dL (ref 8.4–10.4)
Chloride: 106 mEq/L (ref 98–109)
Creatinine: 0.8 mg/dL (ref 0.6–1.1)
EGFR: 82 mL/min/{1.73_m2} — ABNORMAL LOW (ref >90)
Glucose: 133 mg/dl (ref 70–140)
Potassium: 4.8 mEq/L (ref 3.5–5.1)
Sodium: 140 mEq/L (ref 136–145)
Total Bilirubin: <0.22 mg/dL (ref 0.20–1.20)
Total Protein: 7.2 g/dL (ref 6.4–8.3)

## 2016-12-15 MED ORDER — TAMOXIFEN CITRATE 20 MG PO TABS: 20.0000 mg | ORAL_TABLET | Freq: Every day | ORAL | 4 refills | Status: DC

## 2016-12-15 MED ORDER — VENLAFAXINE HCL ER 37.5 MG PO CP24: 75.0000 mg | ORAL_CAPSULE | Freq: Every day | ORAL | 0 refills | Status: DC

## 2016-12-15 MED ORDER — GABAPENTIN 300 MG PO CAPS: 300.0000 mg | ORAL_CAPSULE | Freq: Every day | ORAL | 0 refills | Status: DC

## 2016-12-15 NOTE — Progress Notes (Signed)
Both one was supposed to be a nice day the morning was fine.  [place]  [phone] [phone]     ID: Tiffany Browning DOB: [birth year]  MR#: [id number]  IAX#:[id number]  Patient Care Team: [name], DO as PCP - General (Family Medicine) Reynold Bowen, MD as Consulting Physician (Endocrinology) Autumn Messing III, MD as Consulting Physician (General Surgery) Chauncey Cruel, MD as Consulting Physician (Oncology) [name], MD as Attending Physician (Radiation Oncology) PCP: [name], DO GYN: OTHER MD:  CHIEF COMPLAINT: Estrogen receptor positive breast cancer  CURRENT TREATMENT:  tamoxifen   BREAST CANCER HISTORY: From the original intake note:  [name] had routine physical exam by her gynecologist in May 2017 and she was able to palpate a mass in the upper-outer quadrant of her right breast. The patient had had bilateral screening mammography 06/26/2015 at the [facility], with no suspicious findings. On 12/31/2015 she underwent bilateral diagnostic mammography with tomography and right breast ultrasonography. The breast density was category D. On focal spot compression in the area of palpable concern there was a possible mass, hard to make out because of the breast density. On exam there was a firm palpable lump measuring approximately 3 cm at the 9:00 region of the right breast, 4 cm from the nipple. The overlying skin was unremarkable. Ultrasonography confirmed an irregular hypoechoic mass in the area in question measuring 3.0 cm.  Also, separate from the palpable mass at the 9:00 position but 2 cm from the nipple was a hypoechoic oval mass measuring 2.0 cm. Ultrasound of the right axilla was negative.  Biopsy of both the masses in question from the right rest were obtained 01/09/2016. This showed (SAA [id number]) the mass closer to the nipple, S2 centimeters, to be a fibroadenoma. The more distant mass, 4 cm from the nipple,  was invasive ductal carcinoma, E-cadherin positive, grade 2, estrogen receptor 100% positive, progesterone receptor 100% positive, with an MIB-1 of 30%, and no HER-2 amplification, the signals ratio being 1.43 and the number per cell 2.15.  The patient's subsequent history is as detailed below  INTERVAL HISTORY: Tiffany Browning returns today for follow-up of her estrogen receptor positive breast cancer. She continues on tamoxifen, generally with good tolerance. She does have hot flashes. These rarely wake her up. She does not have vaginal wetness problems. She is taking gabapentin at bedtime and venlafaxine during the day which is helping. She is obtaining the drug currently at no cost  She will be graduating from her medical administration courses in December 2018.  REVIEW OF SYSTEMS: Tiffany Browning does assume 3 times a week. She likes to walk but does not count her steps. She tells me her most recent A1c was 6.7. Aside from these issues a detailed review of systems today was stable     . PAST MEDICAL HISTORY: Past Medical History:  Diagnosis Date  . Allergy   . Asthma   . Breast cancer ([place])   . Diabetes mellitus    Type 1  . Family history of breast cancer   . Family history of kidney cancer   . Heart murmur   . Hypothyroidism   . Thyroid disease    Hypothyroidism    PAST SURGICAL HISTORY: Past Surgical History:  Procedure Laterality Date  . APPENDECTOMY    . BREAST LUMPECTOMY WITH AXILLARY LYMPH NODE BIOPSY Right 03/26/2016   Procedure: RIGHT BREAST LUMPECTOMY WITH AXILLARY LYMPH NODE BIOPSY;  Surgeon: Autumn Messing III, MD;  Location: [facility] OR;  Service: General;  Laterality: Right;  . NASAL SINUS SURGERY    . ruptured ovarian cyst    . SHOULDER ARTHROSCOPY  2004   b/L --second one 2011  . TONSILECTOMY, ADENOIDECTOMY, BILATERAL MYRINGOTOMY AND TUBES    . TUBAL LIGATION     then reversed it    FAMILY HISTORY Family History  Problem Relation Age of Onset  . Cancer Mother     cervical  .  Hypertension Mother   . Hyperlipidemia Mother   . Heart disease Father 66    MI---- cabg before that  . Hypothyroidism Sister   . Breast cancer Sister 35  . Heart disease Brother 58    MI--- second one 87  . Hyperlipidemia Brother   . Hypertension Brother   . Hypothyroidism Sister   . Kidney cancer Other 1    son of her sister with breast cancer  The patient's father died from heart disease in his 45s in the setting of tobacco abuse. The patient's mother died from pneumonia at the age of 63 also in the setting of tobacco abuse. The patient had one brother, 2 sisters. One of the sisters, Tiffany Browning, was diagnosed with breast cancer at the age of 28. The patient's mother may have had a history of cervical cancer. There is no history of ovarian cancer in the family.   GYNECOLOGIC HISTORY:  No LMP recorded. Patient is postmenopausal.  menarche 61. The patient is GX P0. Tiffany Browning underwent total abdominal hysterectomy with bilateral salpingo-oophorectomy in 1990 for what was thought at that time might be ovarian cancer but proved to be a benign cyst. She has been on estrogen replacement until 01/16/2016 .  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 She is taking courses in advanced codeine at [employer] in is going to be "graduating" late July. Her husband Tiffany Browning (BINDU DOCTER) works for [employer]. ItIs just the 2 of them plus their dog Tiffany Browning at home.     ADVANCED DIRECTIVES:  in place: Both have a living well.    HEALTH MAINTENANCE: Social History  Substance Use Topics  . Smoking status: Never Smoker  . Smokeless tobacco: Never Used  . Alcohol use Yes     Comment: occ     Colonoscopy:2007/ [facility]  PAP: s/p hysterectomy  Bone density:  Lipid panel:  Allergies  Allergen Reactions  . Clearsil [Benzoyl Peroxide] Rash    Current Outpatient Prescriptions  Medication Sig Dispense Refill  . albuterol (PROVENTIL) (2.5  MG/3ML) 0.083% nebulizer solution Take 3 mLs (2.5 mg total) by nebulization every 6 (six) hours as needed for wheezing or shortness of breath. 150 mL 1  . amoxicillin-clavulanate (AUGMENTIN) 875-125 MG tablet Take 1 tablet by mouth 2 (two) times daily. 20 tablet 0  . beclomethasone (QVAR) 80 MCG/ACT inhaler Inhale into the lungs.    . docusate sodium (COLACE) 100 MG capsule Take 100 mg by mouth daily.    . fish oil-omega-3 fatty acids 1000 MG capsule Take 1 g by mouth daily.      [name] gabapentin (NEURONTIN) 300 MG capsule Take 1 capsule (300 mg total) by mouth at bedtime. 90 capsule 0  . insulin aspart (NOVOLOG) 100 UNIT/ML injection Per Dr Forde Dandy (Patient taking differently: Inject 0.3-0.4 Units into the skin continuous. Insulin pump-Per Dr Forde Dandy) 3 vial PRN  . levothyroxine (SYNTHROID, LEVOTHROID) 175 MCG tablet Take 175 mcg by mouth daily. Take all days except on  Sunday    . montelukast (SINGULAIR) 10 MG tablet Take 10 mg by mouth at bedtime.      . Multiple Vitamin (MULTIVITAMIN) tablet Take 1 tablet by mouth daily.      . ONE TOUCH ULTRA TEST test strip TEST 8 TIMES DAILY OR AS DIRECTED  2  . predniSONE (DELTASONE) 10 MG tablet Takes 3 tabs daily for 3 days, 2 tabs daily for 3 days, 1 tablet for 3 days, then stop 18 tablet 0  . PROAIR RESPICLICK 707 (90 Base) MCG/ACT AEPB     . tamoxifen (NOLVADEX) 20 MG tablet Take 20 mg by mouth daily.    [name] venlafaxine XR (EFFEXOR-XR) 37.5 MG 24 hr capsule Take 2 capsules (75 mg total) by mouth daily with breakfast. 180 capsule 0   No current facility-administered medications for this visit.     OBJECTIVE: Middle-aged white woman Who appears stated age 32:   12/15/16 1310  BP: (!) 137/48  Pulse: 64  Resp: 20  Temp: 98 F (36.7 C)     Body mass index is 24.06 kg/m.    ECOG FS:0 - Asymptomatic  Sclerae unicteric, EOMs intact Oropharynx clear and moist No cervical or supraclavicular adenopathy Lungs no rales or rhonchi Heart regular rate and  rhythm Abd soft, nontender, positive bowel sounds MSK no focal spinal tenderness, no upper extremity lymphedema Neuro: nonfocal, well oriented, appropriate affect Breasts: The right breast as undergone lumpectomy and radiation, with no evidence of local recurrence. The cosmetic result is good. The left breast is unremarkable. Both axillae are benign.  LAB RESULTS:  CMP     Component Value Date/Time   NA 139 03/18/2016 0944   NA 139 01/28/2016 1547   K 4.9 03/18/2016 0944   K 4.3 01/28/2016 1547   CL 106 03/18/2016 0944   CO2 26 03/18/2016 0944   CO2 29 01/28/2016 1547   GLUCOSE 179 (H) 03/18/2016 0944   GLUCOSE 129 01/28/2016 1547   BUN 11 03/18/2016 0944   BUN 17.6 01/28/2016 1547   CREATININE 0.71 03/18/2016 0944   CREATININE 1.0 01/28/2016 1547   CALCIUM 9.0 03/18/2016 0944   CALCIUM 9.4 01/28/2016 1547   PROT 7.6 01/28/2016 1547   ALBUMIN 3.9 01/28/2016 1547   AST 35 (H) 01/28/2016 1547   ALT 38 01/28/2016 1547   ALKPHOS 101 01/28/2016 1547   BILITOT 0.31 01/28/2016 1547   GFRNONAA >60 03/18/2016 0944   GFRAA >60 03/18/2016 0944    INo results found for: SPEP, UPEP  Lab Results  Component Value Date   WBC 3.6 (L) 12/15/2016   NEUTROABS 2.0 12/15/2016   HGB 11.4 (L) 12/15/2016   HCT 34.5 (L) 12/15/2016   MCV 92.4 12/15/2016   PLT 278 12/15/2016      Chemistry      Component Value Date/Time   NA 139 03/18/2016 0944   NA 139 01/28/2016 1547   K 4.9 03/18/2016 0944   K 4.3 01/28/2016 1547   CL 106 03/18/2016 0944   CO2 26 03/18/2016 0944   CO2 29 01/28/2016 1547   BUN 11 03/18/2016 0944   BUN 17.6 01/28/2016 1547   CREATININE 0.71 03/18/2016 0944   CREATININE 1.0 01/28/2016 1547      Component Value Date/Time   CALCIUM 9.0 03/18/2016 0944   CALCIUM 9.4 01/28/2016 1547   ALKPHOS 101 01/28/2016 1547   AST 35 (H) 01/28/2016 1547   ALT 38 01/28/2016 1547   BILITOT 0.31 01/28/2016 1547  No results found for: LABCA2  No components found for:  INOMV672  No results for input(s): INR in the last 168 hours.  Urinalysis    Component Value Date/Time   COLORURINE yellow 02/06/2010 1109   APPEARANCEUR Clear 02/06/2010 1109   LABSPEC 1.015 02/06/2010 1109   PHURINE 6.5 02/06/2010 1109   HGBUR negative 02/06/2010 1109   BILIRUBINUR negative 03/07/2016 1150   PROTEINUR negative 03/07/2016 1150   UROBILINOGEN negative 03/07/2016 1150   UROBILINOGEN 0.2 02/06/2010 1109   NITRITE negative 03/07/2016 1150   NITRITE negative 02/06/2010 1109   LEUKOCYTESUR moderate (2+) (A) 03/07/2016 1150     STUDIES: No results found.  ELIGIBLE FOR AVAILABLE RESEARCH PROTOCOL: PALLAS  ASSESSMENT: 61 y.o. [place], [place] woman status post right breast upper outer quadrant biopsy 01/09/2016 for a clinical T2 N0, stage IIA invasive ductal carcinoma, grade 2, estrogen and progesterone receptor positive, HER-2 nonamplified, with an MIB-1 of 30%.   (a) biopsy of a second upper outer quadrant area of concern 02/14/2016 was benign  (1) neoadjuvant tamoxifen started 01/28/2016  (2) right lumpectomy and sentinel lymph node sampling 03/26/2016 showed a pT2 pN0, stage IIA invasive ductal carcinoma, grade 2, with negative margins  (3) Oncotype DX score of 11 predicts a 10 year risk of recurrence outside the breast of 8% if the patient's only systemic therapy is tamoxifen for 5 years. It also predicted no benefit from chemotherapy  (4) adjuvant radiation completed 06/08/2016  (5) tamoxifen continued through local treatment--never interrupted   (6). Genetics testing of the patient's nephew (kidney cancer at age 13) has been recommended  PLAN: Ameira is now almost a year out from definitive surgery for her breast cancer with no evidence of disease recurrence. This is very favorable.  She is tolerating tamoxifen well and the plan will be to continue that for a total of 5 years. At that point she will have a choice of "graduating" or continuing at tamoxifen an  additional 5 years for a rather marginal benefit however.  I have set her up for mammography in June. We will continue to repeat that with tomography yearly.  We usually see patients at this stage every 6 months, but she is doing so well and is so aware of her situation that I am comfortable seeing her in a year. She knows to call for any problems that may develop before that visit.   Chauncey Cruel, MD   12/15/2016 1:30 PM Medical Oncology and Hematology [facility] [address] [place], [place] [address] Tel. [phone]    Fax. [phone]

## 2016-12-20 ENCOUNTER — Telehealth: Payer: Self-pay | Admitting: Family Medicine

## 2016-12-21 ENCOUNTER — Ambulatory Visit (INDEPENDENT_AMBULATORY_CARE_PROVIDER_SITE_OTHER): Payer: 59 | Admitting: Family Medicine

## 2016-12-21 ENCOUNTER — Encounter: Payer: Self-pay | Admitting: Family Medicine

## 2016-12-21 VITALS — BP 118/70 | HR 96 | Temp 98.1°F | Resp 16 | Ht 61.3 in | Wt 125.2 lb

## 2016-12-21 DIAGNOSIS — J40 Bronchitis, not specified as acute or chronic: Secondary | ICD-10-CM | POA: Diagnosis not present

## 2016-12-21 DIAGNOSIS — J324 Chronic pansinusitis: Secondary | ICD-10-CM | POA: Diagnosis not present

## 2016-12-21 MED ORDER — AMOXICILLIN-POT CLAVULANATE 875-125 MG PO TABS: 1.0000 | ORAL_TABLET | Freq: Two times a day (BID) | ORAL | 0 refills | Status: DC

## 2016-12-21 NOTE — Progress Notes (Signed)
Patient ID: EDDYE BROXTERMAN, female   DOB: 1955-11-23, 61 y.o.   MRN: [id number]     Subjective:    Patient ID: [name], female    DOB: 1956-04-23, 61 y.o.   MRN: [id number]  Chief Complaint  Patient presents with  . Cough    Cough  This is a new problem. Episode onset: friday evening. The cough is non-productive (dry cough). Associated symptoms include headaches, nasal congestion, rhinorrhea, shortness of breath and wheezing. Pertinent negatives include no chest pain, fever or rash. Treatments tried: Mucinex cold and flu, Qvar  The treatment provided mild relief.   Patient is in today for cough and congestion.  Past Medical History:  Diagnosis Date  . Allergy   . Asthma   . Breast cancer ([place])   . Diabetes mellitus    Type 1  . Family history of breast cancer   . Family history of kidney cancer   . Heart murmur   . Hypothyroidism   . Thyroid disease    Hypothyroidism    Past Surgical History:  Procedure Laterality Date  . APPENDECTOMY    . BREAST LUMPECTOMY WITH AXILLARY LYMPH NODE BIOPSY Right 03/26/2016   Procedure: RIGHT BREAST LUMPECTOMY WITH AXILLARY LYMPH NODE BIOPSY;  Surgeon: Autumn Messing III, MD;  Location: [facility];  Service: General;  Laterality: Right;  . NASAL SINUS SURGERY    . ruptured ovarian cyst    . SHOULDER ARTHROSCOPY  2004   b/L --second one 2011  . TONSILECTOMY, ADENOIDECTOMY, BILATERAL MYRINGOTOMY AND TUBES    . TUBAL LIGATION     then reversed it    Family History  Problem Relation Age of Onset  . Cancer Mother     cervical  . Hypertension Mother   . Hyperlipidemia Mother   . Heart disease Father 28    MI---- cabg before that  . Hypothyroidism Sister   . Breast cancer Sister 59  . Heart disease Brother 82    MI--- second one 9  . Hyperlipidemia Brother   . Hypertension Brother   . Hypothyroidism Sister   . Kidney cancer Other 24    son of her sister with breast cancer    Social History   Social History  . Marital  status: Married    Spouse name: [name]  . Number of children: 0  . Years of education: N/A   Occupational History  . 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 Smoking status: Never Smoker  . Smokeless tobacco: Never Used  . Alcohol use Yes     Comment: occ  . Drug use: No  . Sexual activity: Yes    Partners: Male   Other Topics Concern  . Not on file   Social History Narrative  . No narrative on file    Outpatient Medications Prior to Visit  Medication Sig Dispense Refill  . albuterol (PROVENTIL) (2.5 MG/3ML) 0.083% nebulizer solution Take 3 mLs (2.5 mg total) by nebulization every 6 (six) hours as needed for wheezing or shortness of breath. 150 mL 1  . beclomethasone (QVAR) 80 MCG/ACT inhaler Inhale into the lungs.    . docusate sodium (COLACE) 100 MG capsule Take 100 mg by mouth daily.    . fish oil-omega-3 fatty acids 1000 MG capsule Take 1 g by mouth daily.      [name] gabapentin (NEURONTIN) 300 MG capsule Take 1 capsule (300 mg total) by mouth at bedtime. 90 capsule 0  .  insulin aspart (NOVOLOG) 100 UNIT/ML injection Per Dr Forde Dandy (Patient taking differently: Inject 0.3-0.4 Units into the skin continuous. Insulin pump-Per Dr Forde Dandy) 3 vial PRN  . levothyroxine (SYNTHROID, LEVOTHROID) 175 MCG tablet Take 175 mcg by mouth daily. Take all days except on Sunday    . montelukast (SINGULAIR) 10 MG tablet Take 10 mg by mouth at bedtime.      . Multiple Vitamin (MULTIVITAMIN) tablet Take 1 tablet by mouth daily.      . ONE TOUCH ULTRA TEST test strip TEST 8 TIMES DAILY OR AS DIRECTED  2  . PROAIR RESPICLICK 828 (90 Base) MCG/ACT AEPB     . tamoxifen (NOLVADEX) 20 MG tablet Take 1 tablet (20 mg total) by mouth daily. 90 tablet 4  . venlafaxine XR (EFFEXOR-XR) 37.5 MG 24 hr capsule Take 2 capsules (75 mg total) by mouth daily with breakfast. 180 capsule 0  . predniSONE (DELTASONE) 10 MG tablet Takes 3 tabs daily for 3 days, 2 tabs daily for 3 days, 1 tablet for 3  days, then stop 18 tablet 0   No facility-administered medications prior to visit.     Allergies  Allergen Reactions  . Clearsil [Benzoyl Peroxide] Rash    Review of Systems  Constitutional: Negative for fever and malaise/fatigue.  HENT: Positive for rhinorrhea. Negative for congestion.   Eyes: Negative for blurred vision.  Respiratory: Positive for cough, shortness of breath and wheezing.   Cardiovascular: Negative for chest pain, palpitations and leg swelling.  Gastrointestinal: Negative for vomiting.  Musculoskeletal: Negative for back pain.  Skin: Negative for rash.  Neurological: Positive for headaches. Negative for loss of consciousness.       Objective:    Physical Exam  Constitutional: She is oriented to person, place, and time. She appears well-developed and well-nourished. No distress.  HENT:  Head: Normocephalic and atraumatic.  Nose: Right sinus exhibits frontal sinus tenderness. Left sinus exhibits maxillary sinus tenderness and frontal sinus tenderness.  Mouth/Throat: Posterior oropharyngeal erythema present.  Eyes: Conjunctivae are normal.  Neck: Normal range of motion. No thyromegaly present.  Cardiovascular: Normal rate and regular rhythm.   Pulmonary/Chest: Effort normal. She has decreased breath sounds. She has wheezes.  Abdominal: Soft. Bowel sounds are normal. There is no tenderness.  Musculoskeletal: Normal range of motion. She exhibits no edema or deformity.  Neurological: She is alert and oriented to person, place, and time.  Skin: Skin is warm and dry. She is not diaphoretic.  Psychiatric: She has a normal mood and affect.  Nursing note and vitals reviewed.   BP 118/70 (BP Location: Left Arm, Cuff Size: Normal)   Pulse 96   Temp 98.1 F (36.7 C) (Oral)   Resp 16   Ht 5' 1.3" (1.557 m)   Wt 125 lb 3.2 oz (56.8 kg)   SpO2 96%   BMI 23.43 kg/m  Wt Readings from Last 3 Encounters:  12/21/16 125 lb 3.2 oz (56.8 kg)  12/15/16 128 lb 9.6 oz  (58.3 kg)  10/29/16 129 lb 6.4 oz (58.7 kg)     Lab Results  Component Value Date   WBC 3.6 (L) 12/15/2016   HGB 11.4 (L) 12/15/2016   HCT 34.5 (L) 12/15/2016   PLT 278 12/15/2016   GLUCOSE 133 12/15/2016   ALT 30 12/15/2016   AST 31 12/15/2016   NA 140 12/15/2016   K 4.8 12/15/2016   CL 106 03/18/2016   CREATININE 0.8 12/15/2016   BUN 14.6 12/15/2016   CO2 28 12/15/2016  HGBA1C 6.7 (H) 03/18/2016   MICROALBUR 12 06/11/2015    No results found for: TSH Lab Results  Component Value Date   WBC 3.6 (L) 12/15/2016   HGB 11.4 (L) 12/15/2016   HCT 34.5 (L) 12/15/2016   MCV 92.4 12/15/2016   PLT 278 12/15/2016   Lab Results  Component Value Date   NA 140 12/15/2016   K 4.8 12/15/2016   CHLORIDE 106 12/15/2016   CO2 28 12/15/2016   GLUCOSE 133 12/15/2016   BUN 14.6 12/15/2016   CREATININE 0.8 12/15/2016   BILITOT <0.22 12/15/2016   ALKPHOS 109 12/15/2016   AST 31 12/15/2016   ALT 30 12/15/2016   PROT 7.2 12/15/2016   ALBUMIN 3.7 12/15/2016   CALCIUM 9.0 12/15/2016   ANIONGAP 6 12/15/2016   EGFR 82 (L) 12/15/2016   No results found for: CHOL No results found for: HDL No results found for: LDLCALC No results found for: TRIG No results found for: CHOLHDL Lab Results  Component Value Date   HGBA1C 6.7 (H) 03/18/2016       Assessment & Plan:   Problem List Items Addressed This Visit      Unprioritized   Bronchitis - Primary    Augmentin Pt still has pred at home if needed She never took it the last time She will use inh and neb prn rto prn       Relevant Medications   amoxicillin-clavulanate (AUGMENTIN) 875-125 MG tablet   Pansinusitis    abx per orders flonase rto prn       Relevant Medications   amoxicillin-clavulanate (AUGMENTIN) 875-125 MG tablet      I have discontinued Ms. Massingale predniSONE. I am also having her start on amoxicillin-clavulanate. Additionally, I am having her maintain her levothyroxine, multivitamin, montelukast,  fish oil-omega-3 fatty acids, insulin aspart, docusate sodium, beclomethasone, PROAIR RESPICLICK, ONE TOUCH ULTRA TEST, albuterol, gabapentin, tamoxifen, and venlafaxine XR.  Meds ordered this encounter  Medications  . amoxicillin-clavulanate (AUGMENTIN) 875-125 MG tablet    Sig: Take 1 tablet by mouth 2 (two) times daily.    Dispense:  20 tablet    Refill:  0     [name], DO

## 2016-12-21 NOTE — Assessment & Plan Note (Signed)
abx per orders flonase  rto prn  

## 2016-12-21 NOTE — Progress Notes (Signed)
Pre visit review using our clinic review tool, if applicable. No additional management support is needed unless otherwise documented below in the visit note. 

## 2016-12-21 NOTE — Patient Instructions (Signed)

## 2016-12-21 NOTE — Assessment & Plan Note (Signed)
Augmentin Pt still has pred at home if needed She never took it the last time She will use inh and neb prn rto prn

## 2017-01-06 DIAGNOSIS — E038 Other specified hypothyroidism: Secondary | ICD-10-CM | POA: Diagnosis not present

## 2017-01-06 DIAGNOSIS — E784 Other hyperlipidemia: Secondary | ICD-10-CM | POA: Diagnosis not present

## 2017-01-07 DIAGNOSIS — E1040 Type 1 diabetes mellitus with diabetic neuropathy, unspecified: Secondary | ICD-10-CM | POA: Diagnosis not present

## 2017-01-07 DIAGNOSIS — Z0000 Encounter for general adult medical examination without abnormal findings: Secondary | ICD-10-CM | POA: Insufficient documentation

## 2017-01-07 DIAGNOSIS — Z4681 Encounter for fitting and adjustment of insulin pump: Secondary | ICD-10-CM | POA: Diagnosis not present

## 2017-01-19 ENCOUNTER — Ambulatory Visit
Admission: RE | Admit: 2017-01-19 | Discharge: 2017-01-19 | Disposition: A | Discharge status: Home / Self Care | Payer: 59 | Source: Ambulatory Visit | Attending: Oncology | Admitting: Oncology

## 2017-01-19 DIAGNOSIS — C50411 Malignant neoplasm of upper-outer quadrant of right female breast: Secondary | ICD-10-CM

## 2017-01-19 DIAGNOSIS — Z170 Estrogen receptor positive status [ER+]: Secondary | ICD-10-CM

## 2017-01-19 DIAGNOSIS — Z853 Personal history of malignant neoplasm of breast: Secondary | ICD-10-CM | POA: Diagnosis not present

## 2017-01-19 HISTORY — DX: Personal history of irradiation: Z92.3

## 2017-01-22 DIAGNOSIS — E1040 Type 1 diabetes mellitus with diabetic neuropathy, unspecified: Secondary | ICD-10-CM | POA: Diagnosis not present

## 2017-02-21 ENCOUNTER — Other Ambulatory Visit: Payer: Self-pay | Admitting: Oncology

## 2017-03-03 DIAGNOSIS — M8588 Other specified disorders of bone density and structure, other site: Secondary | ICD-10-CM | POA: Diagnosis not present

## 2017-03-11 DIAGNOSIS — E1040 Type 1 diabetes mellitus with diabetic neuropathy, unspecified: Secondary | ICD-10-CM | POA: Diagnosis not present

## 2017-03-11 DIAGNOSIS — Z6824 Body mass index (BMI) 24.0-24.9, adult: Secondary | ICD-10-CM | POA: Diagnosis not present

## 2017-03-11 DIAGNOSIS — Z4681 Encounter for fitting and adjustment of insulin pump: Secondary | ICD-10-CM | POA: Diagnosis not present

## 2017-03-12 ENCOUNTER — Encounter: Payer: 59 | Admitting: Adult Health

## 2017-03-14 ENCOUNTER — Other Ambulatory Visit: Payer: Self-pay | Admitting: Oncology

## 2017-03-16 DIAGNOSIS — M8588 Other specified disorders of bone density and structure, other site: Secondary | ICD-10-CM | POA: Diagnosis not present

## 2017-03-19 ENCOUNTER — Encounter: Payer: 59 | Admitting: Adult Health

## 2017-04-21 ENCOUNTER — Other Ambulatory Visit: Payer: Self-pay

## 2017-04-21 MED ORDER — TAMOXIFEN CITRATE 20 MG PO TABS: 20.0000 mg | ORAL_TABLET | Freq: Every day | ORAL | 4 refills | Status: DC

## 2017-04-23 ENCOUNTER — Other Ambulatory Visit: Payer: Self-pay

## 2017-05-04 DIAGNOSIS — C50411 Malignant neoplasm of upper-outer quadrant of right female breast: Secondary | ICD-10-CM | POA: Diagnosis not present

## 2017-05-04 DIAGNOSIS — E1142 Type 2 diabetes mellitus with diabetic polyneuropathy: Secondary | ICD-10-CM | POA: Diagnosis not present

## 2017-05-04 DIAGNOSIS — E1040 Type 1 diabetes mellitus with diabetic neuropathy, unspecified: Secondary | ICD-10-CM | POA: Diagnosis not present

## 2017-05-21 ENCOUNTER — Other Ambulatory Visit: Payer: Self-pay | Admitting: Oncology

## 2017-06-15 DIAGNOSIS — E1065 Type 1 diabetes mellitus with hyperglycemia: Secondary | ICD-10-CM | POA: Diagnosis not present

## 2017-07-02 ENCOUNTER — Other Ambulatory Visit: Payer: Self-pay | Admitting: Oncology

## 2017-09-06 DIAGNOSIS — E1065 Type 1 diabetes mellitus with hyperglycemia: Secondary | ICD-10-CM | POA: Diagnosis not present

## 2017-09-07 DIAGNOSIS — Z23 Encounter for immunization: Secondary | ICD-10-CM | POA: Diagnosis not present

## 2017-09-07 DIAGNOSIS — E1040 Type 1 diabetes mellitus with diabetic neuropathy, unspecified: Secondary | ICD-10-CM | POA: Diagnosis not present

## 2017-09-07 DIAGNOSIS — Z1389 Encounter for screening for other disorder: Secondary | ICD-10-CM | POA: Diagnosis not present

## 2017-10-01 ENCOUNTER — Other Ambulatory Visit: Payer: Self-pay | Admitting: Oncology

## 2017-10-11 ENCOUNTER — Other Ambulatory Visit: Payer: Self-pay | Admitting: Oncology

## 2017-11-16 DIAGNOSIS — J4520 Mild intermittent asthma, uncomplicated: Secondary | ICD-10-CM | POA: Diagnosis not present

## 2017-11-16 DIAGNOSIS — L508 Other urticaria: Secondary | ICD-10-CM | POA: Diagnosis not present

## 2017-11-26 ENCOUNTER — Telehealth: Payer: Self-pay | Admitting: Oncology

## 2017-11-26 NOTE — Telephone Encounter (Signed)
GM CME - moved 5/1 f/u to [facility] and adjusted labs - ok per GM. Spoke with patient she is aware.

## 2017-12-09 DIAGNOSIS — Z01419 Encounter for gynecological examination (general) (routine) without abnormal findings: Secondary | ICD-10-CM | POA: Diagnosis not present

## 2017-12-15 ENCOUNTER — Encounter: Payer: Self-pay | Admitting: Adult Health

## 2017-12-15 ENCOUNTER — Inpatient Hospital Stay: Payer: 59

## 2017-12-15 ENCOUNTER — Inpatient Hospital Stay: Payer: 59 | Attending: Oncology | Admitting: Adult Health

## 2017-12-15 ENCOUNTER — Telehealth: Payer: Self-pay | Admitting: Oncology

## 2017-12-15 VITALS — BP 119/56 | HR 68 | Temp 98.4°F | Resp 16 | Ht 61.3 in | Wt 134.0 lb

## 2017-12-15 DIAGNOSIS — E1065 Type 1 diabetes mellitus with hyperglycemia: Secondary | ICD-10-CM | POA: Diagnosis not present

## 2017-12-15 DIAGNOSIS — N951 Menopausal and female climacteric states: Secondary | ICD-10-CM | POA: Insufficient documentation

## 2017-12-15 DIAGNOSIS — C50411 Malignant neoplasm of upper-outer quadrant of right female breast: Secondary | ICD-10-CM | POA: Diagnosis not present

## 2017-12-15 DIAGNOSIS — E119 Type 2 diabetes mellitus without complications: Secondary | ICD-10-CM | POA: Insufficient documentation

## 2017-12-15 DIAGNOSIS — Z170 Estrogen receptor positive status [ER+]: Secondary | ICD-10-CM

## 2017-12-15 DIAGNOSIS — Z923 Personal history of irradiation: Secondary | ICD-10-CM | POA: Insufficient documentation

## 2017-12-15 DIAGNOSIS — Z79810 Long term (current) use of selective estrogen receptor modulators (SERMs): Secondary | ICD-10-CM | POA: Diagnosis not present

## 2017-12-15 LAB — COMPREHENSIVE METABOLIC PANEL
ALT: 34 U/L (ref 0–55)
AST: 34 U/L (ref 5–34)
Albumin: 3.5 g/dL (ref 3.5–5.0)
Alkaline Phosphatase: 106 U/L (ref 40–150)
Anion gap: 6 (ref 3–11)
BUN: 13 mg/dL (ref 7–26)
CO2: 27 mmol/L (ref 22–29)
Calcium: 8.8 mg/dL (ref 8.4–10.4)
Chloride: 107 mmol/L (ref 98–109)
Creatinine, Ser: 0.84 mg/dL (ref 0.60–1.10)
GFR calc Af Amer: >60 mL/min (ref >60)
GFR calc non Af Amer: >60 mL/min (ref >60)
Glucose, Bld: 241 mg/dL — ABNORMAL HIGH (ref 70–140)
Potassium: 4.4 mmol/L (ref 3.5–5.1)
Sodium: 140 mmol/L (ref 136–145)
Total Bilirubin: <0.2 mg/dL — ABNORMAL LOW (ref 0.2–1.2)
Total Protein: 6.9 g/dL (ref 6.4–8.3)

## 2017-12-15 LAB — CBC WITH DIFFERENTIAL/PLATELET
Basophils Absolute: 0 10*3/uL (ref 0.0–0.1)
Basophils Relative: 0 %
Eosinophils Absolute: 0.2 10*3/uL (ref 0.0–0.5)
Eosinophils Relative: 5 %
HCT: 34.5 % — ABNORMAL LOW (ref 34.8–46.6)
Hemoglobin: 11.2 g/dL — ABNORMAL LOW (ref 11.6–15.9)
Lymphocytes Relative: 21 %
Lymphs Abs: 0.9 10*3/uL (ref 0.9–3.3)
MCH: 30.9 pg (ref 25.1–34.0)
MCHC: 32.5 g/dL (ref 31.5–36.0)
MCV: 95.3 fL (ref 79.5–101.0)
Monocytes Absolute: 0.5 10*3/uL (ref 0.1–0.9)
Monocytes Relative: 12 %
Neutro Abs: 2.6 10*3/uL (ref 1.5–6.5)
Neutrophils Relative %: 62 %
Platelets: 280 10*3/uL (ref 145–400)
RBC: 3.62 MIL/uL — ABNORMAL LOW (ref 3.70–5.45)
RDW: 12.5 % (ref 11.2–14.5)
WBC: 4.2 10*3/uL (ref 3.9–10.3)

## 2017-12-15 NOTE — Telephone Encounter (Signed)
Gave avs and calendar ° °

## 2017-12-15 NOTE — Patient Instructions (Signed)
Bone Health Bones protect organs, store calcium, and anchor muscles. Good health habits, such as eating nutritious foods and exercising regularly, are important for maintaining healthy bones. They can also help to prevent a condition that causes bones to lose density and become weak and brittle (osteoporosis). Why is bone mass important? Bone mass refers to the amount of bone tissue that you have. The higher your bone mass, the stronger your bones. An important step toward having healthy bones throughout life is to have strong and dense bones during childhood. A young adult who has a high bone mass is more likely to have a high bone mass later in life. Bone mass at its greatest it is called peak bone mass. A large decline in bone mass occurs in older adults. In women, it occurs about the time of menopause. During this time, it is important to practice good health habits, because if more bone is lost than what is replaced, the bones will become less healthy and more likely to break (fracture). If you find that you have a low bone mass, you may be able to prevent osteoporosis or further bone loss by changing your diet and lifestyle. How can I find out if my bone mass is low? Bone mass can be measured with an X-ray test that is called a bone mineral density (BMD) test. This test is recommended for all women who are age 65 or older. It may also be recommended for men who are age 70 or older, or for people who are more likely to develop osteoporosis due to:  Having bones that break easily.  Having a long-term disease that weakens bones, such as kidney disease or rheumatoid arthritis.  Having menopause earlier than normal.  Taking medicine that weakens bones, such as steroids, thyroid hormones, or hormone treatment for breast cancer or prostate cancer.  Smoking.  Drinking three or more alcoholic drinks each day.  What are the nutritional recommendations for healthy bones? To have healthy bones, you  need to get enough of the right minerals and vitamins. Most nutrition experts recommend getting these nutrients from the foods that you eat. Nutritional recommendations vary from person to person. Ask your health care provider what is healthy for you. Here are some general guidelines. Calcium Recommendations Calcium is the most important (essential) mineral for bone health. Most people can get enough calcium from their diet, but supplements may be recommended for people who are at risk for osteoporosis. Good sources of calcium include:  Dairy products, such as low-fat or nonfat milk, cheese, and yogurt.  Dark green leafy vegetables, such as bok choy and broccoli.  Calcium-fortified foods, such as orange juice, cereal, bread, soy beverages, and tofu products.  Nuts, such as almonds.  Follow these recommended amounts for daily calcium intake:  Children, age 1?3: 700 mg.  Children, age 4?8: 1,000 mg.  Children, age 9?13: 1,300 mg.  Teens, age 14?18: 1,300 mg.  Adults, age 19?50: 1,000 mg.  Adults, age 51?70: ? Men: 1,000 mg. ? Women: 1,200 mg.  Adults, age 71 or older: 1,200 mg.  Pregnant and breastfeeding females: ? Teens: 1,300 mg. ? Adults: 1,000 mg.  Vitamin D Recommendations Vitamin D is the most essential vitamin for bone health. It helps the body to absorb calcium. Sunlight stimulates the skin to make vitamin D, so be sure to get enough sunlight. If you live in a cold climate or you do not get outside often, your health care provider may recommend that you take vitamin   D supplements. Good sources of vitamin D in your diet include:  Egg yolks.  Saltwater fish.  Milk and cereal fortified with vitamin D.  Follow these recommended amounts for daily vitamin D intake:  Children and teens, age 1?18: 600 international units.  Adults, age 50 or younger: 400-800 international units.  Adults, age 51 or older: 800-1,000 international units.  Other Nutrients Other nutrients  for bone health include:  Phosphorus. This mineral is found in meat, poultry, dairy foods, nuts, and legumes. The recommended daily intake for adult men and adult women is 700 mg.  Magnesium. This mineral is found in seeds, nuts, dark green vegetables, and legumes. The recommended daily intake for adult men is 400?420 mg. For adult women, it is 310?320 mg.  Vitamin K. This vitamin is found in green leafy vegetables. The recommended daily intake is 120 mg for adult men and 90 mg for adult women.  What type of physical activity is best for building and maintaining healthy bones? Weight-bearing and strength-building activities are important for building and maintaining peak bone mass. Weight-bearing activities cause muscles and bones to work against gravity. Strength-building activities increases muscle strength that supports bones. Weight-bearing and muscle-building activities include:  Walking and hiking.  Jogging and running.  Dancing.  Gym exercises.  Lifting weights.  Tennis and racquetball.  Climbing stairs.  Aerobics.  Adults should get at least 30 minutes of moderate physical activity on most days. Children should get at least 60 minutes of moderate physical activity on most days. Ask your health care provide what type of exercise is best for you. Where can I find more information? For more information, check out the following websites:  National Osteoporosis Foundation: http://nof.org/learn/basics  National Institutes of Health: http://www.niams.nih.gov/Health_Info/Bone/Bone_Health/bone_health_for_life.asp  This information is not intended to replace advice given to you by your health care provider. Make sure you discuss any questions you have with your health care provider. Document Released: 10/24/2003 Document Revised: 02/21/2016 Document Reviewed: 08/08/2014 Elsevier Interactive Patient Education  2018 Elsevier Inc.  

## 2017-12-15 NOTE — Progress Notes (Signed)
Both one was supposed to be a nice day the morning was fine.  Tiffany Browning  [phone] [phone]) [phone]     ID: Tiffany Browning DOB: 08/04/1956  MR#: [id number]  OVZ#:[id number]  Patient Care Team: Tiffany Browning, Tiffany Apa, DO as PCP - General (Family Medicine) Reynold Bowen, MD as Consulting Physician (Endocrinology) [name], MD as Consulting Physician (General Surgery) [name], [name], MD as Consulting Physician (Oncology) [name], MD as Attending Physician (Radiation Oncology) [name], NP as Nurse Practitioner (Hematology and Oncology) PCP: Tiffany Browning, Tiffany Apa, DO GYN: OTHER MD:  CHIEF COMPLAINT: Estrogen receptor positive breast cancer  CURRENT TREATMENT:  tamoxifen   BREAST CANCER HISTORY: From the original intake note:  Tiffany Browning had routine physical exam by her gynecologist in May 2017 and she was able to palpate a mass in the upper-outer quadrant of her right breast. The patient had had bilateral screening mammography 06/26/2015 at the [facility], with no suspicious findings. On 12/31/2015 she underwent bilateral diagnostic mammography with tomography and right breast ultrasonography. The breast density was category D. On focal spot compression in the area of palpable concern there was a possible mass, hard to make out because of the breast density. On exam there was a firm palpable lump measuring approximately 3 cm at the 9:00 region of the right breast, 4 cm from the nipple. The overlying skin was unremarkable. Ultrasonography confirmed an irregular hypoechoic mass in the area in question measuring 3.0 cm.  Also, separate from the palpable mass at the 9:00 position but 2 cm from the nipple was a hypoechoic oval mass measuring 2.0 cm. Ultrasound of the right axilla was negative.  Biopsy of both the masses in question from the right rest were obtained 01/09/2016. This showed ([id number] [id number]) the mass closer to the  nipple, S2 centimeters, to be a fibroadenoma. The more distant mass, 4 cm from the nipple, was invasive ductal carcinoma, E-cadherin positive, grade 2, estrogen receptor 100% positive, progesterone receptor 100% positive, with an MIB-1 of 30%, and no HER-2 amplification, the signals ratio being 1.43 and the number per cell 2.15.  The patient's subsequent history is as detailed below  INTERVAL HISTORY: Tiffany Browning returns today for follow-up of her estrogen receptor positive breast cancer. She continues on tamoxifen, generally with good tolerance. She does have hot flashes. She continues on Gabapentin and Effexor for hot flashes and those seem to help.     REVIEW OF SYSTEMS: Tiffany Browning is exercising three times per week.  She is having difficulty with losing weight and wants my opinion on going to a wellness center.  She has noted some numbness in her fingertips when sleeping.  Otherwise she doing well and a detailed ROS is non contributory today.  She will be due for mammogram in June of this year.      [name] PAST MEDICAL HISTORY: Past Medical History:  Diagnosis Date  . Allergy   . Asthma   . Breast cancer ([place])   . Diabetes mellitus    Type 1  . Family history of breast cancer   . Family history of kidney cancer   . Heart murmur   . Hypothyroidism   . Personal history of radiation therapy   . Thyroid disease    Hypothyroidism    PAST SURGICAL HISTORY: Past Surgical History:  Procedure Laterality Date  . APPENDECTOMY    . BREAST BIOPSY Right 02/14/2016  . BREAST BIOPSY Right 01/09/2016  . BREAST LUMPECTOMY Right  03/26/2016  . BREAST LUMPECTOMY WITH AXILLARY LYMPH NODE BIOPSY Right 03/26/2016   Procedure: RIGHT BREAST LUMPECTOMY WITH AXILLARY LYMPH NODE BIOPSY;  Surgeon: Autumn Messing III, MD;  Location: [facility];  Service: General;  Laterality: Right;  . NASAL SINUS SURGERY    . ruptured ovarian cyst    . SHOULDER ARTHROSCOPY  2004   b/L --second one 2011  . TONSILECTOMY, ADENOIDECTOMY,  BILATERAL MYRINGOTOMY AND TUBES    . TUBAL LIGATION     then reversed it    FAMILY HISTORY Family History  Problem Relation Age of Onset  . Cancer Mother        cervical  . Hypertension Mother   . Hyperlipidemia Mother   . Heart disease Father 95       MI---- cabg before that  . Hypothyroidism Sister   . Breast cancer Sister 31  . Heart disease Brother 40       MI--- second one 40  . Hyperlipidemia Brother   . Hypertension Brother   . Hypothyroidism Sister   . Kidney cancer Other 89       son of her sister with breast cancer  The patient's father died from heart disease in his 75s in the setting of tobacco abuse. The patient's mother died from pneumonia at the age of 42 also in the setting of tobacco abuse. The patient had one brother, 2 sisters. One of the sisters, Tiffany Browning, was diagnosed with breast cancer at the age of 7. The patient's mother may have had a history of cervical cancer. There is no history of ovarian cancer in the family.   GYNECOLOGIC HISTORY:  No LMP recorded. Patient is postmenopausal.  menarche 68. The patient is GX P0. Tiffany Browning underwent total abdominal hysterectomy with bilateral salpingo-oophorectomy in 1990 for what was thought at that time might be ovarian cancer but proved to be a benign cyst. She has been on estrogen replacement until 01/16/2016 .  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 She is taking courses in advanced codeine at [employer] in is going to be "graduating" late July. Her husband [name] (Tiffany Browning) works for [employer]. ItIs just the 2 of them plus their dog Jasmine at home.     ADVANCED DIRECTIVES:  in place: Both have a living well.    HEALTH MAINTENANCE: Social History   Tobacco Use  . Smoking status: Never Smoker  . Smokeless tobacco: Never Used  Substance Use Topics  . Alcohol use: Yes    Comment: occ  . Drug use: No     Colonoscopy:2007/  [facility]  PAP: s/p hysterectomy  Bone density:  Lipid panel:  Allergies  Allergen Reactions  . Clearsil [Benzoyl Peroxide] Rash    Current Outpatient Medications  Medication Sig Dispense Refill  . albuterol (PROVENTIL) (2.5 MG/3ML) 0.083% nebulizer solution Take 3 mLs (2.5 mg total) by nebulization every 6 (six) hours as needed for wheezing or shortness of breath. 150 mL 1  . beclomethasone (QVAR) 80 MCG/ACT inhaler Inhale into the lungs.    . docusate sodium (COLACE) 100 MG capsule Take 100 mg by mouth daily.    . fish oil-omega-3 fatty acids 1000 MG capsule Take 1 g by mouth daily.      [name] gabapentin (NEURONTIN) 300 MG capsule TAKE 1 CAPSULE (300 MG TOTAL) BY MOUTH AT BEDTIME. 90 capsule 0  . insulin aspart (NOVOLOG) 100 UNIT/ML injection Per Dr Forde Dandy (Patient taking differently: Inject 0.3-0.4  Units into the skin continuous. Insulin pump-Per Dr Forde Dandy) 3 vial PRN  . levothyroxine (SYNTHROID, LEVOTHROID) 175 MCG tablet Take 175 mcg by mouth daily. Take all days except on Sunday    . montelukast (SINGULAIR) 10 MG tablet Take 10 mg by mouth at bedtime.      . Multiple Vitamin (MULTIVITAMIN) tablet Take 1 tablet by mouth daily.      . ONE TOUCH ULTRA TEST test strip TEST 8 TIMES DAILY OR AS DIRECTED  2  . PROAIR RESPICLICK 427 (90 Base) MCG/ACT AEPB     . rosuvastatin (CRESTOR) 5 MG tablet Take 5 mg by mouth daily.  3  . tamoxifen (NOLVADEX) 20 MG tablet Take 1 tablet (20 mg total) by mouth daily. 90 tablet 4  . venlafaxine XR (EFFEXOR-XR) 75 MG 24 hr capsule TAKE 2 CAPSULES (75 MG TOTAL) BY MOUTH DAILY WITH BREAKFAST. 180 capsule 0   No current facility-administered medications for this visit.     OBJECTIVE:  Vitals:   12/15/17 0951  BP: (!) 119/56  Pulse: 68  Resp: 16  Temp: 98.4 F (36.9 C)  SpO2: 100%     Body mass index is 25.07 kg/m.    ECOG FS:0 - Asymptomatic  GENERAL: Patient is a well appearing female in no acute distress HEENT:  Sclerae anicteric.  Oropharynx clear  and moist. No ulcerations or evidence of oropharyngeal candidiasis. Neck is supple.  NODES:  No cervical, supraclavicular, or axillary lymphadenopathy palpated.  BREAST EXAM:  S/p right lumpectomy without nodularity, mass, nodule, skin changes, left breast without nodules, masses, skin or nipple changes LUNGS:  Clear to auscultation bilaterally.  No wheezes or rhonchi. HEART:  Regular rate and rhythm. No murmur appreciated. ABDOMEN:  Soft, nontender.  Positive, normoactive bowel sounds. No organomegaly palpated. MSK:  No focal spinal tenderness to palpation. Full range of motion bilaterally in the upper extremities. EXTREMITIES:  No peripheral edema.   SKIN:  Clear with no obvious rashes or skin changes. No nail dyscrasia. NEURO:  Nonfocal. Well oriented.  Appropriate affect.    LAB RESULTS:  CMP     Component Value Date/Time   NA 140 12/15/2017 0919   NA 140 12/15/2016 1234   K 4.4 12/15/2017 0919   K 4.8 12/15/2016 1234   CL 107 12/15/2017 0919   CO2 27 12/15/2017 0919   CO2 28 12/15/2016 1234   GLUCOSE 241 (H) 12/15/2017 0919   GLUCOSE 133 12/15/2016 1234   BUN 13 12/15/2017 0919   BUN 14.6 12/15/2016 1234   CREATININE 0.84 12/15/2017 0919   CREATININE 0.8 12/15/2016 1234   CALCIUM 8.8 12/15/2017 0919   CALCIUM 9.0 12/15/2016 1234   PROT 6.9 12/15/2017 0919   PROT 7.2 12/15/2016 1234   ALBUMIN 3.5 12/15/2017 0919   ALBUMIN 3.7 12/15/2016 1234   AST 34 12/15/2017 0919   AST 31 12/15/2016 1234   ALT 34 12/15/2017 0919   ALT 30 12/15/2016 1234   ALKPHOS 106 12/15/2017 0919   ALKPHOS 109 12/15/2016 1234   BILITOT <0.2 (L) 12/15/2017 0919   BILITOT <0.22 12/15/2016 1234   GFRNONAA >60 12/15/2017 0919   GFRAA >60 12/15/2017 0919    INo results found for: SPEP, UPEP  Lab Results  Component Value Date   WBC 4.2 12/15/2017   NEUTROABS 2.6 12/15/2017   HGB 11.2 (L) 12/15/2017   HCT 34.5 (L) 12/15/2017   MCV 95.3 12/15/2017   PLT 280 12/15/2017      Chemistry  Component Value Date/Time   NA 140 12/15/2017 0919   NA 140 12/15/2016 1234   K 4.4 12/15/2017 0919   K 4.8 12/15/2016 1234   CL 107 12/15/2017 0919   CO2 27 12/15/2017 0919   CO2 28 12/15/2016 1234   BUN 13 12/15/2017 0919   BUN 14.6 12/15/2016 1234   CREATININE 0.84 12/15/2017 0919   CREATININE 0.8 12/15/2016 1234      Component Value Date/Time   CALCIUM 8.8 12/15/2017 0919   CALCIUM 9.0 12/15/2016 1234   ALKPHOS 106 12/15/2017 0919   ALKPHOS 109 12/15/2016 1234   AST 34 12/15/2017 0919   AST 31 12/15/2016 1234   ALT 34 12/15/2017 0919   ALT 30 12/15/2016 1234   BILITOT <0.2 (L) 12/15/2017 0919   BILITOT <0.22 12/15/2016 1234       No results found for: LABCA2  No components found for: LABCA125  No results for input(s): INR in the last 168 hours.  Urinalysis    Component Value Date/Time   COLORURINE yellow 02/06/2010 1109   APPEARANCEUR Clear 02/06/2010 1109   LABSPEC 1.015 02/06/2010 1109   PHURINE 6.5 02/06/2010 1109   HGBUR negative 02/06/2010 1109   BILIRUBINUR negative 03/07/2016 1150   PROTEINUR negative 03/07/2016 1150   UROBILINOGEN negative 03/07/2016 1150   UROBILINOGEN 0.2 02/06/2010 1109   NITRITE negative 03/07/2016 1150   NITRITE negative 02/06/2010 1109   LEUKOCYTESUR moderate (2+) (A) 03/07/2016 1150     STUDIES: No results found.  ELIGIBLE FOR AVAILABLE RESEARCH PROTOCOL: PALLAS  ASSESSMENT: 62 y.o. [place], [place] woman status post right breast upper outer quadrant biopsy 01/09/2016 for a clinical T2 N0, stage IIA invasive ductal carcinoma, grade 2, estrogen and progesterone receptor positive, HER-2 nonamplified, with an MIB-1 of 30%.   (a) biopsy of a second upper outer quadrant area of concern 02/14/2016 was benign  (1) neoadjuvant tamoxifen started 01/28/2016  (2) right lumpectomy and sentinel lymph node sampling 03/26/2016 showed a pT2 pN0, stage IIA invasive ductal carcinoma, grade 2, with negative margins  (3) Oncotype DX score of  11 predicts a 10 year risk of recurrence outside the breast of 8% if the patient's only systemic therapy is tamoxifen for 5 years. It also predicted no benefit from chemotherapy  (4) adjuvant radiation completed 06/08/2016  (5) tamoxifen continued through local treatment--never interrupted   (6). Genetics testing of the patient's nephew (kidney cancer at age 7) has been recommended  PLAN:  Tikesha is doing well today.  She will continue taking Tamoxifen.  She has no sign of recurrence and is tolerating it well.  She will proceed with mammogram in June, orders placed today.  I gave her a handout on bone health.  She says her GYN checked her bone density about a year ago and it was consistent with osteopenia.  Recommend continued f/u with GYN.  I recommended that she continue to work hard on healthy diet and exercise.    Tiffany Browning will return in 1 year for labs and f/u with Dr. Jana Hakim.  She knows to call for any questions or concerns prior to her next appointment with [place].    A total of (20) minutes of face-to-face time was spent with this patient with greater than 50% of that time in counseling and care-coordination.   Scot Dock, NP   12/15/2017 10:16 AM Medical Oncology and Hematology [facility] [address] [place], [place] [address] Tel. [phone]    Fax. [phone]

## 2018-01-09 ENCOUNTER — Other Ambulatory Visit: Payer: Self-pay | Admitting: Oncology

## 2018-01-11 DIAGNOSIS — E1142 Type 2 diabetes mellitus with diabetic polyneuropathy: Secondary | ICD-10-CM | POA: Diagnosis not present

## 2018-01-11 DIAGNOSIS — G5601 Carpal tunnel syndrome, right upper limb: Secondary | ICD-10-CM | POA: Diagnosis not present

## 2018-01-11 DIAGNOSIS — Z1389 Encounter for screening for other disorder: Secondary | ICD-10-CM | POA: Diagnosis not present

## 2018-01-11 DIAGNOSIS — Z794 Long term (current) use of insulin: Secondary | ICD-10-CM | POA: Insufficient documentation

## 2018-01-11 DIAGNOSIS — C50411 Malignant neoplasm of upper-outer quadrant of right female breast: Secondary | ICD-10-CM | POA: Diagnosis not present

## 2018-01-11 DIAGNOSIS — E1040 Type 1 diabetes mellitus with diabetic neuropathy, unspecified: Secondary | ICD-10-CM | POA: Diagnosis not present

## 2018-01-20 ENCOUNTER — Encounter: Payer: Self-pay | Admitting: Family Medicine

## 2018-01-20 ENCOUNTER — Ambulatory Visit: Payer: 59 | Admitting: Family Medicine

## 2018-01-20 ENCOUNTER — Inpatient Hospital Stay: Admission: RE | Admit: 2018-01-20 | Payer: 59 | Source: Ambulatory Visit

## 2018-01-20 VITALS — BP 122/62 | HR 63 | Resp 16 | Ht 61.5 in | Wt 135.6 lb

## 2018-01-20 DIAGNOSIS — R300 Dysuria: Secondary | ICD-10-CM | POA: Diagnosis not present

## 2018-01-20 LAB — POCT URINALYSIS DIP (MANUAL ENTRY)
Bilirubin, UA: NEGATIVE
Blood, UA: NEGATIVE
Glucose, UA: 500 mg/dL — AB
Ketones, POC UA: NEGATIVE mg/dL
Leukocytes, UA: NEGATIVE
Nitrite, UA: NEGATIVE
Protein Ur, POC: NEGATIVE mg/dL
Spec Grav, UA: 1.02 (ref 1.010–1.025)
Urobilinogen, UA: 0.2 E.U./dL
pH, UA: 6 (ref 5.0–8.0)

## 2018-01-20 MED ORDER — CEPHALEXIN 500 MG PO CAPS: 500.0000 mg | ORAL_CAPSULE | Freq: Two times a day (BID) | ORAL | 0 refills | Status: DC

## 2018-01-20 NOTE — Progress Notes (Addendum)
[facility] [address] [place], [place] [address] [phone]  Date:  01/20/2018   Name:  Tiffany Browning   DOB:  [birth year]   MRN:  [id number]  PCP:  [name], DO    Chief Complaint: Urinary Tract Infection (burning, frequency, started yesterday)   History of Present Illness:  Tiffany Browning is a 62 y.o. very pleasant female patient who presents with the following:  Pt of Dr. Etter Sjogren here today with possible UTI History of DM1- Dr. Forde Dandy is her endocrinologist Also history of breast cancer currently on tamoxifen  She has noted urinary burning- she has noted this for about 24 hours.  This seems like a UTI for her so she came in to be treated  Also notes urinary frequency No blood No belly pain, back pain or fever  Some vaginal itching- she will use monistat OTC if needed for yeast   Her most recent A1c was 6.7% 2 weeks ago per her report Her glucose was in the 200s this am   Patient Active Problem List   Diagnosis Date Noted  . Pansinusitis 12/21/2016  . Bronchitis 10/31/2016  . Family history of breast cancer   . Family history of kidney cancer   . Breast cancer of upper-outer quadrant of right female breast ([place]) 01/28/2016  . Sinusitis, acute maxillary 07/11/2014  . Wheezing 07/11/2014  . VAGINITIS, CANDIDAL 02/06/2010  . VAGINITIS, BACTERIAL 02/06/2010  . DYSURIA 09/20/2007  . HYPOTHYROIDISM 02/21/2007  . DIABETES MELLITUS, TYPE I 02/21/2007  . EXTERNAL OTITIS 02/21/2007  . ALLERGIC RHINITIS 02/21/2007  . ASTHMA 02/21/2007    Past Medical History:  Diagnosis Date  . Allergy   . Asthma   . Breast cancer ([place])   . Diabetes mellitus    Type 1  . Family history of breast cancer   . Family history of kidney cancer   . Heart murmur   . Hypothyroidism   . Personal history of radiation therapy   . Thyroid disease    Hypothyroidism    Past Surgical History:  Procedure  Laterality Date  . APPENDECTOMY    . BREAST BIOPSY Right 02/14/2016  . BREAST BIOPSY Right 01/09/2016  . BREAST LUMPECTOMY Right 03/26/2016  . BREAST LUMPECTOMY WITH AXILLARY LYMPH NODE BIOPSY Right 03/26/2016   Procedure: RIGHT BREAST LUMPECTOMY WITH AXILLARY LYMPH NODE BIOPSY;  Surgeon: Autumn Messing III, MD;  Location: [facility];  Service: General;  Laterality: Right;  . NASAL SINUS SURGERY    . ruptured ovarian cyst    . SHOULDER ARTHROSCOPY  2004   b/L --second one 2011  . TONSILECTOMY, ADENOIDECTOMY, BILATERAL MYRINGOTOMY AND TUBES    . TUBAL LIGATION     then reversed it    Social History   Tobacco Use  . Smoking status: Never Smoker  . Smokeless tobacco: Never Used  Substance Use Topics  . Alcohol use: Yes    Comment: occ  . Drug use: No    Family History  Problem Relation Age of Onset  . Cancer Mother        cervical  . Hypertension Mother   . Hyperlipidemia Mother   . Heart disease Father 29       MI---- cabg before that  . Hypothyroidism Sister   . Breast cancer Sister 32  . Heart disease Brother 19       MI--- second one 43  . Hyperlipidemia Brother   .  Hypertension Brother   . Hypothyroidism Sister   . Kidney cancer Other 64       son of her sister with breast cancer    Allergies  Allergen Reactions  . Clearsil [Benzoyl Peroxide] Rash    Medication list has been reviewed and updated.  Current Outpatient Medications on File Prior to Visit  Medication Sig Dispense Refill  . albuterol (PROVENTIL) (2.5 MG/3ML) 0.083% nebulizer solution Take 3 mLs (2.5 mg total) by nebulization every 6 (six) hours as needed for wheezing or shortness of breath. 150 mL 1  . beclomethasone (QVAR) 80 MCG/ACT inhaler Inhale into the lungs.    . docusate sodium (COLACE) 100 MG capsule Take 100 mg by mouth daily.    . fish oil-omega-3 fatty acids 1000 MG capsule Take 1 g by mouth daily.      [name] gabapentin (NEURONTIN) 300 MG capsule TAKE 1 CAPSULE (300 MG TOTAL) BY MOUTH AT BEDTIME.  90 capsule 0  . insulin aspart (NOVOLOG) 100 UNIT/ML injection Per Dr Forde Dandy (Patient taking differently: Inject 0.3-0.4 Units into the skin continuous. Insulin pump-Per Dr Forde Dandy) 3 vial PRN  . levothyroxine (SYNTHROID, LEVOTHROID) 175 MCG tablet Take 175 mcg by mouth daily. Take all days except on Sunday    . montelukast (SINGULAIR) 10 MG tablet Take 10 mg by mouth at bedtime.      . Multiple Vitamin (MULTIVITAMIN) tablet Take 1 tablet by mouth daily.      . ONE TOUCH ULTRA TEST test strip TEST 8 TIMES DAILY OR AS DIRECTED  2  . PROAIR RESPICLICK 854 (90 Base) MCG/ACT AEPB     . rosuvastatin (CRESTOR) 5 MG tablet Take 5 mg by mouth daily.  3  . tamoxifen (NOLVADEX) 20 MG tablet Take 1 tablet (20 mg total) by mouth daily. 90 tablet 4  . venlafaxine XR (EFFEXOR-XR) 75 MG 24 hr capsule TAKE 2 CAPSULES (75 MG TOTAL) BY MOUTH DAILY WITH BREAKFAST. 180 capsule 0   No current facility-administered medications on file prior to visit.     Review of Systems:  As per HPI- otherwise negative. No fever or chills Eating normally   Physical Examination: Vitals:   01/20/18 1010  BP: 122/62  Pulse: 63  Resp: 16  SpO2: 97%   Vitals:   01/20/18 1010  Weight: 135 lb 9.6 oz (61.5 kg)  Height: 5' 1.5" (1.562 m)   Body mass index is 25.21 kg/m. Ideal Body Weight: Weight in (lb) to have BMI = 25: 134.2  GEN: WDWN, NAD, Non-toxic, A & O x 3, looks well  HEENT: Atraumatic, Normocephalic. Neck supple. No masses, No LAD. Ears and Nose: No external deformity. CV: RRR, No M/G/R. No JVD. No thrill. No extra heart sounds. PULM: CTA B, no wheezes, crackles, rhonchi. No retractions. No resp. distress. No accessory muscle use. ABD: S, NT, ND. No rebound. No HSM. No CVA tenderness abd is benign EXTR: No c/c/e NEURO Normal gait.  PSYCH: Normally interactive. Conversant. Not depressed or anxious appearing.  Calm demeanor.   Results for orders placed or performed in visit on 01/20/18  POCT urinalysis  dipstick  Result Value Ref Range   Color, UA yellow yellow   Clarity, UA clear clear   Glucose, UA =500 (A) negative mg/dL   Bilirubin, UA negative negative   Ketones, POC UA negative negative mg/dL   Spec Grav, UA 1.020 1.010 - 1.025   Blood, UA negative negative   pH, UA 6.0 5.0 - 8.0   Protein Ur,  POC negative negative mg/dL   Urobilinogen, UA 0.2 0.2 or 1.0 E.U./dL   Nitrite, UA Negative Negative   Leukocytes, UA Negative Negative    Assessment and Plan: Dysuria - Plan: Urine Culture, POCT urinalysis dipstick, cephALEXin (KEFLEX) 500 MG capsule  Here today with presumed uti, sx for 24 hours Urine dip shows glucose only, but will treat based on sx and await culture Keflex BID Follow-up pending culture She is asked to alert me if not improved in 1-2 days- Sooner if worse.    Signed Lamar Blinks, MD  Received her urine culture 6/8  Results for orders placed or performed in visit on 01/20/18  Urine Culture  Result Value Ref Range   MICRO NUMBER: [id number]    SPECIMEN QUALITY: ADEQUATE    Sample Source URINE    STATUS: FINAL    ISOLATE 1:      Single organism less than 10,000 CFU/mL isolated. These organisms, commonly found on external and internal genitalia, are considered colonizers. No further testing performed.  POCT urinalysis dipstick  Result Value Ref Range   Color, UA yellow yellow   Clarity, UA clear clear   Glucose, UA =500 (A) negative mg/dL   Bilirubin, UA negative negative   Ketones, POC UA negative negative mg/dL   Spec Grav, UA 1.020 1.010 - 1.025   Blood, UA negative negative   pH, UA 6.0 5.0 - 8.0   Protein Ur, POC negative negative mg/dL   Urobilinogen, UA 0.2 0.2 or 1.0 E.U./dL   Nitrite, UA Negative Negative   Leukocytes, UA Negative Negative   Message to pt

## 2018-01-20 NOTE — Patient Instructions (Signed)
We will start treatment for presumed UTI with keflex twice a day. I will check a urine culture and be in touch with your results  Please let me know if you are not getting better- sooner if worse, fever, vomiting, pain  Take care!

## 2018-01-21 LAB — URINE CULTURE
MICRO NUMBER:: 90681480
SPECIMEN QUALITY:: ADEQUATE

## 2018-01-22 ENCOUNTER — Encounter: Payer: Self-pay | Admitting: Family Medicine

## 2018-02-02 ENCOUNTER — Ambulatory Visit
Admission: RE | Admit: 2018-02-02 | Discharge: 2018-02-02 | Disposition: A | Discharge status: Home / Self Care | Payer: 59 | Source: Ambulatory Visit | Attending: Adult Health | Admitting: Adult Health

## 2018-02-02 DIAGNOSIS — R922 Inconclusive mammogram: Secondary | ICD-10-CM | POA: Diagnosis not present

## 2018-02-02 DIAGNOSIS — C50411 Malignant neoplasm of upper-outer quadrant of right female breast: Secondary | ICD-10-CM

## 2018-02-02 DIAGNOSIS — Z170 Estrogen receptor positive status [ER+]: Secondary | ICD-10-CM

## 2018-02-02 DIAGNOSIS — Z853 Personal history of malignant neoplasm of breast: Secondary | ICD-10-CM | POA: Diagnosis not present

## 2018-02-15 ENCOUNTER — Other Ambulatory Visit: Payer: Self-pay | Admitting: Oncology

## 2018-03-14 DIAGNOSIS — M25521 Pain in right elbow: Secondary | ICD-10-CM | POA: Diagnosis not present

## 2018-03-14 DIAGNOSIS — M7701 Medial epicondylitis, right elbow: Secondary | ICD-10-CM | POA: Diagnosis not present

## 2018-03-14 DIAGNOSIS — M79645 Pain in left finger(s): Secondary | ICD-10-CM | POA: Insufficient documentation

## 2018-03-14 DIAGNOSIS — M7700 Medial epicondylitis, unspecified elbow: Secondary | ICD-10-CM | POA: Insufficient documentation

## 2018-03-14 DIAGNOSIS — M65342 Trigger finger, left ring finger: Secondary | ICD-10-CM | POA: Diagnosis not present

## 2018-03-25 DIAGNOSIS — E1065 Type 1 diabetes mellitus with hyperglycemia: Secondary | ICD-10-CM | POA: Diagnosis not present

## 2018-04-11 DIAGNOSIS — M7701 Medial epicondylitis, right elbow: Secondary | ICD-10-CM | POA: Diagnosis not present

## 2018-04-15 ENCOUNTER — Other Ambulatory Visit: Payer: Self-pay | Admitting: Oncology

## 2018-05-21 ENCOUNTER — Other Ambulatory Visit: Payer: Self-pay | Admitting: Oncology

## 2018-06-09 DIAGNOSIS — M65342 Trigger finger, left ring finger: Secondary | ICD-10-CM | POA: Diagnosis not present

## 2018-06-09 DIAGNOSIS — M25521 Pain in right elbow: Secondary | ICD-10-CM | POA: Diagnosis not present

## 2018-06-20 DIAGNOSIS — E7849 Other hyperlipidemia: Secondary | ICD-10-CM | POA: Diagnosis not present

## 2018-06-20 DIAGNOSIS — I744 Embolism and thrombosis of arteries of extremities, unspecified: Secondary | ICD-10-CM | POA: Diagnosis not present

## 2018-06-20 DIAGNOSIS — E1040 Type 1 diabetes mellitus with diabetic neuropathy, unspecified: Secondary | ICD-10-CM | POA: Diagnosis not present

## 2018-07-04 DIAGNOSIS — E1065 Type 1 diabetes mellitus with hyperglycemia: Secondary | ICD-10-CM | POA: Diagnosis not present

## 2018-07-13 ENCOUNTER — Other Ambulatory Visit: Payer: Self-pay | Admitting: Oncology

## 2018-07-17 ENCOUNTER — Other Ambulatory Visit: Payer: Self-pay | Admitting: Oncology

## 2018-07-27 DIAGNOSIS — M65331 Trigger finger, right middle finger: Secondary | ICD-10-CM | POA: Diagnosis not present

## 2018-07-27 DIAGNOSIS — M6530 Trigger finger, unspecified finger: Secondary | ICD-10-CM | POA: Insufficient documentation

## 2018-08-22 ENCOUNTER — Other Ambulatory Visit: Payer: Self-pay | Admitting: Oncology

## 2018-08-31 DIAGNOSIS — M65342 Trigger finger, left ring finger: Secondary | ICD-10-CM | POA: Diagnosis not present

## 2018-08-31 DIAGNOSIS — M65331 Trigger finger, right middle finger: Secondary | ICD-10-CM | POA: Diagnosis not present

## 2018-09-19 DIAGNOSIS — M65342 Trigger finger, left ring finger: Secondary | ICD-10-CM | POA: Diagnosis not present

## 2018-10-03 DIAGNOSIS — M79645 Pain in left finger(s): Secondary | ICD-10-CM | POA: Diagnosis not present

## 2018-10-05 DIAGNOSIS — E1065 Type 1 diabetes mellitus with hyperglycemia: Secondary | ICD-10-CM | POA: Diagnosis not present

## 2018-10-11 ENCOUNTER — Other Ambulatory Visit: Payer: Self-pay | Admitting: Oncology

## 2018-10-18 ENCOUNTER — Other Ambulatory Visit: Payer: Self-pay | Admitting: Oncology

## 2018-10-20 DIAGNOSIS — E1040 Type 1 diabetes mellitus with diabetic neuropathy, unspecified: Secondary | ICD-10-CM | POA: Diagnosis not present

## 2018-10-20 DIAGNOSIS — Z794 Long term (current) use of insulin: Secondary | ICD-10-CM | POA: Diagnosis not present

## 2018-10-20 DIAGNOSIS — E7849 Other hyperlipidemia: Secondary | ICD-10-CM | POA: Diagnosis not present

## 2018-10-24 DIAGNOSIS — M65331 Trigger finger, right middle finger: Secondary | ICD-10-CM | POA: Diagnosis not present

## 2018-11-11 ENCOUNTER — Encounter: Payer: Self-pay | Admitting: *Deleted

## 2018-11-17 ENCOUNTER — Other Ambulatory Visit: Payer: Self-pay | Admitting: Oncology

## 2018-11-18 DIAGNOSIS — R200 Anesthesia of skin: Secondary | ICD-10-CM | POA: Diagnosis not present

## 2018-12-02 DIAGNOSIS — E1065 Type 1 diabetes mellitus with hyperglycemia: Secondary | ICD-10-CM | POA: Diagnosis not present

## 2018-12-12 DIAGNOSIS — Z4681 Encounter for fitting and adjustment of insulin pump: Secondary | ICD-10-CM | POA: Diagnosis not present

## 2018-12-12 DIAGNOSIS — E1040 Type 1 diabetes mellitus with diabetic neuropathy, unspecified: Secondary | ICD-10-CM | POA: Diagnosis not present

## 2018-12-12 DIAGNOSIS — Z794 Long term (current) use of insulin: Secondary | ICD-10-CM | POA: Diagnosis not present

## 2018-12-15 ENCOUNTER — Telehealth: Payer: Self-pay | Admitting: Oncology

## 2018-12-15 NOTE — Telephone Encounter (Signed)
Spoke with pt. Re: appt on 12/19/18 letting her know it has been changed to virtual appt.  Emailed step-by-step instructions on how to download and access meeting.

## 2018-12-16 NOTE — Progress Notes (Signed)
[facility]  [phone] [phone]     ID: Tiffany Browning DOB: 1956-04-19  MR#: [id number]  WCH#:[id number]  Patient Care Team: Carollee Herter, Alferd Apa, DO as PCP - General (Family Medicine) Reynold Bowen, MD as Consulting Physician (Endocrinology) [name], MD as Consulting Physician (General Surgery) Sharrell Krawiec, [name], MD as Consulting Physician (Oncology) [name], MD as Attending Physician (Radiation Oncology) [name], NP as Nurse Practitioner (Hematology and Oncology) PCP: Carollee Herter, Alferd Apa, DO GYN: OTHER MD:  I connected with Tiffany Browning on 12/19/18 at 10:30 AM EDT by video enabled telemedicine visit and verified that I am speaking with the correct person using two identifiers.   I discussed the limitations, risks, security and privacy concerns of performing an evaluation and management service by telemedicine and the availability of in-person appointments. I also discussed with the patient that there may be a patient responsible charge related to this service. The patient expressed understanding and agreed to proceed.   Other persons participating in the visit and their role in the encounter: Wilburn Mylar, scribe   Patients location: home  Providers location: [place]: Estrogen receptor positive breast cancer  CURRENT TREATMENT:  tamoxifen   INTERVAL HISTORY: Tiffany Browning returns today for follow-up of her estrogen receptor positive breast cancer.   She continues on tamoxifen, generally with good tolerance. She continues to have hot flashes, for which she takes Gabapentin and Effexor with relief. She notes they are less frequent now.   Since her last visit, she underwent diagnostic bilateral breast mammogram on 02/02/2018, showing: breast density category C; no mammographic evidence of malignancy in either breast.   REVIEW OF SYSTEMS: Tiffany Browning reports doing  well overall. Her husband works for [employer] outside the home, and she works outside the house two days a week in 10 hour shifts. She states they try to keep the house disinfected. She tends to her yard for exercise. The patient denies unusual headaches, visual changes, nausea, vomiting, stiff neck, dizziness, or gait imbalance. There has been no cough, phlegm production, or pleurisy, no chest pain or pressure, and no change in bowel or bladder habits. The patient denies fever, rash, bleeding, unexplained fatigue or unexplained weight loss. A detailed review of systems was otherwise entirely negative.   BREAST CANCER HISTORY: From the original intake note:  Tiffany Browning had routine physical exam by her gynecologist in May 2017 and she was able to palpate a mass in the upper-outer quadrant of her right breast. The patient had had bilateral screening mammography [facility]/26/2015 at [facility], with no suspicious findings. On 12/31/2015 she underwent bilateral diagnostic mammography with tomography and right breast ultrasonography. The breast density was category D. On focal spot compression in the area of palpable concern there was a possible mass, hard to make out because of the breast density. On exam there was a firm palpable lump measuring approximately 3 cm at the 9:00 region of the right breast, 4 cm from the nipple. The overlying skin was unremarkable. Ultrasonography confirmed an irregular hypoechoic mass in the area in question measuring 3.0 cm.  Also, separate from the palpable mass at the 9:00 position but 2 cm from the nipple was a hypoechoic oval mass measuring 2.0 cm. Ultrasound of the right axilla was negative.  Biopsy of both the masses in question from the right rest were obtained 01/09/2016. This showed (SAA [id number]) the mass closer to the nipple, S2 centimeters,  to be a fibroadenoma. The more distant mass, 4 cm from the nipple, was invasive ductal carcinoma, E-cadherin positive, grade 2,  estrogen receptor 100% positive, progesterone receptor 100% positive, with an MIB-1 of 30%, and no HER-2 amplification, the signals ratio being 1.43 and the number per cell 2.15.  The patient's subsequent history is as detailed above.     [name] PAST MEDICAL HISTORY: Past Medical History:  Diagnosis Date   Allergy    Asthma    Breast cancer ([place])    Diabetes mellitus    Type 1   Family history of breast cancer    Family history of kidney cancer    Heart murmur    Hypothyroidism    Personal history of radiation therapy    Thyroid disease    Hypothyroidism    PAST SURGICAL HISTORY: Past Surgical History:  Procedure Laterality Date   APPENDECTOMY     BREAST BIOPSY Right 02/14/2016   BREAST BIOPSY Right 01/09/2016   BREAST LUMPECTOMY Right 03/26/2016   BREAST LUMPECTOMY WITH AXILLARY LYMPH NODE BIOPSY Right 03/26/2016   Procedure: RIGHT BREAST LUMPECTOMY WITH AXILLARY LYMPH NODE BIOPSY;  Surgeon: Autumn Messing III, MD;  Location: [facility];  Service: General;  Laterality: Right;   NASAL SINUS SURGERY     ruptured ovarian cyst     SHOULDER ARTHROSCOPY  2004   b/L --second one 2011   TONSILECTOMY, ADENOIDECTOMY, BILATERAL MYRINGOTOMY AND TUBES     TUBAL LIGATION     then reversed it    FAMILY HISTORY Family History  Problem Relation Age of Onset   Cancer Mother        cervical   Hypertension Mother    Hyperlipidemia Mother    Heart disease Father 34       MI---- cabg before that   Hypothyroidism Sister    Breast cancer Sister 22   Heart disease Brother 45       MI--- second one 40   Hyperlipidemia Brother    Hypertension Brother    Hypothyroidism Sister    Kidney cancer Other 64       son of her sister with breast cancer  The patient's father died from heart disease in his 54s in the setting of tobacco abuse. The patient's mother died from pneumonia at the age of 69 also in the setting of tobacco abuse. The patient had one brother, 2 sisters. One  of the sisters, Tiffany Browning, was diagnosed with breast cancer at the age of 71. The patient's mother may have had a history of cervical cancer. There is no history of ovarian cancer in the family.   GYNECOLOGIC HISTORY:  No LMP recorded. Patient is postmenopausal.  menarche 30. The patient is GX P0. Tiffany Browning underwent total abdominal hysterectomy with bilateral salpingo-oophorectomy in 1990 for what was thought at that time might be ovarian cancer but proved to be a benign cyst. She has been on estrogen replacement until 01/16/2016 .  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 She is taking courses in advanced codeine at [employer] in is going to be "graduating" late July. Her husband Tiffany Dolores (DEVOIRY CORRIHER) works for [employer]. It's just the 2 of them plus their dog Tiffany Browning at home.     ADVANCED DIRECTIVES:  in place: Both have a living well.    HEALTH MAINTENANCE: Social History   Tobacco Use   Smoking status: Never Smoker   Smokeless tobacco: Never Used  Substance  Use Topics   Alcohol use: Yes    Comment: occ   Drug use: No     Colonoscopy:2007/ [facility]  PAP: s/p hysterectomy  Bone density:  Lipid panel:  Allergies  Allergen Reactions   Clearsil [Benzoyl Peroxide] Rash    Current Outpatient Medications  Medication Sig Dispense Refill   albuterol (PROVENTIL) (2.5 MG/3ML) 0.083% nebulizer solution Take 3 mLs (2.5 mg total) by nebulization every 6 (six) hours as needed for wheezing or shortness of breath. 150 mL 1   beclomethasone (QVAR) 80 MCG/ACT inhaler Inhale into the lungs.     cephALEXin (KEFLEX) 500 MG capsule Take 1 capsule (500 mg total) by mouth 2 (two) times daily. 10 capsule 0   docusate sodium (COLACE) 100 MG capsule Take 100 mg by mouth daily.     fish oil-omega-3 fatty acids 1000 MG capsule Take 1 g by mouth daily.       gabapentin (NEURONTIN) 300 MG capsule TAKE 1 CAPSULE (300 MG TOTAL) BY  MOUTH AT BEDTIME. 90 capsule 0   insulin aspart (NOVOLOG) 100 UNIT/ML injection Per Dr Forde Dandy (Patient taking differently: Inject 0.3-0.4 Units into the skin continuous. Insulin pump-Per Dr Forde Dandy) 3 vial PRN   levothyroxine (SYNTHROID, LEVOTHROID) 175 MCG tablet Take 175 mcg by mouth daily. Take all days except on Sunday     montelukast (SINGULAIR) 10 MG tablet Take 10 mg by mouth at bedtime.       Multiple Vitamin (MULTIVITAMIN) tablet Take 1 tablet by mouth daily.       ONE TOUCH ULTRA TEST test strip TEST 8 TIMES DAILY OR AS DIRECTED  2   PROAIR RESPICLICK 631 (90 Base) MCG/ACT AEPB      rosuvastatin (CRESTOR) 5 MG tablet Take 5 mg by mouth daily.  3   tamoxifen (NOLVADEX) 20 MG tablet TAKE 1 TABLET BY MOUTH EVERY DAY 90 tablet 0   venlafaxine XR (EFFEXOR-XR) 75 MG 24 hr capsule TAKE 2 CAPSULES BY MOUTH DAILY WITH BREAKFAST. 180 capsule 0   No current facility-administered medications for this visit.     OBJECTIVE: Middle-aged white woman who appears well There were no vitals filed for this visit.   There is no height or weight on file to calculate BMI.    ECOG FS:0 - Asymptomatic     LAB RESULTS:  CMP     Component Value Date/Time   NA 140 12/15/2017 0919   NA 140 12/15/2016 1234   K 4.4 12/15/2017 0919   K 4.8 12/15/2016 1234   CL 107 12/15/2017 0919   CO2 27 12/15/2017 0919   CO2 28 12/15/2016 1234   GLUCOSE 241 (H) 12/15/2017 0919   GLUCOSE 133 12/15/2016 1234   BUN 13 12/15/2017 0919   BUN 14.6 12/15/2016 1234   CREATININE 0.84 12/15/2017 0919   CREATININE 0.8 12/15/2016 1234   CALCIUM 8.8 12/15/2017 0919   CALCIUM 9.0 12/15/2016 1234   PROT 6.9 12/15/2017 0919   PROT 7.2 12/15/2016 1234   ALBUMIN 3.5 12/15/2017 0919   ALBUMIN 3.7 12/15/2016 1234   AST 34 12/15/2017 0919   AST 31 12/15/2016 1234   ALT 34 12/15/2017 0919   ALT 30 12/15/2016 1234   ALKPHOS 106 12/15/2017 0919   ALKPHOS 109 12/15/2016 1234   BILITOT <0.2 (L) 12/15/2017 0919   BILITOT  <0.22 12/15/2016 1234   GFRNONAA >60 12/15/2017 0919   GFRAA >60 12/15/2017 0919    INo results found for: SPEP, UPEP  Lab Results  Component Value Date  WBC 4.2 12/15/2017   NEUTROABS 2.6 12/15/2017   HGB 11.2 (L) 12/15/2017   HCT 34.5 (L) 12/15/2017   MCV 95.3 12/15/2017   PLT 280 12/15/2017      Chemistry      Component Value Date/Time   NA 140 12/15/2017 0919   NA 140 12/15/2016 1234   K 4.4 12/15/2017 0919   K 4.8 12/15/2016 1234   CL 107 12/15/2017 0919   CO2 27 12/15/2017 0919   CO2 28 12/15/2016 1234   BUN 13 12/15/2017 0919   BUN 14.6 12/15/2016 1234   CREATININE 0.84 12/15/2017 0919   CREATININE 0.8 12/15/2016 1234      Component Value Date/Time   CALCIUM 8.8 12/15/2017 0919   CALCIUM 9.0 12/15/2016 1234   ALKPHOS 106 12/15/2017 0919   ALKPHOS 109 12/15/2016 1234   AST 34 12/15/2017 0919   AST 31 12/15/2016 1234   ALT 34 12/15/2017 0919   ALT 30 12/15/2016 1234   BILITOT <0.2 (L) 12/15/2017 0919   BILITOT <0.22 12/15/2016 1234       No results found for: LABCA2  No components found for: LABCA125  No results for input(s): INR in the last 168 hours.  Urinalysis    Component Value Date/Time   COLORURINE yellow 02/06/2010 1109   APPEARANCEUR Clear 02/06/2010 1109   LABSPEC 1.015 02/06/2010 1109   PHURINE 6.5 02/06/2010 1109   HGBUR negative 02/06/2010 1109   BILIRUBINUR negative 01/20/2018 1021   BILIRUBINUR negative 03/07/2016 1150   KETONESUR negative 01/20/2018 1021   PROTEINUR negative 01/20/2018 1021   PROTEINUR negative 03/07/2016 1150   UROBILINOGEN 0.2 01/20/2018 1021   UROBILINOGEN 0.2 02/06/2010 1109   NITRITE Negative 01/20/2018 1021   NITRITE negative 03/07/2016 1150   NITRITE negative 02/06/2010 1109   LEUKOCYTESUR Negative 01/20/2018 1021     STUDIES: Her next mammogram is due mid June 2020; order has been placed  ELIGIBLE FOR AVAILABLE RESEARCH PROTOCOL: [place]  ASSESSMENT: 63 y.o. [place], [place] woman status post right  breast upper outer quadrant biopsy 01/09/2016 for a clinical T2 N0, stage IIA invasive ductal carcinoma, grade 2, estrogen and progesterone receptor positive, HER-2 nonamplified, with an MIB-1 of 30%.   (a) biopsy of a second upper outer quadrant area of concern 02/14/2016 was benign  (1) neoadjuvant tamoxifen started 01/28/2016  (2) right lumpectomy and sentinel lymph node sampling 03/26/2016 showed a pT2 pN0, stage IIA invasive ductal carcinoma, grade 2, with negative margins  (3) Oncotype DX score of 11 predicts a 10 year risk of recurrence outside the breast of 8% if the patient's only systemic therapy is tamoxifen for 5 years. It also predicted no benefit from chemotherapy  (4) adjuvant radiation completed 06/08/2016  (5) tamoxifen continued through local treatment--never interrupted   (6). Genetics testing of the patient's nephew (kidney cancer at age 14) has been recommended  PLAN:  Tiffany Browning is now just about 3 years out from definitive surgery for her breast cancer with no evidence of disease recurrence.  This is very favorable.  She is tolerating tamoxifen well and the plan will be to continue that for total of 5 years.  She is exercising and taking appropriate pandemic precautions  Next mammogram will be due in June.  I have placed the order.  I offered her a visit in August but if everything is fine at that time we can simply postpone that until May 2021  She knows to call for any other issue that may develop before the next  visit here.   Chauncey Cruel, MD   12/19/2018 10:13 AM Medical Oncology and Hematology [facility] [address] [place], [place] [address] Tel. [phone]    Fax. [phone]   I, Wilburn Mylar, am acting as scribe for Dr. [name]. Indianna Boran.  I, Lurline Del MD, have reviewed the above documentation for accuracy and completeness, and I agree with the above.

## 2018-12-19 ENCOUNTER — Other Ambulatory Visit: Payer: 59

## 2018-12-19 ENCOUNTER — Inpatient Hospital Stay: Payer: 59 | Attending: Oncology | Admitting: Oncology

## 2018-12-19 DIAGNOSIS — C50411 Malignant neoplasm of upper-outer quadrant of right female breast: Secondary | ICD-10-CM | POA: Diagnosis not present

## 2018-12-19 DIAGNOSIS — Z794 Long term (current) use of insulin: Secondary | ICD-10-CM | POA: Diagnosis not present

## 2018-12-19 DIAGNOSIS — Z923 Personal history of irradiation: Secondary | ICD-10-CM | POA: Diagnosis not present

## 2018-12-19 DIAGNOSIS — E039 Hypothyroidism, unspecified: Secondary | ICD-10-CM

## 2018-12-19 DIAGNOSIS — Z79899 Other long term (current) drug therapy: Secondary | ICD-10-CM

## 2018-12-19 DIAGNOSIS — E109 Type 1 diabetes mellitus without complications: Secondary | ICD-10-CM | POA: Diagnosis not present

## 2018-12-19 DIAGNOSIS — E1040 Type 1 diabetes mellitus with diabetic neuropathy, unspecified: Secondary | ICD-10-CM | POA: Diagnosis not present

## 2018-12-19 DIAGNOSIS — Z4681 Encounter for fitting and adjustment of insulin pump: Secondary | ICD-10-CM | POA: Diagnosis not present

## 2018-12-20 ENCOUNTER — Telehealth: Payer: Self-pay | Admitting: Oncology

## 2018-12-20 NOTE — Telephone Encounter (Signed)
Called regarding schedule °

## 2019-01-07 ENCOUNTER — Other Ambulatory Visit: Payer: Self-pay | Admitting: Oncology

## 2019-01-14 ENCOUNTER — Other Ambulatory Visit: Payer: Self-pay | Admitting: Oncology

## 2019-01-18 ENCOUNTER — Other Ambulatory Visit: Payer: Self-pay | Admitting: Orthopedic Surgery

## 2019-02-01 ENCOUNTER — Telehealth: Payer: Self-pay | Admitting: *Deleted

## 2019-02-01 NOTE — Telephone Encounter (Signed)
This RN returned VM left by pt requesting discussion per pending carpal tunnel surgery on arm that also had lymph nodes removed.  She is concerned due to being informed by hand surgeon that surgery includes use of tourniquet and wrapping tightly.  Surgery is scheduled for 02/09/2019.   This RN returned call to given number of [phone].  Obtained identified VM.  Message left requesting a return call to this RN.

## 2019-02-02 ENCOUNTER — Encounter (HOSPITAL_BASED_OUTPATIENT_CLINIC_OR_DEPARTMENT_OTHER): Payer: Self-pay | Admitting: *Deleted

## 2019-02-02 ENCOUNTER — Other Ambulatory Visit: Payer: Self-pay

## 2019-02-02 ENCOUNTER — Encounter (HOSPITAL_BASED_OUTPATIENT_CLINIC_OR_DEPARTMENT_OTHER)
Admission: RE | Admit: 2019-02-02 | Discharge: 2019-02-02 | Disposition: A | Discharge status: Home / Self Care | Payer: 59 | Source: Ambulatory Visit | Attending: Orthopedic Surgery | Admitting: Orthopedic Surgery

## 2019-02-02 DIAGNOSIS — Z01818 Encounter for other preprocedural examination: Secondary | ICD-10-CM | POA: Diagnosis not present

## 2019-02-02 DIAGNOSIS — E119 Type 2 diabetes mellitus without complications: Secondary | ICD-10-CM | POA: Insufficient documentation

## 2019-02-02 LAB — BASIC METABOLIC PANEL
Anion gap: 8 (ref 5–15)
BUN: 13 mg/dL (ref 8–23)
CO2: 28 mmol/L (ref 22–32)
Calcium: 9.2 mg/dL (ref 8.9–10.3)
Chloride: 103 mmol/L (ref 98–111)
Creatinine, Ser: 0.75 mg/dL (ref 0.44–1.00)
GFR calc Af Amer: >60 mL/min (ref >60)
GFR calc non Af Amer: >60 mL/min (ref >60)
Glucose, Bld: 238 mg/dL — ABNORMAL HIGH (ref 70–99)
Potassium: 5.3 mmol/L — ABNORMAL HIGH (ref 3.5–5.1)
Sodium: 139 mmol/L (ref 135–145)

## 2019-02-02 NOTE — Progress Notes (Signed)
Gatorade 2 drink given with instructions to complete by [place], pt verbalized understanding.

## 2019-02-06 ENCOUNTER — Other Ambulatory Visit (HOSPITAL_COMMUNITY)
Admission: RE | Admit: 2019-02-06 | Discharge: 2019-02-06 | Disposition: A | Discharge status: Home / Self Care | Payer: 59 | Source: Ambulatory Visit | Attending: Orthopedic Surgery | Admitting: Orthopedic Surgery

## 2019-02-06 DIAGNOSIS — Z1159 Encounter for screening for other viral diseases: Secondary | ICD-10-CM | POA: Insufficient documentation

## 2019-02-06 LAB — SARS CORONAVIRUS 2 (TAT 6-24 HRS): SARS Coronavirus 2: NEGATIVE

## 2019-02-09 ENCOUNTER — Encounter (HOSPITAL_BASED_OUTPATIENT_CLINIC_OR_DEPARTMENT_OTHER): Payer: Self-pay | Admitting: Anesthesiology

## 2019-02-09 ENCOUNTER — Encounter (HOSPITAL_BASED_OUTPATIENT_CLINIC_OR_DEPARTMENT_OTHER)
Admission: RE | Disposition: A | Discharge status: Home / Self Care | Payer: Self-pay | Source: Ambulatory Visit | Attending: Orthopedic Surgery

## 2019-02-09 ENCOUNTER — Ambulatory Visit (HOSPITAL_BASED_OUTPATIENT_CLINIC_OR_DEPARTMENT_OTHER)
Admission: RE | Admit: 2019-02-09 | Discharge: 2019-02-09 | Disposition: A | Discharge status: Home / Self Care | Payer: 59 | Source: Ambulatory Visit | Attending: Orthopedic Surgery | Admitting: Orthopedic Surgery

## 2019-02-09 ENCOUNTER — Ambulatory Visit (HOSPITAL_BASED_OUTPATIENT_CLINIC_OR_DEPARTMENT_OTHER): Payer: 59 | Admitting: Anesthesiology

## 2019-02-09 ENCOUNTER — Other Ambulatory Visit: Payer: Self-pay

## 2019-02-09 DIAGNOSIS — Z853 Personal history of malignant neoplasm of breast: Secondary | ICD-10-CM | POA: Diagnosis not present

## 2019-02-09 DIAGNOSIS — Z803 Family history of malignant neoplasm of breast: Secondary | ICD-10-CM | POA: Diagnosis not present

## 2019-02-09 DIAGNOSIS — L237 Allergic contact dermatitis due to plants, except food: Secondary | ICD-10-CM | POA: Insufficient documentation

## 2019-02-09 DIAGNOSIS — Z923 Personal history of irradiation: Secondary | ICD-10-CM | POA: Diagnosis not present

## 2019-02-09 DIAGNOSIS — E039 Hypothyroidism, unspecified: Secondary | ICD-10-CM | POA: Diagnosis not present

## 2019-02-09 DIAGNOSIS — Z9011 Acquired absence of right breast and nipple: Secondary | ICD-10-CM | POA: Insufficient documentation

## 2019-02-09 DIAGNOSIS — E109 Type 1 diabetes mellitus without complications: Secondary | ICD-10-CM | POA: Insufficient documentation

## 2019-02-09 DIAGNOSIS — G5603 Carpal tunnel syndrome, bilateral upper limbs: Secondary | ICD-10-CM | POA: Diagnosis not present

## 2019-02-09 DIAGNOSIS — J45909 Unspecified asthma, uncomplicated: Secondary | ICD-10-CM | POA: Diagnosis not present

## 2019-02-09 HISTORY — PX: CARPAL TUNNEL RELEASE: SHX101

## 2019-02-09 LAB — GLUCOSE, CAPILLARY
Glucose-Capillary: 201 mg/dL — ABNORMAL HIGH (ref 70–99)
Glucose-Capillary: 218 mg/dL — ABNORMAL HIGH (ref 70–99)

## 2019-02-09 SURGERY — CARPAL TUNNEL RELEASE
Anesthesia: Monitor Anesthesia Care | Site: Wrist | Laterality: Right

## 2019-02-09 MED ORDER — MEPERIDINE HCL 25 MG/ML IJ SOLN: 6.2500 mg | INTRAMUSCULAR | Status: DC | PRN

## 2019-02-09 MED ORDER — LIDOCAINE 2% (20 MG/ML) 5 ML SYRINGE
INTRAMUSCULAR | Status: AC
  Filled 2019-02-09: qty 5

## 2019-02-09 MED ORDER — LACTATED RINGERS IV SOLN
INTRAVENOUS | Status: DC
  Administered 2019-02-09: 09:00:00 via INTRAVENOUS

## 2019-02-09 MED ORDER — TRAMADOL HCL 50 MG PO TABS: 50.0000 mg | ORAL_TABLET | Freq: Four times a day (QID) | ORAL | 0 refills | Status: DC | PRN

## 2019-02-09 MED ORDER — FENTANYL CITRATE (PF) 100 MCG/2ML IJ SOLN
INTRAMUSCULAR | Status: AC
  Filled 2019-02-09: qty 2

## 2019-02-09 MED ORDER — MIDAZOLAM HCL 2 MG/2ML IJ SOLN
INTRAMUSCULAR | Status: AC
  Filled 2019-02-09: qty 2

## 2019-02-09 MED ORDER — CEFAZOLIN SODIUM-DEXTROSE 2-4 GM/100ML-% IV SOLN
INTRAVENOUS | Status: AC
  Filled 2019-02-09: qty 100

## 2019-02-09 MED ORDER — OXYCODONE HCL 5 MG/5ML PO SOLN: 5.0000 mg | Freq: Once | ORAL | Status: DC | PRN

## 2019-02-09 MED ORDER — MIDAZOLAM HCL 2 MG/2ML IJ SOLN
1.0000 mg | INTRAMUSCULAR | Status: DC | PRN
  Administered 2019-02-09: 2 mg via INTRAVENOUS

## 2019-02-09 MED ORDER — LIDOCAINE HCL (PF) 0.5 % IJ SOLN
INTRAMUSCULAR | Status: DC | PRN
  Administered 2019-02-09: 30 mL via INTRAVENOUS

## 2019-02-09 MED ORDER — PROPOFOL 500 MG/50ML IV EMUL
INTRAVENOUS | Status: DC | PRN
  Administered 2019-02-09: 50 ug/kg/min via INTRAVENOUS

## 2019-02-09 MED ORDER — FENTANYL CITRATE (PF) 100 MCG/2ML IJ SOLN
50.0000 ug | INTRAMUSCULAR | Status: DC | PRN
  Administered 2019-02-09: 09:00:00 50 ug via INTRAVENOUS

## 2019-02-09 MED ORDER — ONDANSETRON HCL 4 MG/2ML IJ SOLN
INTRAMUSCULAR | Status: DC | PRN
  Administered 2019-02-09: 4 mg via INTRAVENOUS

## 2019-02-09 MED ORDER — METOCLOPRAMIDE HCL 5 MG/ML IJ SOLN: 10.0000 mg | Freq: Once | INTRAMUSCULAR | Status: DC | PRN

## 2019-02-09 MED ORDER — BUPIVACAINE HCL (PF) 0.25 % IJ SOLN
INTRAMUSCULAR | Status: DC | PRN
  Administered 2019-02-09: 9 mL

## 2019-02-09 MED ORDER — CEFAZOLIN SODIUM-DEXTROSE 2-4 GM/100ML-% IV SOLN: 2.0000 g | INTRAVENOUS | Status: DC

## 2019-02-09 MED ORDER — CHLORHEXIDINE GLUCONATE 4 % EX LIQD: 60.0000 mL | Freq: Once | CUTANEOUS | Status: DC

## 2019-02-09 MED ORDER — OXYCODONE HCL 5 MG PO TABS: 5.0000 mg | ORAL_TABLET | Freq: Once | ORAL | Status: DC | PRN

## 2019-02-09 SURGICAL SUPPLY — 40 items
APL PRP STRL LF DISP 70% ISPRP (MISCELLANEOUS) ×1
BLADE SURG 15 STRL LF DISP TIS (BLADE) ×1 IMPLANT
BLADE SURG 15 STRL SS (BLADE) ×2
BNDG CMPR 9X4 STRL LF SNTH (GAUZE/BANDAGES/DRESSINGS) ×1
BNDG COHESIVE 3X5 TAN STRL LF (GAUZE/BANDAGES/DRESSINGS) ×2 IMPLANT
BNDG ESMARK 4X9 LF (GAUZE/BANDAGES/DRESSINGS) ×1 IMPLANT
BNDG GAUZE ELAST 4 BULKY (GAUZE/BANDAGES/DRESSINGS) ×2 IMPLANT
CHLORAPREP W/TINT 26 (MISCELLANEOUS) ×2 IMPLANT
CORD BIPOLAR FORCEPS 12FT (ELECTRODE) ×2 IMPLANT
COVER BACK TABLE REUSABLE LG (DRAPES) ×2 IMPLANT
COVER MAYO STAND REUSABLE (DRAPES) ×2 IMPLANT
COVER WAND RF STERILE (DRAPES) IMPLANT
CUFF TOURN SGL QUICK 18X4 (TOURNIQUET CUFF) ×2 IMPLANT
DRAPE EXTREMITY T 121X128X90 (DISPOSABLE) ×2 IMPLANT
DRAPE SURG 17X23 STRL (DRAPES) ×2 IMPLANT
DRSG PAD ABDOMINAL 8X10 ST (GAUZE/BANDAGES/DRESSINGS) ×2 IMPLANT
GAUZE SPONGE 4X4 12PLY STRL (GAUZE/BANDAGES/DRESSINGS) ×2 IMPLANT
GAUZE XEROFORM 1X8 LF (GAUZE/BANDAGES/DRESSINGS) ×2 IMPLANT
GLOVE BIO SURGEON STRL SZ7 (GLOVE) ×1 IMPLANT
GLOVE BIOGEL PI IND STRL 7.0 (GLOVE) IMPLANT
GLOVE BIOGEL PI IND STRL 7.5 (GLOVE) IMPLANT
GLOVE BIOGEL PI IND STRL 8.5 (GLOVE) ×1 IMPLANT
GLOVE BIOGEL PI INDICATOR 7.0 (GLOVE) ×1
GLOVE BIOGEL PI INDICATOR 7.5 (GLOVE) ×1
GLOVE BIOGEL PI INDICATOR 8.5 (GLOVE) ×1
GLOVE SURG ORTHO 8.0 STRL STRW (GLOVE) ×2 IMPLANT
GOWN STRL REUS W/ TWL LRG LVL3 (GOWN DISPOSABLE) ×1 IMPLANT
GOWN STRL REUS W/TWL LRG LVL3 (GOWN DISPOSABLE) ×2
GOWN STRL REUS W/TWL XL LVL3 (GOWN DISPOSABLE) ×2 IMPLANT
NDL PRECISIONGLIDE 27X1.5 (NEEDLE) IMPLANT
NEEDLE PRECISIONGLIDE 27X1.5 (NEEDLE) ×2 IMPLANT
NS IRRIG 1000ML POUR BTL (IV SOLUTION) ×2 IMPLANT
PACK BASIN DAY SURGERY FS (CUSTOM PROCEDURE TRAY) ×2 IMPLANT
STOCKINETTE 4X48 STRL (DRAPES) ×2 IMPLANT
SUT ETHILON 4 0 PS 2 18 (SUTURE) ×2 IMPLANT
SUT VICRYL 4-0 PS2 18IN ABS (SUTURE) IMPLANT
SYR BULB 3OZ (MISCELLANEOUS) ×2 IMPLANT
SYR CONTROL 10ML LL (SYRINGE) ×1 IMPLANT
TOWEL GREEN STERILE FF (TOWEL DISPOSABLE) ×2 IMPLANT
UNDERPAD 30X30 (UNDERPADS AND DIAPERS) ×2 IMPLANT

## 2019-02-09 NOTE — Anesthesia Postprocedure Evaluation (Signed)
Anesthesia Post Note  Patient: Tiffany Browning  Procedure(s) Performed: RIGHT CARPAL TUNNEL RELEASE (Right Wrist)     Patient location during evaluation: PACU Anesthesia Type: MAC Level of consciousness: awake and alert and oriented Pain management: pain level controlled Vital Signs Assessment: post-procedure vital signs reviewed and stable Respiratory status: spontaneous breathing, nonlabored ventilation and respiratory function stable Cardiovascular status: stable and blood pressure returned to baseline Postop Assessment: no apparent nausea or vomiting Anesthetic complications: no    Last Vitals:  Vitals:   02/09/19 0937 02/09/19 0945  BP: 121/64 (!) 111/52  Pulse: (!) 59 (!) 59  Resp: 14 13  Temp: 36.6 C   SpO2: 100% 99%    Last Pain:  Vitals:   02/09/19 0937  TempSrc:   PainSc: 0-No pain                 [name] A.

## 2019-02-09 NOTE — Brief Op Note (Signed)
02/09/2019  9:34 AM  PATIENT:  Tiffany Browning  63 y.o. female  PRE-OPERATIVE DIAGNOSIS:  RIGHT CARPAL TUNNEL SYNDROME  POST-OPERATIVE DIAGNOSIS:  RIGHT CARPAL TUNNEL SYNDROME  PROCEDURE:  Procedure(s): RIGHT CARPAL TUNNEL RELEASE (Right)  SURGEON:  Surgeon(s) and Role:    * Daryll Brod, MD - Primary  PHYSICIAN ASSISTANT:   ASSISTANTS: none   ANESTHESIA:   local, regional and IV sedation  EBL: 6ml  BLOOD ADMINISTERED:none  DRAINS: none   LOCAL MEDICATIONS USED:  BUPIVICAINE   SPECIMEN:  No Specimen  DISPOSITION OF SPECIMEN:  N/A  COUNTS:  YES  TOURNIQUET:   Total Tourniquet Time Documented: Forearm (Right) - 18 minutes Total: Forearm (Right) - 18 minutes   DICTATION: .Dragon Dictation  PLAN OF CARE: Discharge to home after PACU  PATIENT DISPOSITION:  PACU - hemodynamically stable.

## 2019-02-09 NOTE — Transfer of Care (Signed)
Immediate Anesthesia Transfer of Care Note  Patient: Tiffany Browning  Procedure(s) Performed: RIGHT CARPAL TUNNEL RELEASE (Right Wrist)  Patient Location: PACU  Anesthesia Type:Bier block  Level of Consciousness: awake, alert  and oriented  Airway & Oxygen Therapy: Patient Spontanous Breathing and Patient connected to nasal cannula oxygen  Post-op Assessment: Report given to RN and Post -op Vital signs reviewed and stable  Post vital signs: Reviewed and stable  Last Vitals:  Vitals Value Taken Time  BP 146/58 02/09/19 1030  Temp 36.6 C 02/09/19 1030  Pulse 55 02/09/19 1030  Resp 20 02/09/19 1030  SpO2 100 % 02/09/19 1030    Last Pain:  Vitals:   02/09/19 1015  TempSrc:   PainSc: 0-No pain      Patients Stated Pain Goal: 4 (75/64/33 2951)  Complications: No apparent anesthesia complications

## 2019-02-09 NOTE — H&P (Signed)
Tiffany Browning is an 63 y.o. female.   Chief Complaint: numbness hands HPI: Tiffany Browning is a 63 year old right-hand-dominant female referred by Dr. Haynes Browning for consultation regarding numbness in her right hand thumb through ring fingers. She is complaining only minimal discomfort. He has a history of bilateral wrist fractures. She has no history of injury to her neck. Her symptoms have been going on for slightly over a month. She has tried taking Aleve without relief. She has had a trigger finger release by Dr. Amedeo Plenty on her left ring finger and is complaining of occasional numbness to that finger. She has a history of diabetes thyroid problems no history of arthritis or gout. Family history is positive arthritis negative for the remainder. Nerve conductions have been done by Dr. Tamsen Roers revealing bilateral carpal tunnel syndrome with a motor delay of 8.8 on the right side 5.7 on the left with no response on the right and a 3.8 response on the left.   Past Medical History:  Diagnosis Date  . Allergy   . Asthma   . Breast cancer ([place])   . Diabetes mellitus    Type 1  . Family history of breast cancer   . Family history of kidney cancer   . Heart murmur   . Hypothyroidism   . Personal history of radiation therapy   . Thyroid disease    Hypothyroidism    Past Surgical History:  Procedure Laterality Date  . APPENDECTOMY    . BREAST BIOPSY Right 02/14/2016  . BREAST BIOPSY Right 01/09/2016  . BREAST LUMPECTOMY Right 03/26/2016  . BREAST LUMPECTOMY WITH AXILLARY LYMPH NODE BIOPSY Right 03/26/2016   Procedure: RIGHT BREAST LUMPECTOMY WITH AXILLARY LYMPH NODE BIOPSY;  Surgeon: Autumn Messing III, MD;  Location: [facility];  Service: General;  Laterality: Right;  . NASAL SINUS SURGERY    . ruptured ovarian cyst    . SHOULDER ARTHROSCOPY  2004   b/L --second one 2011  . TONSILECTOMY, ADENOIDECTOMY, BILATERAL MYRINGOTOMY AND TUBES    . TUBAL LIGATION     then reversed it    Family History  Problem  Relation Age of Onset  . Cancer Mother        cervical  . Hypertension Mother   . Hyperlipidemia Mother   . Heart disease Father 62       MI---- cabg before that  . Hypothyroidism Sister   . Breast cancer Sister 54  . Heart disease Brother 63       MI--- second one 72  . Hyperlipidemia Brother   . Hypertension Brother   . Hypothyroidism Sister   . Kidney cancer Other 35       son of her sister with breast cancer   Social History:  reports that she has never smoked. She has never used smokeless tobacco. She reports current alcohol use. She reports that she does not use drugs.  Allergies:  Allergies  Allergen Reactions  . Clearsil [Benzoyl Peroxide] Rash    No medications prior to admission.    No results found for this or any previous visit (from the past 48 hour(s)).  No results found.   Pertinent items are noted in HPI.  Height 5' 1.25" (1.556 m), weight 59 kg.  General appearance: alert, cooperative and appears stated age Head: Normocephalic, without obvious abnormality Neck: no JVD Resp: clear to auscultation bilaterally Cardio: regular rate and rhythm, S1, S2 normal, no murmur, click, rub or gallop GI: soft, non-tender; bowel sounds normal; no masses,  no organomegaly Extremities:numbness right hand Pulses: 2+ and symmetric Skin: Skin color, texture, turgor normal. No rashes or lesions Neurologic: Grossly normal Incision/Wound: na  Assessment/Plan Assessment:  1. Bilateral carpal tunnel syndrome    Plan: We have discussed her nerve conductions with her. She would like to proceed to have this surgically released on her right side. Pre-peri-and postoperative course been discussed along with risks and complications. She is aware there is no guarantee to the surgery the possibility of infection recurrence injury to arteries nerves tendons complete relief symptoms and dystrophy. She is scheduled for right carpal tunnel release in outpatient under regional  anesthesia.     Daryll Brod 02/09/2019, 4:56 AM

## 2019-02-09 NOTE — Discharge Instructions (Addendum)

## 2019-02-09 NOTE — Anesthesia Procedure Notes (Signed)
Anesthesia Regional Block: Bier block (IV Regional)   Pre-Anesthetic Checklist: ,, timeout performed, Correct Patient, Correct Site, Correct Laterality, Correct Procedure,, site marked, surgical consent,, at surgeon's request  Laterality: Right     Needles:  Injection technique: Single-shot  Needle Type: Other      Needle Gauge: 22     Additional Needles:   Procedures:,,,,, intact distal pulses, Esmarch exsanguination, single tourniquet utilized,  Narrative:   Performed by: Personally       

## 2019-02-09 NOTE — Progress Notes (Signed)
Pt has insulin pump. Pt. Glucose is 201. Dr foster alerted and spoke with pt. Told pt. To dose herself with pump as she would typically. Pt took .7 units of novolog at this time.

## 2019-02-09 NOTE — Anesthesia Preprocedure Evaluation (Addendum)
Anesthesia Evaluation  Patient identified by MRN, date of birth, ID band Patient awake    Reviewed: Allergy & Precautions, NPO status , Patient's Chart, lab work & pertinent test results  Airway Mallampati: II  TM Distance: >3 FB Neck ROM: Full    Dental no notable dental hx. (+) Teeth Intact   Pulmonary asthma ,    Pulmonary exam normal breath sounds clear to auscultation       Cardiovascular Normal cardiovascular exam+ Valvular Problems/Murmurs  Rhythm:Regular Rate:Normal     Neuro/Psych Right CTS negative psych ROS   GI/Hepatic negative GI ROS, Neg liver ROS,   Endo/Other  diabetes, Well Controlled, Type 1, Insulin DependentHypothyroidism Hyperlipidemia Hx/o Right Breast Ca  Renal/GU negative Renal ROS  negative genitourinary   Musculoskeletal negative musculoskeletal ROS (+)   Abdominal   Peds  Hematology  (+) anemia ,   Anesthesia Other Findings Poison ivy rash on operative limb.  Reproductive/Obstetrics                           Anesthesia Physical Anesthesia Plan  ASA: II  Anesthesia Plan: Bier Block and MAC and Bier Block-LIDOCAINE ONLY   Post-op Pain Management:    Induction: Intravenous  PONV Risk Score and Plan: 2 and Ondansetron, Propofol infusion, Treatment may vary due to age or medical condition and Midazolam  Airway Management Planned: Natural Airway, Nasal Cannula and Simple Face Mask  Additional Equipment:   Intra-op Plan:   Post-operative Plan:   Informed Consent: I have reviewed the patients History and Physical, chart, labs and discussed the procedure including the risks, benefits and alternatives for the proposed anesthesia with the patient or authorized representative who has indicated his/her understanding and acceptance.     Dental advisory given  Plan Discussed with: CRNA and Surgeon  Anesthesia Plan Comments:         Anesthesia Quick  Evaluation

## 2019-02-09 NOTE — Op Note (Signed)
NAME: [place] RECORD NO: [id number] DATE OF BIRTH: 09/22/1955 FACILITY: [facility] LOCATION: [facility] PHYSICIAN: Wynonia Sours, MD   OPERATIVE REPORT   DATE OF PROCEDURE: 02/09/19    PREOPERATIVE DIAGNOSIS:   Carpal tunnel syndrome right hand   POSTOPERATIVE DIAGNOSIS:   Same   PROCEDURE:   Decompression median nerve right hand   SURGEON: Daryll Brod, M.D.   ASSISTANT: none   ANESTHESIA:  Bier block with sedation and Local   INTRAVENOUS FLUIDS:  Per anesthesia flow sheet.   ESTIMATED BLOOD LOSS:  Minimal.   COMPLICATIONS:  None.   SPECIMENS:  none   TOURNIQUET TIME:    Total Tourniquet Time Documented: Forearm (Right) - 18 minutes Total: Forearm (Right) - 18 minutes    DISPOSITION:  Stable to PACU.   INDICATIONS: Patient is a 63 year old female with a history of numbness and tingling bilateral hands.  Nerve conductions are positive for carpal tunnel syndrome.  This not responded to conservative treatment she is elected to undergo surgical release and decompression of the median nerve right hand.  Pre-peri-and postoperative course were discussed along with risks and complications.  She is aware there is no guarantee to the surgery the possibility of infection recurrence injury to arteries nerves tendons complete relief of symptoms and dystrophy.  In preoperative area the patient is seen extremity marked by both patient and surgeon antibiotic given due to areas of poison oak proximally.  OPERATIVE COURSE: Patient brought to the operating room where form based IV regional anesthetic was carried out without difficulty.  She was prepped using ChloraPrep and in supine position with the right arm free.  A three-minute dry time was allowed and a timeout taken confirming patient procedure.  A longitudinal incision was made in the right palm carried down through separate subcutaneous tissue.  Bleeders were electrocauterized with palm bipolar.  The  palmar fascia was split.  The superficial palmar arch was identified along with the flexor tendon at the ring little finger.  Retractors were placed retracting the tendons median nerve radially ulnar nerve ulnarly.  Flexor retinaculum was then released on its ulnar aspect.  A right angle and stool retractor placed between skin and forearm fascia the deep structures dissected free with blunt dissection.  The proximal aspect of the flexor retinaculum distal forearm fascia was then released under direct vision for approximately 2 cm proximal to the wrist crease under direct vision.  The canal was explored.  An area compression of the nerve is apparent.  The motor branch took off ulnarly but was intact.  The wound was copiously irrigated with saline.  Skin was closed erupted 4-0 nylon sutures.  Local infiltration quarter percent bupivacaine with without epinephrine was given prior.  Approximately 8 8 to 9 cc was used.  Sterile compressive dressing with fingers 3 was applied.  Deflation of the tourniquet all fingers immediately pink.  She was taken to the recovery room for observation in satisfactory condition.  She will be discharged home to return to the hand center [facility] in 1 week on Tylenol ibuprofen with Ultram as a backup.   Daryll Brod, MD Electronically signed, 02/09/19

## 2019-02-10 ENCOUNTER — Encounter (HOSPITAL_BASED_OUTPATIENT_CLINIC_OR_DEPARTMENT_OTHER): Payer: Self-pay | Admitting: Orthopedic Surgery

## 2019-02-13 ENCOUNTER — Other Ambulatory Visit: Payer: Self-pay

## 2019-02-13 ENCOUNTER — Other Ambulatory Visit: Payer: Self-pay | Admitting: Oncology

## 2019-02-13 ENCOUNTER — Ambulatory Visit
Admission: RE | Admit: 2019-02-13 | Discharge: 2019-02-13 | Disposition: A | Discharge status: Home / Self Care | Payer: 59 | Source: Ambulatory Visit | Attending: Oncology | Admitting: Oncology

## 2019-02-13 DIAGNOSIS — C50411 Malignant neoplasm of upper-outer quadrant of right female breast: Secondary | ICD-10-CM

## 2019-03-01 ENCOUNTER — Telehealth: Payer: Self-pay | Admitting: Internal Medicine

## 2019-03-01 DIAGNOSIS — G5603 Carpal tunnel syndrome, bilateral upper limbs: Secondary | ICD-10-CM | POA: Insufficient documentation

## 2019-03-01 NOTE — Telephone Encounter (Signed)
ok 

## 2019-03-02 ENCOUNTER — Telehealth: Payer: Self-pay

## 2019-03-02 NOTE — Telephone Encounter (Signed)
Please advise 

## 2019-03-02 NOTE — Telephone Encounter (Signed)
Appt scheduled

## 2019-03-02 NOTE — Telephone Encounter (Signed)
Okay to schedule NP appt w/ Lowne.

## 2019-03-02 NOTE — Telephone Encounter (Signed)
yes

## 2019-03-02 NOTE — Telephone Encounter (Signed)
Copied from [place] [id number]. Topic: Appointment Scheduling - Scheduling Inquiry for Clinic >> Mar 02, 2019  8:30 AM Richardo Priest, NT wrote: Reason for CRM: Patient requesting a new patient appointment and physical with Dr.Lowne Chase. Please advise and call back. [phone] and it is okay to LVM.

## 2019-03-10 ENCOUNTER — Telehealth: Payer: Self-pay

## 2019-03-10 ENCOUNTER — Other Ambulatory Visit: Payer: Self-pay

## 2019-03-10 ENCOUNTER — Telehealth: Payer: Self-pay | Admitting: *Deleted

## 2019-03-10 DIAGNOSIS — Z789 Other specified health status: Secondary | ICD-10-CM

## 2019-03-10 NOTE — Telephone Encounter (Signed)
Copied from [place] [id number]. Topic: General - Other >> Mar 10, 2019  3:07 PM Tiffany Browning wrote: Reason for CRM: Pt wanting to know how often her hepatitis b vaccine should be administered. Please advise.

## 2019-03-10 NOTE — Telephone Encounter (Signed)
If she had the series of 3 she is done

## 2019-03-10 NOTE — Telephone Encounter (Signed)
Left detailed message on pt's voicemail that she should be able to access this record through her mychart account under the health summary then immunizations. Advised pt to call us back if she is not able to access via mychart and we could fax or mail the report to her.  Copied from [place] [id number]. Topic: General - Other >> Mar 09, 2019  3:39 PM Leward Quan A wrote: Reason for CRM: Patient called to request a copy of her last Tdap vaccination done on 11/21/2013. Asking for it to be uploaded to My Chart and also to mail a copy to her home address. Can call if any questions. Ph# [phone]

## 2019-03-13 ENCOUNTER — Other Ambulatory Visit: Payer: Self-pay

## 2019-03-13 ENCOUNTER — Other Ambulatory Visit (INDEPENDENT_AMBULATORY_CARE_PROVIDER_SITE_OTHER): Payer: 59

## 2019-03-13 DIAGNOSIS — Z789 Other specified health status: Secondary | ICD-10-CM | POA: Diagnosis not present

## 2019-03-13 NOTE — Telephone Encounter (Signed)
Spoke with patient, she states she didn't remember if she had the shots. She is not in [place]. I set patient up for appointment for lab to have titer drawn.

## 2019-03-13 NOTE — Addendum Note (Signed)
Addended by: Wynonia Musty A on: 03/13/2019 07:21 AM   Modules accepted: Orders

## 2019-03-14 LAB — HEPATITIS B CORE ANTIBODY, IGM: Hep B C IgM: NONREACTIVE

## 2019-03-28 NOTE — Progress Notes (Signed)
Tiffany Browning  [phone] [phone]) [phone]     ID: Tiffany Browning DOB: 1962-07-06  MR#: [id number]  RAX#:[id number]  Patient Care Team: Crist Infante, MD as PCP - General (Internal Medicine) Reynold Bowen, MD as Consulting Physician (Endocrinology) [name], MD as Consulting Physician (General Surgery) Shalaunda Weatherholtz, [name], MD as Consulting Physician (Oncology) [name], MD as Attending Physician (Radiation Oncology) [name], NP as Nurse Practitioner (Hematology and Oncology) OTHER MD:   CHIEF COMPLAINT: Estrogen receptor positive breast cancer  CURRENT TREATMENT:  tamoxifen   INTERVAL HISTORY: [name] returns today for follow-up and treatment of her estrogen receptor positive breast cancer.  She continues on tamoxifen. She has some mild hot flashes. She denies vaginal dryness.  Since her last visit here, she underwent a digital diagnostic bilateral mammogram with tomography on 02/13/2019 showing: Breast Density Category C. There is no mammographic evidence for malignancy.     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 She does get out for exercise, but she does get out as much as she wants to. The patient denies unusual headaches, visual changes, nausea, vomiting, or dizziness. There has been no unusual cough, phlegm production, or pleurisy. This been no change in bowel or bladder habits. The patient denies unexplained fatigue or unexplained weight loss, bleeding, rash, or fever. A detailed review of systems was otherwise noncontributory.    BREAST CANCER HISTORY: From the original intake note:  Tiffany Browning had routine physical exam by her gynecologist in May 2017 and she was able to palpate a mass in the upper-outer quadrant of her right breast. The patient had had bilateral screening mammography 06/26/2015 at the [facility], with no suspicious findings. On 12/31/2015 she underwent bilateral  diagnostic mammography with tomography and right breast ultrasonography. The breast density was category D. On focal spot compression in the area of palpable concern there was a possible mass, hard to make out because of the breast density. On exam there was a firm palpable lump measuring approximately 3 cm at the 9:00 region of the right breast, 4 cm from the nipple. The overlying skin was unremarkable. Ultrasonography confirmed an irregular hypoechoic mass in the area in question measuring 3.0 cm.  Also, separate from the palpable mass at the 9:00 position but 2 cm from the nipple was a hypoechoic oval mass measuring 2.0 cm. Ultrasound of the right axilla was negative.  Biopsy of both the masses in question from the right rest were obtained 01/09/2016. This showed (SAA [id number]) the mass closer to the nipple, S2 centimeters, to be a fibroadenoma. The more distant mass, 4 cm from the nipple, was invasive ductal carcinoma, E-cadherin positive, grade 2, estrogen receptor 100% positive, progesterone receptor 100% positive, with an MIB-1 of 30%, and no HER-2 amplification, the signals ratio being 1.43 and the number per cell 2.15.  The patient's subsequent history is as detailed above.     [name] PAST MEDICAL HISTORY: Past Medical History:  Diagnosis Date  . Allergy   . Asthma   . Breast cancer ([place])   . Diabetes mellitus    Type 1  . Family history of breast cancer   . Family history of kidney cancer   . Heart murmur   . Hypothyroidism   . Personal history of radiation therapy   . Thyroid disease    Hypothyroidism    PAST SURGICAL HISTORY: Past Surgical History:  Procedure Laterality Date  . APPENDECTOMY    . BREAST  BIOPSY Right 02/14/2016  . BREAST BIOPSY Right 01/09/2016  . BREAST LUMPECTOMY Right 03/26/2016  . BREAST LUMPECTOMY WITH AXILLARY LYMPH NODE BIOPSY Right 03/26/2016   Procedure: RIGHT BREAST LUMPECTOMY WITH AXILLARY LYMPH NODE BIOPSY;  Surgeon: Autumn Messing III, MD;  Location: [facility];  Service: General;  Laterality: Right;  . CARPAL TUNNEL RELEASE Right 02/09/2019   Procedure: RIGHT CARPAL TUNNEL RELEASE;  Surgeon: Daryll Brod, MD;  Location: [facility];  Service: Orthopedics;  Laterality: Right;  . NASAL SINUS SURGERY    . ruptured ovarian cyst    . SHOULDER ARTHROSCOPY  2004   b/L --second one 2011  . TONSILECTOMY, ADENOIDECTOMY, BILATERAL MYRINGOTOMY AND TUBES    . TUBAL LIGATION     then reversed it    FAMILY HISTORY Family History  Problem Relation Age of Onset  . Cancer Mother        cervical  . Hypertension Mother   . Hyperlipidemia Mother   . Heart disease Father 70       MI---- cabg before that  . Hypothyroidism Sister   . Breast cancer Sister 44  . Heart disease Brother 4       MI--- second one 26  . Hyperlipidemia Brother   . Hypertension Brother   . Hypothyroidism Sister   . Kidney cancer Other 24       son of her sister with breast cancer   The patient's father died from heart disease in his 90s in the setting of tobacco abuse. The patient's mother died from pneumonia at the age of 57 also in the setting of tobacco abuse. The patient had one brother, 2 sisters. One of the sisters, Tiffany Browning, was diagnosed with breast cancer at the age of 41. The patient's mother may have had a history of cervical cancer. There is no history of ovarian cancer in the family.    GYNECOLOGIC HISTORY:  No LMP recorded. Patient is postmenopausal.  menarche 41. The patient is GX P0. Tiffany Browning underwent total abdominal hysterectomy with bilateral salpingo-oophorectomy in 1990 for what was thought at that time might be ovarian cancer but proved to be a benign cyst. She has been on estrogen replacement until 01/16/2016 .   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 She is taking courses in advanced codeine at [employer] in is going to be "graduating" late July. Her husband [name] (HALO SHEVLIN) works for [employer]. It's just the 2 of them plus their dog Jasmine at home.     ADVANCED DIRECTIVES:  in place: Both have a living well.    HEALTH MAINTENANCE: Social History   Tobacco Use  . Smoking status: Never Smoker  . Smokeless tobacco: Never Used  Substance Use Topics  . Alcohol use: Yes    Comment: occ  . Drug use: No     Colonoscopy:2007/ [facility]  PAP: s/p hysterectomy  Bone density:  Lipid panel:  Allergies  Allergen Reactions  . Clearsil [Benzoyl Peroxide] Rash    Current Outpatient Medications  Medication Sig Dispense Refill  . albuterol (PROVENTIL) (2.5 MG/3ML) 0.083% nebulizer solution Take 3 mLs (2.5 mg total) by nebulization every 6 (six) hours as needed for wheezing or shortness of breath. 150 mL 1  . fish oil-omega-3 fatty acids 1000 MG capsule Take 1 g by mouth daily.      [name] gabapentin (NEURONTIN) 300 MG capsule Take 1 capsule (300 mg total) by mouth at bedtime.  90 capsule 4  . insulin aspart (NOVOLOG) 100 UNIT/ML injection Per Dr Forde Dandy (Patient taking differently: Inject 0.3-0.4 Units into the skin continuous. Insulin pump-Per Dr Forde Dandy) 3 vial PRN  . levothyroxine (SYNTHROID, LEVOTHROID) 175 MCG tablet Take 175 mcg by mouth daily. Take all days except on Sunday    . montelukast (SINGULAIR) 10 MG tablet Take 10 mg by mouth at bedtime.      . Multiple Vitamin (MULTIVITAMIN) tablet Take 1 tablet by mouth daily.      . ONE TOUCH ULTRA TEST test strip TEST 8 TIMES DAILY OR AS DIRECTED  2  . rosuvastatin (CRESTOR) 5 MG tablet Take 5 mg by mouth daily.  3  . tamoxifen (NOLVADEX) 20 MG tablet Take 1 tablet (20 mg total) by mouth daily. 90 tablet 4  . venlafaxine XR (EFFEXOR-XR) 75 MG 24 hr capsule Take 1 capsule (75 mg total) by mouth daily with breakfast. 180 capsule 2   No current facility-administered medications for this visit.     OBJECTIVE: Middle-aged white woman in no acute distress Vitals:   03/29/19 1536  BP: (!) 114/52  Pulse: 70   Resp: 18  Temp: 98.5 F (36.9 C)  SpO2: 100%     Body mass index is 24.4 kg/m.    ECOG FS:0 - Asymptomatic   Sclerae unicteric, EOMs intact Wearing a mask No cervical or supraclavicular adenopathy Lungs no rales or rhonchi Heart regular rate and rhythm Abd soft, nontender, positive bowel sounds MSK no focal spinal tenderness, no upper extremity lymphedema; the right carpal tunnel surgery has healed very nicely Neuro: nonfocal, well oriented, appropriate affect Breasts: The right breast is status post lumpectomy and radiation with no evidence of disease recurrence.  The cosmetic result is good.  The left breast is benign.  Both axillae are benign.    LAB RESULTS:  CMP     Component Value Date/Time   NA 141 03/29/2019 1504   NA 140 12/15/2016 1234   K 4.2 03/29/2019 1504   K 4.8 12/15/2016 1234   CL 106 03/29/2019 1504   CO2 27 03/29/2019 1504   CO2 28 12/15/2016 1234   GLUCOSE 171 (H) 03/29/2019 1504   GLUCOSE 133 12/15/2016 1234   BUN 16 03/29/2019 1504   BUN 14.6 12/15/2016 1234   CREATININE 0.85 03/29/2019 1504   CREATININE 0.8 12/15/2016 1234   CALCIUM 8.9 03/29/2019 1504   CALCIUM 9.0 12/15/2016 1234   PROT 7.0 03/29/2019 1504   PROT 7.2 12/15/2016 1234   ALBUMIN 3.7 03/29/2019 1504   ALBUMIN 3.7 12/15/2016 1234   AST 30 03/29/2019 1504   AST 31 12/15/2016 1234   ALT 28 03/29/2019 1504   ALT 30 12/15/2016 1234   ALKPHOS 94 03/29/2019 1504   ALKPHOS 109 12/15/2016 1234   BILITOT <0.2 (L) 03/29/2019 1504   BILITOT <0.22 12/15/2016 1234   GFRNONAA >60 03/29/2019 1504   GFRAA >60 03/29/2019 1504    INo results found for: SPEP, UPEP  Lab Results  Component Value Date   WBC 4.8 03/29/2019   NEUTROABS 3.3 03/29/2019   HGB 10.4 (L) 03/29/2019   HCT 32.7 (L) 03/29/2019   MCV 94.0 03/29/2019   PLT 293 03/29/2019      Chemistry      Component Value Date/Time   NA 141 03/29/2019 1504   NA 140 12/15/2016 1234   K 4.2 03/29/2019 1504   K 4.8 12/15/2016  1234   CL 106 03/29/2019 1504   CO2 27 03/29/2019  1504   CO2 28 12/15/2016 1234   BUN 16 03/29/2019 1504   BUN 14.6 12/15/2016 1234   CREATININE 0.85 03/29/2019 1504   CREATININE 0.8 12/15/2016 1234      Component Value Date/Time   CALCIUM 8.9 03/29/2019 1504   CALCIUM 9.0 12/15/2016 1234   ALKPHOS 94 03/29/2019 1504   ALKPHOS 109 12/15/2016 1234   AST 30 03/29/2019 1504   AST 31 12/15/2016 1234   ALT 28 03/29/2019 1504   ALT 30 12/15/2016 1234   BILITOT <0.2 (L) 03/29/2019 1504   BILITOT <0.22 12/15/2016 1234       No results found for: LABCA2  No components found for: LABCA125  No results for input(s): INR in the last 168 hours.  Urinalysis    Component Value Date/Time   COLORURINE yellow 02/06/2010 1109   APPEARANCEUR Clear 02/06/2010 1109   LABSPEC 1.015 02/06/2010 1109   PHURINE 6.5 02/06/2010 1109   HGBUR negative 02/06/2010 1109   BILIRUBINUR negative 01/20/2018 1021   BILIRUBINUR negative 03/07/2016 1150   KETONESUR negative 01/20/2018 1021   PROTEINUR negative 01/20/2018 1021   PROTEINUR negative 03/07/2016 1150   UROBILINOGEN 0.2 01/20/2018 1021   UROBILINOGEN 0.2 02/06/2010 1109   NITRITE Negative 01/20/2018 1021   NITRITE negative 03/07/2016 1150   NITRITE negative 02/06/2010 1109   LEUKOCYTESUR Negative 01/20/2018 1021     STUDIES: Mammography results discussed with the patient who also received a copy  ELIGIBLE FOR AVAILABLE RESEARCH PROTOCOL: [place]  ASSESSMENT: 63 y.o. [place], [place] woman status post right breast upper outer quadrant biopsy 01/09/2016 for a clinical T2 N0, stage IIA invasive ductal carcinoma, grade 2, estrogen and progesterone receptor positive, HER-2 nonamplified, with an MIB-1 of 30%.   (a) biopsy of a second upper outer quadrant area of concern 02/14/2016 was benign  (1) neoadjuvant tamoxifen started 01/28/2016  (2) right lumpectomy and sentinel lymph node sampling 03/26/2016 showed a pT2 pN0, stage IIA invasive ductal  carcinoma, grade 2, with negative margins  (3) Oncotype DX score of 11 predicts a 10 year risk of recurrence outside the breast of 8% if the patient's only systemic therapy is tamoxifen for 5 years. It also predicted no benefit from chemotherapy  (4) adjuvant radiation 05/14/2016 to 06/10/2016 Site/dose:    1. The Right breast was treated to 40.05 Gy in 15 fractions at 2.67 Gy per fraction.  2. The Right breast was boosted to 10 Gy in 5 fractions at 2 Gy per fraction.  (5) tamoxifen continued through local treatment--never interrupted   (6). Genetics testing of the patient's nephew (kidney cancer at age 58) has been recommended  PLAN:  Leinaala is now 3 years out from definitive surgery for breast cancer with no evidence of disease recurrence.  This is very favorable.  She is tolerating the tamoxifen well and the plan is to continue that a minimum of 5 years.  She is benefiting from the venlafaxine and gabapentin and we are continuing those as well.  She will see me again in 1 year.  She knows to call for any other issue that may develop before the next visit.  [name], [name], MD  03/29/19 4:05 PM Medical Oncology and Hematology [facility] [address] [place], [place] [address] Tel. [phone]    Fax. [phone]  I, Jacqualyn Posey am acting as a Education administrator for Chauncey Cruel, MD.   I, Lurline Del MD, have reviewed the above documentation for accuracy and completeness, and I agree with  the above.

## 2019-03-29 ENCOUNTER — Other Ambulatory Visit: Payer: Self-pay

## 2019-03-29 ENCOUNTER — Inpatient Hospital Stay: Payer: 59 | Attending: Oncology | Admitting: Oncology

## 2019-03-29 ENCOUNTER — Inpatient Hospital Stay: Payer: 59

## 2019-03-29 VITALS — BP 114/52 | HR 70 | Temp 98.5°F | Resp 18 | Ht 61.25 in | Wt 130.2 lb

## 2019-03-29 DIAGNOSIS — Z170 Estrogen receptor positive status [ER+]: Secondary | ICD-10-CM | POA: Insufficient documentation

## 2019-03-29 DIAGNOSIS — C50919 Malignant neoplasm of unspecified site of unspecified female breast: Secondary | ICD-10-CM | POA: Insufficient documentation

## 2019-03-29 DIAGNOSIS — Z79810 Long term (current) use of selective estrogen receptor modulators (SERMs): Secondary | ICD-10-CM | POA: Insufficient documentation

## 2019-03-29 DIAGNOSIS — Z794 Long term (current) use of insulin: Secondary | ICD-10-CM | POA: Diagnosis not present

## 2019-03-29 DIAGNOSIS — Z79899 Other long term (current) drug therapy: Secondary | ICD-10-CM | POA: Diagnosis not present

## 2019-03-29 DIAGNOSIS — E119 Type 2 diabetes mellitus without complications: Secondary | ICD-10-CM | POA: Diagnosis not present

## 2019-03-29 DIAGNOSIS — C50411 Malignant neoplasm of upper-outer quadrant of right female breast: Secondary | ICD-10-CM

## 2019-03-29 LAB — COMPREHENSIVE METABOLIC PANEL
ALT: 28 U/L (ref 0–44)
AST: 30 U/L (ref 15–41)
Albumin: 3.7 g/dL (ref 3.5–5.0)
Alkaline Phosphatase: 94 U/L (ref 38–126)
Anion gap: 8 (ref 5–15)
BUN: 16 mg/dL (ref 8–23)
CO2: 27 mmol/L (ref 22–32)
Calcium: 8.9 mg/dL (ref 8.9–10.3)
Chloride: 106 mmol/L (ref 98–111)
Creatinine, Ser: 0.85 mg/dL (ref 0.44–1.00)
GFR calc Af Amer: >60 mL/min (ref >60)
GFR calc non Af Amer: >60 mL/min (ref >60)
Glucose, Bld: 171 mg/dL — ABNORMAL HIGH (ref 70–99)
Potassium: 4.2 mmol/L (ref 3.5–5.1)
Sodium: 141 mmol/L (ref 135–145)
Total Bilirubin: <0.2 mg/dL — ABNORMAL LOW (ref 0.3–1.2)
Total Protein: 7 g/dL (ref 6.5–8.1)

## 2019-03-29 LAB — CBC WITH DIFFERENTIAL/PLATELET
Abs Immature Granulocytes: 0.01 10*3/uL (ref 0.00–0.07)
Basophils Absolute: 0 10*3/uL (ref 0.0–0.1)
Basophils Relative: 0 %
Eosinophils Absolute: 0 10*3/uL (ref 0.0–0.5)
Eosinophils Relative: 0 %
HCT: 32.7 % — ABNORMAL LOW (ref 36.0–46.0)
Hemoglobin: 10.4 g/dL — ABNORMAL LOW (ref 12.0–15.0)
Immature Granulocytes: 0 %
Lymphocytes Relative: 19 %
Lymphs Abs: 0.9 10*3/uL (ref 0.7–4.0)
MCH: 29.9 pg (ref 26.0–34.0)
MCHC: 31.8 g/dL (ref 30.0–36.0)
MCV: 94 fL (ref 80.0–100.0)
Monocytes Absolute: 0.5 10*3/uL (ref 0.1–1.0)
Monocytes Relative: 10 %
Neutro Abs: 3.3 10*3/uL (ref 1.7–7.7)
Neutrophils Relative %: 71 %
Platelets: 293 10*3/uL (ref 150–400)
RBC: 3.48 MIL/uL — ABNORMAL LOW (ref 3.87–5.11)
RDW: 12.6 % (ref 11.5–15.5)
WBC: 4.8 10*3/uL (ref 4.0–10.5)
nRBC: 0 % (ref 0.0–0.2)

## 2019-03-29 MED ORDER — GABAPENTIN 300 MG PO CAPS: 300.0000 mg | ORAL_CAPSULE | Freq: Every day | ORAL | 4 refills | Status: DC

## 2019-03-29 MED ORDER — VENLAFAXINE HCL ER 75 MG PO CP24: 75.0000 mg | ORAL_CAPSULE | Freq: Every day | ORAL | 2 refills | Status: DC

## 2019-03-29 MED ORDER — TAMOXIFEN CITRATE 20 MG PO TABS: 20.0000 mg | ORAL_TABLET | Freq: Every day | ORAL | 4 refills | Status: DC

## 2019-03-30 ENCOUNTER — Telehealth: Payer: Self-pay | Admitting: Oncology

## 2019-03-30 NOTE — Telephone Encounter (Signed)
I talk with patient regarding schedule  

## 2019-06-30 ENCOUNTER — Encounter: Payer: 59 | Admitting: Family Medicine

## 2019-09-13 DIAGNOSIS — M65311 Trigger thumb, right thumb: Secondary | ICD-10-CM | POA: Insufficient documentation

## 2019-12-11 ENCOUNTER — Other Ambulatory Visit (HOSPITAL_COMMUNITY): Payer: Self-pay | Admitting: Endocrinology

## 2019-12-16 ENCOUNTER — Emergency Department (INDEPENDENT_AMBULATORY_CARE_PROVIDER_SITE_OTHER)
Admission: EM | Admit: 2019-12-16 | Discharge: 2019-12-16 | Disposition: A | Discharge status: Home / Self Care | Payer: 59 | Source: Home / Self Care

## 2019-12-16 ENCOUNTER — Other Ambulatory Visit: Payer: Self-pay

## 2019-12-16 DIAGNOSIS — H9201 Otalgia, right ear: Secondary | ICD-10-CM | POA: Diagnosis not present

## 2019-12-16 DIAGNOSIS — M545 Low back pain, unspecified: Secondary | ICD-10-CM

## 2019-12-16 DIAGNOSIS — M549 Dorsalgia, unspecified: Secondary | ICD-10-CM

## 2019-12-16 MED ORDER — IPRATROPIUM BROMIDE 0.06 % NA SOLN: 2.0000 | Freq: Four times a day (QID) | NASAL | 1 refills | Status: DC

## 2019-12-16 MED ORDER — CYCLOBENZAPRINE HCL 5 MG PO TABS: 5.0000 mg | ORAL_TABLET | Freq: Two times a day (BID) | ORAL | 0 refills | Status: DC | PRN

## 2019-12-16 NOTE — ED Triage Notes (Signed)
Pt c/o RT ear pain x 4 days. Taking left over antibiotic, 2 doses down. Also c/o LT sided weakness that started last night as well as soreness. Says she did work out last week and not sure if related. Taking advil prn.

## 2019-12-16 NOTE — ED Provider Notes (Signed)
[facility]    CSN: [id number] Arrival date & time: 12/16/19  1052      History   Chief Complaint Chief Complaint  Patient presents with  . Otalgia    RT  . Fatigue    Weakness; LT side    HPI Tiffany Browning is a 64 y.o. female.   HPI Tiffany Browning is a 64 y.o. female presenting to UC with c/o Right ear pain that started 4 days ago. Pain is aching and sore. She found some leftover amoxicillin and took 2 doses but no relief of the ear pain.  Pt also reports Left sided soreness in her upper and lower back with associated weak feeling on that side but denies numbness or tingling. 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  She does lift patients at work but does not recall any recent injury.  She has taken Advil as needed with mild relief.  Back/left side pain is aching and sore, 3/10.  She also reports flare up of Left wrist pain from carpal tunnel so she has been wearing her wrist brace recently. She is Right hand dominant.    Past Medical History:  Diagnosis Date  . Allergy   . Asthma   . Breast cancer ([place])   . Diabetes mellitus    Type 1  . Family history of breast cancer   . Family history of kidney cancer   . Heart murmur   . Hypothyroidism   . Personal history of radiation therapy   . Thyroid disease    Hypothyroidism    Patient Active Problem List   Diagnosis Date Noted  . Pansinusitis 12/21/2016  . Bronchitis 10/31/2016  . Family history of breast cancer   . Family history of kidney cancer   . Breast cancer of upper-outer quadrant of right female breast ([place]) 01/28/2016  . Sinusitis, acute maxillary 07/11/2014  . Wheezing 07/11/2014  . VAGINITIS, CANDIDAL 02/06/2010  . VAGINITIS, BACTERIAL 02/06/2010  . DYSURIA 09/20/2007  . HYPOTHYROIDISM 02/21/2007  . DIABETES MELLITUS, TYPE I 02/21/2007  . EXTERNAL  OTITIS 02/21/2007  . ALLERGIC RHINITIS 02/21/2007  . ASTHMA 02/21/2007    Past Surgical History:  Procedure Laterality Date  . APPENDECTOMY    . BREAST BIOPSY Right 02/14/2016  . BREAST BIOPSY Right 01/09/2016  . BREAST LUMPECTOMY Right 03/26/2016  . BREAST LUMPECTOMY WITH AXILLARY LYMPH NODE BIOPSY Right 03/26/2016   Procedure: RIGHT BREAST LUMPECTOMY WITH AXILLARY LYMPH NODE BIOPSY;  Surgeon: Autumn Messing III, MD;  Location: [facility];  Service: General;  Laterality: Right;  . CARPAL TUNNEL RELEASE Right 02/09/2019   Procedure: RIGHT CARPAL TUNNEL RELEASE;  Surgeon: Daryll Brod, MD;  Location: [facility];  Service: Orthopedics;  Laterality: Right;  . NASAL SINUS SURGERY    . ruptured ovarian cyst    . SHOULDER ARTHROSCOPY  2004   b/L --second one 2011  . TONSILECTOMY, ADENOIDECTOMY, BILATERAL MYRINGOTOMY AND TUBES    . TUBAL LIGATION     then reversed it    OB History   No obstetric history on file.      Home Medications    Prior to Admission medications   Medication Sig Start Date End Date Taking? Authorizing Provider  albuterol (PROVENTIL) (2.5 MG/3ML) 0.083% nebulizer solution Take 3 mLs (2.5 mg total) by nebulization every 6 (six) hours as needed for  wheezing or shortness of breath. 10/29/16   [name], DO  cyclobenzaprine (FLEXERIL) 5 MG tablet Take 1-2 tablets (5-10 mg total) by mouth 2 (two) times daily as needed for muscle spasms. 12/16/19   Noe Gens, PA-C  fish oil-omega-3 fatty acids 1000 MG capsule Take 1 g by mouth daily.      [provider]  gabapentin (NEURONTIN) 300 MG capsule Take 1 capsule (300 mg total) by mouth at bedtime. 03/29/19   [name], [name], MD  insulin aspart (NOVOLOG) 100 UNIT/ML injection Per Dr Forde Dandy Patient taking differently: Inject 0.3-0.4 Units into the skin continuous. Insulin pump-Per Dr Forde Dandy 11/21/13   Carollee Herter, Kendrick Fries R, DO  ipratropium (ATROVENT) 0.06 % nasal spray Place 2 sprays into both nostrils  4 (four) times daily. 12/16/19   Noe Gens, PA-C  levothyroxine (SYNTHROID, LEVOTHROID) 175 MCG tablet Take 175 mcg by mouth daily. Take all days except on Sunday    [provider]  montelukast (SINGULAIR) 10 MG tablet Take 10 mg by mouth at bedtime.      [provider]  Multiple Vitamin (MULTIVITAMIN) tablet Take 1 tablet by mouth daily.      [provider]  ONE TOUCH ULTRA TEST test strip TEST 8 TIMES DAILY OR AS DIRECTED 08/11/16   [provider]  rosuvastatin (CRESTOR) 5 MG tablet Take 5 mg by mouth daily. 12/02/17   [provider]  tamoxifen (NOLVADEX) 20 MG tablet Take 1 tablet (20 mg total) by mouth daily. 03/29/19   [name], [name], MD  venlafaxine XR (EFFEXOR-XR) 75 MG 24 hr capsule Take 1 capsule (75 mg total) by mouth daily with breakfast. 03/29/19   [name], [name], MD    Family History Family History  Problem Relation Age of Onset  . Cancer Mother        cervical  . Hypertension Mother   . Hyperlipidemia Mother   . Heart disease Father 34       MI---- cabg before that  . Hypothyroidism Sister   . Breast cancer Sister 64  . Heart disease Brother 43       MI--- second one 21  . Hyperlipidemia Brother   . Hypertension Brother   . Hypothyroidism Sister   . Kidney cancer Other 42       son of her sister with breast cancer    Social History Social History   Tobacco Use  . Smoking status: Never Smoker  . Smokeless tobacco: Never Used  Substance Use Topics  . Alcohol use: Yes    Comment: occ  . Drug use: No     Allergies   Clearsil [benzoyl peroxide]   Review of Systems Review of Systems  Constitutional: Negative for chills and fever.  HENT: Positive for congestion and ear pain (Right). Negative for sore throat, trouble swallowing and voice change.   Respiratory: Negative for cough and shortness of breath.   Cardiovascular: Negative for chest pain and palpitations.  Gastrointestinal: Negative for  abdominal pain, diarrhea, nausea and vomiting.  Musculoskeletal: Positive for back pain and myalgias. Negative for arthralgias.  Skin: Negative for rash.  Neurological: Positive for weakness. Negative for dizziness, syncope, facial asymmetry, speech difficulty, light-headedness, numbness and headaches.  All other systems reviewed and are negative.    Physical Exam Triage Vital Signs ED Triage Vitals  Enc Vitals Group     BP 12/16/19 1105 (!) 106/57     Pulse Rate 12/16/19 1105 70  Resp 12/16/19 1105 18     Temp 12/16/19 1105 97.9 F (36.6 C)     Temp Source 12/16/19 1105 Oral     SpO2 12/16/19 1105 93 %     Weight --      Height --      Head Circumference --      Peak Flow --      Pain Score 12/16/19 1106 3     Pain Loc --      Pain Edu? --      Excl. in [place]? --    No data found.  Updated Vital Signs BP (!) 106/57 (BP Location: Left Arm)   Pulse 70   Temp 97.9 F (36.6 C) (Oral)   Resp 18   SpO2 93%   Visual Acuity Right Eye Distance:   Left Eye Distance:   Bilateral Distance:    Right Eye Near:   Left Eye Near:    Bilateral Near:     Physical Exam Vitals and nursing note reviewed.  Constitutional:      Appearance: Normal appearance. She is well-developed.  HENT:     Head: Normocephalic and atraumatic.     Right Ear: Ear canal normal. A middle ear effusion ( mild, clear) is present.     Left Ear: Tympanic membrane and ear canal normal.     Nose: Nose normal.     Right Sinus: No maxillary sinus tenderness or frontal sinus tenderness.     Left Sinus: No maxillary sinus tenderness or frontal sinus tenderness.     Mouth/Throat:     Lips: Pink.     Mouth: Mucous membranes are moist.     Pharynx: Oropharynx is clear. Uvula midline.  Eyes:     Extraocular Movements: Extraocular movements intact.     Conjunctiva/sclera: Conjunctivae normal.     Pupils: Pupils are equal, round, and reactive to light.  Cardiovascular:     Rate and Rhythm: Normal rate and  regular rhythm.  Pulmonary:     Effort: Pulmonary effort is normal. No respiratory distress.     Breath sounds: Normal breath sounds. No stridor. No wheezing, rhonchi or rales.  Musculoskeletal:        General: Tenderness present. Normal range of motion.     Cervical back: Normal range of motion.       Back:     Comments: Left wrist in wrist splint. Muscular tenderness noted to the back, no bony tenderness.  Full ROM upper and lower extremities bilaterally with 5/5 strength. Normal gait.   Skin:    General: Skin is warm and dry.     Capillary Refill: Capillary refill takes less than 2 seconds.  Neurological:     General: No focal deficit present.     Mental Status: She is alert and oriented to person, place, and time.     Cranial Nerves: No cranial nerve deficit.     Sensory: No sensory deficit.     Motor: No weakness.     Coordination: Coordination normal.     Gait: Gait normal.  Psychiatric:        Mood and Affect: Mood normal.        Behavior: Behavior normal.      UC Treatments / Results  Labs (all labs ordered are listed, but only abnormal results are displayed) Labs Reviewed - No data to display  EKG   Radiology No results found.  Procedures Procedures (including critical care time)  Medications Ordered in UC Medications -  No data to display  Initial Impression / Assessment and Plan / UC Course  I have reviewed the triage vital signs and the nursing notes.  Pertinent labs & imaging results that were available during my care of the patient were reviewed by me and considered in my medical decision making (see chart for details).     Tympanometry: WNL in Both ears Ear pain- no evidence of bacterial infection at this time. Encouraged sinus rinses and taking allergic medications.  Subjective Left side weakness- muscular tenderness noted on exam. No red flag symptoms. Doubt CVA or SAH. Doubt cauda equina or spinal abscess.  Encouraged to continue Advil.  May also alternate ice and heat.   Encouraged f/u with PCP later this week if not improving. Discussed symptoms that warrant emergent care in the ED.  AVS provided  Final Clinical Impressions(s) / UC Diagnoses   Final diagnoses:  Acute otalgia, right  Upper back pain on left side  Acute left-sided low back pain without sciatica     Discharge Instructions      Flexeril (cyclobenzaprine) is a muscle relaxer and may cause drowsiness. Do not drink alcohol, drive, or operate heavy machinery while taking.  You may alternate acetaminophen with the advil you are taking for pain.  Please follow up with your family doctor later this week if not improving.     ED Prescriptions    Medication Sig Dispense Auth. Provider   cyclobenzaprine (FLEXERIL) 5 MG tablet Take 1-2 tablets (5-10 mg total) by mouth 2 (two) times daily as needed for muscle spasms. 20 tablet Gerarda Fraction, Aziel Morgan O, PA-C   ipratropium (ATROVENT) 0.06 % nasal spray Place 2 sprays into both nostrils 4 (four) times daily. 15 mL Noe Gens, PA-C     PDMP not reviewed this encounter.   Noe Gens, [place] 12/17/19 [phone]

## 2019-12-16 NOTE — Discharge Instructions (Signed)
°  Flexeril (cyclobenzaprine) is a muscle relaxer and may cause drowsiness. Do not drink alcohol, drive, or operate heavy machinery while taking.  You may alternate acetaminophen with the advil you are taking for pain.  Please follow up with your family doctor later this week if not improving.

## 2019-12-20 ENCOUNTER — Other Ambulatory Visit: Payer: 59

## 2019-12-20 ENCOUNTER — Ambulatory Visit: Payer: 59 | Admitting: Oncology

## 2020-03-04 ENCOUNTER — Other Ambulatory Visit: Payer: Self-pay | Admitting: Oncology

## 2020-03-05 ENCOUNTER — Telehealth: Payer: Self-pay | Admitting: Oncology

## 2020-03-05 NOTE — Telephone Encounter (Signed)
Rescheduled appointment per 7/19 message. Patient is aware of new appointment date and time.

## 2020-03-27 ENCOUNTER — Other Ambulatory Visit: Payer: Self-pay | Admitting: *Deleted

## 2020-03-27 DIAGNOSIS — C50411 Malignant neoplasm of upper-outer quadrant of right female breast: Secondary | ICD-10-CM

## 2020-03-28 ENCOUNTER — Ambulatory Visit: Payer: 59 | Admitting: Oncology

## 2020-03-28 ENCOUNTER — Other Ambulatory Visit: Payer: 59

## 2020-03-29 ENCOUNTER — Other Ambulatory Visit: Payer: Self-pay

## 2020-03-29 ENCOUNTER — Inpatient Hospital Stay: Payer: 59 | Attending: Adult Health

## 2020-03-29 ENCOUNTER — Inpatient Hospital Stay (HOSPITAL_BASED_OUTPATIENT_CLINIC_OR_DEPARTMENT_OTHER): Payer: 59 | Admitting: Adult Health

## 2020-03-29 VITALS — BP 122/74 | HR 64 | Temp 98.6°F | Resp 18 | Ht 61.25 in | Wt 129.4 lb

## 2020-03-29 DIAGNOSIS — Z170 Estrogen receptor positive status [ER+]: Secondary | ICD-10-CM | POA: Diagnosis not present

## 2020-03-29 DIAGNOSIS — E119 Type 2 diabetes mellitus without complications: Secondary | ICD-10-CM | POA: Insufficient documentation

## 2020-03-29 DIAGNOSIS — C50411 Malignant neoplasm of upper-outer quadrant of right female breast: Secondary | ICD-10-CM | POA: Insufficient documentation

## 2020-03-29 DIAGNOSIS — Z79899 Other long term (current) drug therapy: Secondary | ICD-10-CM | POA: Insufficient documentation

## 2020-03-29 DIAGNOSIS — Z79810 Long term (current) use of selective estrogen receptor modulators (SERMs): Secondary | ICD-10-CM | POA: Insufficient documentation

## 2020-03-29 LAB — CBC WITH DIFFERENTIAL (CANCER CENTER ONLY)
Abs Immature Granulocytes: 0 10*3/uL (ref 0.00–0.07)
Basophils Absolute: 0 10*3/uL (ref 0.0–0.1)
Basophils Relative: 0 %
Eosinophils Absolute: 0.1 10*3/uL (ref 0.0–0.5)
Eosinophils Relative: 4 %
HCT: 36 % (ref 36.0–46.0)
Hemoglobin: 11.5 g/dL — ABNORMAL LOW (ref 12.0–15.0)
Immature Granulocytes: 0 %
Lymphocytes Relative: 31 %
Lymphs Abs: 1.2 10*3/uL (ref 0.7–4.0)
MCH: 30.5 pg (ref 26.0–34.0)
MCHC: 31.9 g/dL (ref 30.0–36.0)
MCV: 95.5 fL (ref 80.0–100.0)
Monocytes Absolute: 0.5 10*3/uL (ref 0.1–1.0)
Monocytes Relative: 12 %
Neutro Abs: 2 10*3/uL (ref 1.7–7.7)
Neutrophils Relative %: 53 %
Platelet Count: 314 10*3/uL (ref 150–400)
RBC: 3.77 MIL/uL — ABNORMAL LOW (ref 3.87–5.11)
RDW: 12.5 % (ref 11.5–15.5)
WBC Count: 3.8 10*3/uL — ABNORMAL LOW (ref 4.0–10.5)
nRBC: 0 % (ref 0.0–0.2)

## 2020-03-29 LAB — CMP (CANCER CENTER ONLY)
ALT: 27 U/L (ref 0–44)
AST: 33 U/L (ref 15–41)
Albumin: 3.8 g/dL (ref 3.5–5.0)
Alkaline Phosphatase: 109 U/L (ref 38–126)
Anion gap: 8 (ref 5–15)
BUN: 14 mg/dL (ref 8–23)
CO2: 27 mmol/L (ref 22–32)
Calcium: 10.1 mg/dL (ref 8.9–10.3)
Chloride: 108 mmol/L (ref 98–111)
Creatinine: 0.86 mg/dL (ref 0.44–1.00)
GFR, Est AFR Am: >60 mL/min (ref >60)
GFR, Estimated: >60 mL/min (ref >60)
Glucose, Bld: 148 mg/dL — ABNORMAL HIGH (ref 70–99)
Potassium: 4.7 mmol/L (ref 3.5–5.1)
Sodium: 143 mmol/L (ref 135–145)
Total Bilirubin: 0.4 mg/dL (ref 0.3–1.2)
Total Protein: 7.3 g/dL (ref 6.5–8.1)

## 2020-03-29 NOTE — Progress Notes (Signed)
[facility]  Telephone:(336) [phone] Fax:(336) [phone]     ID: CHAYSE GRACEY DOB: 1956/01/19  MR#: [id number]  KDT#:[id number]  Patient Care Team: Crist Infante, MD as PCP - General (Internal Medicine) Reynold Bowen, MD as Consulting Physician (Endocrinology) [name], MD as Consulting Physician (General Surgery) [name], [name], MD as Consulting Physician (Oncology) [name], MD as Attending Physician (Radiation Oncology) [name], NP as Nurse Practitioner (Hematology and Oncology) OTHER MD:   CHIEF COMPLAINT: Estrogen receptor positive breast cancer  CURRENT TREATMENT:  tamoxifen   INTERVAL HISTORY: Gwyneth returns today for follow-up and treatment of her estrogen receptor positive breast cancer.  She continues on tamoxifen. She has some mild hot flashes. She denies vaginal wetness.  She notes she has some mild dyspareunia, however, notes that intercourse is still enjoyable for her.    She has not yet had her annual mammogram.  This is on her list to do for August.  Her mammogram in 01/2019 was normal.  She has not noticed any breast changes.   REVIEW OF SYSTEMS: Creasie is exercising regularly.  She is up to date with her PCP visits.  She denies any new issues this year such as fever, chills, chest pain, palpitations, cough, shortness of breath, nausea, vomiting, bowel/bladder changes, or any other concerns.  A detailed ROS was otherwise non contributory.     BREAST CANCER HISTORY: From the original intake note:  Evoleth had routine physical exam by her gynecologist in May 2017 and she was able to palpate a mass in the upper-outer quadrant of her right breast. The patient had had bilateral screening mammography [facility]/26/2015 at the [facility], with no suspicious findings. On 12/31/2015 she underwent bilateral diagnostic mammography with tomography and right breast ultrasonography. The breast density was category D. On focal spot  compression in the area of palpable concern there was a possible mass, hard to make out because of the breast density. On exam there was a firm palpable lump measuring approximately 3 cm at the 9:00 region of the right breast, 4 cm from the nipple. The overlying skin was unremarkable. Ultrasonography confirmed an irregular hypoechoic mass in the area in question measuring 3.0 cm.  Also, separate from the palpable mass at the 9:00 position but 2 cm from the nipple was a hypoechoic oval mass measuring 2.0 cm. Ultrasound of the right axilla was negative.  Biopsy of both the masses in question from the right rest were obtained 01/09/2016. This showed (SAA [id number]) the mass closer to the nipple, S2 centimeters, to be a fibroadenoma. The more distant mass, 4 cm from the nipple, was invasive ductal carcinoma, E-cadherin positive, grade 2, estrogen receptor 100% positive, progesterone receptor 100% positive, with an MIB-1 of 30%, and no HER-2 amplification, the signals ratio being 1.43 and the number per cell 2.15.  The patient's subsequent history is as detailed above.     [name] PAST MEDICAL HISTORY: Past Medical History:  Diagnosis Date  . Allergy   . Asthma   . Breast cancer ([place])   . Diabetes mellitus    Type 1  . Family history of breast cancer   . Family history of kidney cancer   . Heart murmur   . Hypothyroidism   . Personal history of radiation therapy   . Thyroid disease    Hypothyroidism    PAST SURGICAL HISTORY: Past Surgical History:  Procedure Laterality Date  . APPENDECTOMY    . BREAST BIOPSY Right 02/14/2016  .  BREAST BIOPSY Right 01/09/2016  . BREAST LUMPECTOMY Right 03/26/2016  . BREAST LUMPECTOMY WITH AXILLARY LYMPH NODE BIOPSY Right 03/26/2016   Procedure: RIGHT BREAST LUMPECTOMY WITH AXILLARY LYMPH NODE BIOPSY;  Surgeon: Autumn Messing III, MD;  Location: [facility];  Service: General;  Laterality: Right;  . CARPAL TUNNEL RELEASE Right 02/09/2019   Procedure: RIGHT CARPAL TUNNEL  RELEASE;  Surgeon: Daryll Brod, MD;  Location: [facility];  Service: Orthopedics;  Laterality: Right;  . NASAL SINUS SURGERY    . ruptured ovarian cyst    . SHOULDER ARTHROSCOPY  2004   b/L --second one 2011  . TONSILECTOMY, ADENOIDECTOMY, BILATERAL MYRINGOTOMY AND TUBES    . TUBAL LIGATION     then reversed it    FAMILY HISTORY Family History  Problem Relation Age of Onset  . Cancer Mother        cervical  . Hypertension Mother   . Hyperlipidemia Mother   . Heart disease Father 2       MI---- cabg before that  . Hypothyroidism Sister   . Breast cancer Sister 15  . Heart disease Brother 48       MI--- second one 61  . Hyperlipidemia Brother   . Hypertension Brother   . Hypothyroidism Sister   . Kidney cancer Other 68       son of her sister with breast cancer   The patient's father died from heart disease in his 62s in the setting of tobacco abuse. The patient's mother died from pneumonia at the age of 63 also in the setting of tobacco abuse. The patient had one brother, 2 sisters. One of the sisters, Basilio Cairo, was diagnosed with breast cancer at the age of 28. The patient's mother may have had a history of cervical cancer. There is no history of ovarian cancer in the family.    GYNECOLOGIC HISTORY:  No LMP recorded. Patient is postmenopausal.  menarche 40. The patient is GX P0. Maitland underwent total abdominal hysterectomy with bilateral salpingo-oophorectomy in 1990 for what was thought at that time might be ovarian cancer but proved to be a benign cyst. She has been on estrogen replacement until 01/16/2016 .   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 She is taking courses in advanced codeine at [employer] in is going to be "graduating" late July. Her husband [name] (OPIE FANTON) works for [employer]. It's just the 2 of them plus their dog Jasmine at home.     ADVANCED DIRECTIVES:  in place:  Both have a living well.    HEALTH MAINTENANCE: Social History   Tobacco Use  . Smoking status: Never Smoker  . Smokeless tobacco: Never Used  Vaping Use  . Vaping Use: Never used  Substance Use Topics  . Alcohol use: Yes    Comment: occ  . Drug use: No     Colonoscopy:2007/ [facility]  PAP: s/p hysterectomy  Bone density:  Lipid panel:  Allergies  Allergen Reactions  . Clearsil [Benzoyl Peroxide] Rash    Current Outpatient Medications  Medication Sig Dispense Refill  . Alpha-Lipoic Acid 300 MG CAPS Take by mouth daily.    [name] b complex vitamins tablet Take 1 tablet by mouth daily.    . Biotin 10000 MCG TABS Take 1 tablet by mouth daily.    . Calcium-Magnesium-Vitamin D (CALCIUM MAGNESIUM PO) Take 1 tablet by mouth daily.    . Cinnamon 500 MG capsule Take 500  mg by mouth daily.    . fish oil-omega-3 fatty acids 1000 MG capsule Take 1 g by mouth daily.      . insulin aspart (NOVOLOG) 100 UNIT/ML injection Per Dr Forde Dandy (Patient taking differently: Inject 0.3-0.4 Units into the skin continuous. Insulin pump-Per Dr Forde Dandy) 3 vial PRN  . ipratropium (ATROVENT) 0.06 % nasal spray Place 2 sprays into both nostrils 4 (four) times daily. 15 mL 1  . levothyroxine (SYNTHROID, LEVOTHROID) 175 MCG tablet Take 175 mcg by mouth daily. Take all days except on Sunday    . montelukast (SINGULAIR) 10 MG tablet Take 10 mg by mouth at bedtime.      . Multiple Vitamin (MULTIVITAMIN) tablet Take 1 tablet by mouth daily.      . ONE TOUCH ULTRA TEST test strip TEST 8 TIMES DAILY OR AS DIRECTED  2  . rosuvastatin (CRESTOR) 5 MG tablet Take 5 mg by mouth daily.  3  . tamoxifen (NOLVADEX) 20 MG tablet Take 1 tablet (20 mg total) by mouth daily. 90 tablet 4  . venlafaxine XR (EFFEXOR-XR) 75 MG 24 hr capsule Take 1 capsule (75 mg total) by mouth daily with breakfast. 180 capsule 2  . albuterol (PROVENTIL) (2.5 MG/3ML) 0.083% nebulizer solution Take 3 mLs (2.5 mg total) by nebulization every 6 (six) hours as  needed for wheezing or shortness of breath. 150 mL 1   No current facility-administered medications for this visit.    OBJECTIVE:  Vitals:   03/29/20 0942  BP: 122/74  Pulse: 64  Resp: 18  Temp: 98.6 F (37 C)  SpO2: 98%     Body mass index is 24.25 kg/m.    ECOG FS:0 - Asymptomatic  GENERAL: Patient is a well appearing female in no acute distress HEENT:  Sclerae anicteric.  Mask in place. Neck is supple.  NODES:  No cervical, supraclavicular, or axillary lymphadenopathy palpated.  BREAST EXAM:  Right breast s/p lumpectomy and radiation, no sign of local recurrence, left breast benign. LUNGS:  Clear to auscultation bilaterally.  No wheezes or rhonchi. HEART:  Regular rate and rhythm. No murmur appreciated. ABDOMEN:  Soft, nontender.  Positive, normoactive bowel sounds. No organomegaly palpated. MSK:  No focal spinal tenderness to palpation. Full range of motion bilaterally in the upper extremities. EXTREMITIES:  No peripheral edema.   SKIN:  Clear with no obvious rashes or skin changes. No nail dyscrasia. NEURO:  Nonfocal. Well oriented.  Appropriate affect.      LAB RESULTS:  CMP     Component Value Date/Time   NA 143 03/29/2020 0919   NA 140 12/15/2016 1234   K 4.7 03/29/2020 0919   K 4.8 12/15/2016 1234   CL 108 03/29/2020 0919   CO2 27 03/29/2020 0919   CO2 28 12/15/2016 1234   GLUCOSE 148 (H) 03/29/2020 0919   GLUCOSE 133 12/15/2016 1234   BUN 14 03/29/2020 0919   BUN 14.6 12/15/2016 1234   CREATININE 0.86 03/29/2020 0919   CREATININE 0.8 12/15/2016 1234   CALCIUM 10.1 03/29/2020 0919   CALCIUM 9.0 12/15/2016 1234   PROT 7.3 03/29/2020 0919   PROT 7.2 12/15/2016 1234   ALBUMIN 3.8 03/29/2020 0919   ALBUMIN 3.7 12/15/2016 1234   AST 33 03/29/2020 0919   AST 31 12/15/2016 1234   ALT 27 03/29/2020 0919   ALT 30 12/15/2016 1234   ALKPHOS 109 03/29/2020 0919   ALKPHOS 109 12/15/2016 1234   BILITOT 0.4 03/29/2020 0919   BILITOT <0.22 12/15/2016 1234  GFRNONAA >60 03/29/2020 0919   GFRAA >60 03/29/2020 0919    INo results found for: SPEP, UPEP  Lab Results  Component Value Date   WBC 3.8 (L) 03/29/2020   NEUTROABS 2.0 03/29/2020   HGB 11.5 (L) 03/29/2020   HCT 36.0 03/29/2020   MCV 95.5 03/29/2020   PLT 314 03/29/2020      Chemistry      Component Value Date/Time   NA 143 03/29/2020 0919   NA 140 12/15/2016 1234   K 4.7 03/29/2020 0919   K 4.8 12/15/2016 1234   CL 108 03/29/2020 0919   CO2 27 03/29/2020 0919   CO2 28 12/15/2016 1234   BUN 14 03/29/2020 0919   BUN 14.6 12/15/2016 1234   CREATININE 0.86 03/29/2020 0919   CREATININE 0.8 12/15/2016 1234      Component Value Date/Time   CALCIUM 10.1 03/29/2020 0919   CALCIUM 9.0 12/15/2016 1234   ALKPHOS 109 03/29/2020 0919   ALKPHOS 109 12/15/2016 1234   AST 33 03/29/2020 0919   AST 31 12/15/2016 1234   ALT 27 03/29/2020 0919   ALT 30 12/15/2016 1234   BILITOT 0.4 03/29/2020 0919   BILITOT <0.22 12/15/2016 1234       No results found for: LABCA2  No components found for: LABCA125  No results for input(s): INR in the last 168 hours.  Urinalysis    Component Value Date/Time   COLORURINE yellow 02/06/2010 1109   APPEARANCEUR Clear 02/06/2010 1109   LABSPEC 1.015 02/06/2010 1109   PHURINE 6.5 02/06/2010 1109   HGBUR negative 02/06/2010 1109   BILIRUBINUR negative 01/20/2018 1021   BILIRUBINUR negative 03/07/2016 1150   KETONESUR negative 01/20/2018 1021   PROTEINUR negative 01/20/2018 1021   PROTEINUR negative 03/07/2016 1150   UROBILINOGEN 0.2 01/20/2018 1021   UROBILINOGEN 0.2 02/06/2010 1109   NITRITE Negative 01/20/2018 1021   NITRITE negative 03/07/2016 1150   NITRITE negative 02/06/2010 1109   LEUKOCYTESUR Negative 01/20/2018 1021     STUDIES: Mammography results discussed with the patient who also received a copy  ELIGIBLE FOR AVAILABLE RESEARCH PROTOCOL: P[place]AS  ASSESSMENT: 63 y.o. [place], [place] woman status post right breast upper outer  quadrant biopsy 01/09/2016 for a clinical T2 N0, stage IIA invasive ductal carcinoma, grade 2, estrogen and progesterone receptor positive, HER-2 nonamplified, with an MIB-1 of 30%.   (a) biopsy of a second upper outer quadrant area of concern 02/14/2016 was benign  (1) neoadjuvant tamoxifen started 01/28/2016  (2) right lumpectomy and sentinel lymph node sampling 03/26/2016 showed a pT2 pN0, stage IIA invasive ductal carcinoma, grade 2, with negative margins  (3) Oncotype DX score of 11 predicts a 10 year risk of recurrence outside the breast of 8% if the patient's only systemic therapy is tamoxifen for 5 years. It also predicted no benefit from chemotherapy  (4) adjuvant radiation 05/14/2016 to 06/10/2016 Site/dose:    1. The Right breast was treated to 40.05 Gy in 15 fractions at 2.67 Gy per fraction.  2. The Right breast was boosted to 10 Gy in 5 fractions at 2 Gy per fraction.  (5) tamoxifen continued through local treatment--never interrupted   (6). Genetics testing of the patient's nephew (kidney cancer at age 69) has been recommended  PLAN:  Deania is here today for follow up and evaluation of her stage IIA right breast estrogen positive breast cancer diagnosed in 2017.  She has no clinical sign of breast cancer recurrence today, which is favorable.  She continues on Tamoxifen  daily and tolerates this well.  We discussed treatment duration of 5-10 years depending on tolerance, and discussion of risk mitigation at the 5 year time period.    Alyssia is overdue for her mammogram, which has been ordered.  She is going to have this complete din 03/2020.  [name] and I discussed healthy diet and exercise.  I recommended that she stay up to date with her PCP for routine f/u and staying up to date with her other cancer screenings.    We will see Marjan back in 1 year for labs and f/u.  She knows to call for any questions that may arise between now and her next appointment.  We are happy to see  her sooner if needed.   Total encounter time: 20 minutes*  Wilber Bihari, NP 03/31/20 9:55 AM Medical Oncology and Hematology [facility] [place], [place] [address] Tel. [phone]    Fax. [phone]  *Total Encounter Time as defined by the Centers for Medicare and Medicaid Services includes, in addition to the face-to-face time of a patient visit (documented in the note above) non-face-to-face time: obtaining and reviewing outside history, ordering and reviewing medications, tests or procedures, care coordination (communications with other health care professionals or caregivers) and documentation in the medical record.

## 2020-03-31 ENCOUNTER — Encounter: Payer: Self-pay | Admitting: Adult Health

## 2020-04-01 ENCOUNTER — Telehealth: Payer: Self-pay | Admitting: Adult Health

## 2020-04-01 NOTE — Telephone Encounter (Signed)
Scheduled appts per 8/13 los and 8/16 staff msg. Pt confirmed appt date and time.

## 2020-04-02 ENCOUNTER — Other Ambulatory Visit: Payer: Self-pay | Admitting: Oncology

## 2020-04-02 DIAGNOSIS — Z9889 Other specified postprocedural states: Secondary | ICD-10-CM

## 2020-04-03 ENCOUNTER — Other Ambulatory Visit: Payer: Self-pay | Admitting: Oncology

## 2020-04-03 ENCOUNTER — Other Ambulatory Visit: Payer: Self-pay | Admitting: Adult Health

## 2020-04-18 ENCOUNTER — Ambulatory Visit
Admission: RE | Admit: 2020-04-18 | Discharge: 2020-04-18 | Disposition: A | Discharge status: Home / Self Care | Payer: 59 | Source: Ambulatory Visit | Attending: Oncology | Admitting: Oncology

## 2020-04-18 ENCOUNTER — Other Ambulatory Visit: Payer: Self-pay

## 2020-04-18 DIAGNOSIS — Z9889 Other specified postprocedural states: Secondary | ICD-10-CM

## 2020-05-19 ENCOUNTER — Other Ambulatory Visit (HOSPITAL_COMMUNITY): Payer: Self-pay | Admitting: Endocrinology

## 2020-06-03 MED FILL — ACCU-CHEK GUIDE TEST STRIP: 25 days supply | Qty: 200 | Fill #0

## 2020-06-17 ENCOUNTER — Other Ambulatory Visit: Payer: Self-pay | Admitting: Oncology

## 2020-06-18 MED FILL — TAMOXIFEN 20 MG TABLET: 20 | 30 days supply | Qty: 30 | Fill #0

## 2020-06-20 ENCOUNTER — Other Ambulatory Visit: Payer: Self-pay | Admitting: Orthopedic Surgery

## 2020-06-27 ENCOUNTER — Other Ambulatory Visit: Payer: Self-pay | Admitting: *Deleted

## 2020-06-28 ENCOUNTER — Other Ambulatory Visit: Payer: Self-pay | Admitting: *Deleted

## 2020-06-28 ENCOUNTER — Other Ambulatory Visit: Payer: Self-pay

## 2020-06-28 ENCOUNTER — Encounter (HOSPITAL_BASED_OUTPATIENT_CLINIC_OR_DEPARTMENT_OTHER): Payer: Self-pay | Admitting: Orthopedic Surgery

## 2020-06-28 MED ORDER — TAMOXIFEN CITRATE 20 MG PO TABS
20.0000 mg | ORAL_TABLET | Freq: Every day | ORAL | 1 refills | Status: DC
Start: 2020-06-28 — End: 2021-12-12

## 2020-07-01 ENCOUNTER — Other Ambulatory Visit (HOSPITAL_COMMUNITY)
Admission: RE | Admit: 2020-07-01 | Discharge: 2020-07-01 | Disposition: A | Discharge status: Home / Self Care | Payer: 59 | Source: Ambulatory Visit | Attending: Orthopedic Surgery | Admitting: Orthopedic Surgery

## 2020-07-01 ENCOUNTER — Encounter (HOSPITAL_BASED_OUTPATIENT_CLINIC_OR_DEPARTMENT_OTHER)
Admission: RE | Admit: 2020-07-01 | Discharge: 2020-07-01 | Disposition: A | Discharge status: Home / Self Care | Payer: 59 | Source: Ambulatory Visit | Attending: Orthopedic Surgery | Admitting: Orthopedic Surgery

## 2020-07-01 DIAGNOSIS — Z20822 Contact with and (suspected) exposure to covid-19: Secondary | ICD-10-CM | POA: Insufficient documentation

## 2020-07-01 DIAGNOSIS — Z01818 Encounter for other preprocedural examination: Secondary | ICD-10-CM | POA: Insufficient documentation

## 2020-07-01 DIAGNOSIS — Z01812 Encounter for preprocedural laboratory examination: Secondary | ICD-10-CM | POA: Insufficient documentation

## 2020-07-01 LAB — BASIC METABOLIC PANEL
Anion gap: 10 (ref 5–15)
BUN: 12 mg/dL (ref 8–23)
CO2: 27 mmol/L (ref 22–32)
Calcium: 8.8 mg/dL — ABNORMAL LOW (ref 8.9–10.3)
Chloride: 105 mmol/L (ref 98–111)
Creatinine, Ser: 0.73 mg/dL (ref 0.44–1.00)
GFR, Estimated: >60 mL/min (ref >60)
Glucose, Bld: 198 mg/dL — ABNORMAL HIGH (ref 70–99)
Potassium: 4.5 mmol/L (ref 3.5–5.1)
Sodium: 142 mmol/L (ref 135–145)

## 2020-07-01 LAB — SARS CORONAVIRUS 2 (TAT 6-24 HRS): SARS Coronavirus 2: NEGATIVE

## 2020-07-01 NOTE — Progress Notes (Signed)

## 2020-07-04 ENCOUNTER — Ambulatory Visit (HOSPITAL_BASED_OUTPATIENT_CLINIC_OR_DEPARTMENT_OTHER): Payer: 59 | Admitting: Certified Registered"

## 2020-07-04 ENCOUNTER — Other Ambulatory Visit (HOSPITAL_BASED_OUTPATIENT_CLINIC_OR_DEPARTMENT_OTHER): Payer: Self-pay | Admitting: Orthopedic Surgery

## 2020-07-04 ENCOUNTER — Encounter (HOSPITAL_BASED_OUTPATIENT_CLINIC_OR_DEPARTMENT_OTHER): Payer: Self-pay | Admitting: Orthopedic Surgery

## 2020-07-04 ENCOUNTER — Ambulatory Visit (HOSPITAL_BASED_OUTPATIENT_CLINIC_OR_DEPARTMENT_OTHER)
Admission: RE | Admit: 2020-07-04 | Discharge: 2020-07-04 | Disposition: A | Discharge status: Home / Self Care | Payer: 59 | Attending: Orthopedic Surgery | Admitting: Orthopedic Surgery

## 2020-07-04 ENCOUNTER — Other Ambulatory Visit: Payer: Self-pay

## 2020-07-04 ENCOUNTER — Encounter (HOSPITAL_BASED_OUTPATIENT_CLINIC_OR_DEPARTMENT_OTHER)
Admission: RE | Disposition: A | Discharge status: Home / Self Care | Payer: Self-pay | Source: Home / Self Care | Attending: Orthopedic Surgery

## 2020-07-04 DIAGNOSIS — E039 Hypothyroidism, unspecified: Secondary | ICD-10-CM | POA: Diagnosis not present

## 2020-07-04 DIAGNOSIS — E119 Type 2 diabetes mellitus without complications: Secondary | ICD-10-CM | POA: Insufficient documentation

## 2020-07-04 DIAGNOSIS — M65841 Other synovitis and tenosynovitis, right hand: Secondary | ICD-10-CM | POA: Diagnosis not present

## 2020-07-04 HISTORY — PX: TRIGGER FINGER RELEASE: SHX641

## 2020-07-04 LAB — GLUCOSE, CAPILLARY
Glucose-Capillary: 159 mg/dL — ABNORMAL HIGH (ref 70–99)
Glucose-Capillary: 190 mg/dL — ABNORMAL HIGH (ref 70–99)

## 2020-07-04 SURGERY — RELEASE, A1 PULLEY, FOR TRIGGER FINGER
Anesthesia: Monitor Anesthesia Care | Site: Hand | Laterality: Right

## 2020-07-04 MED ORDER — LACTATED RINGERS IV SOLN: INTRAVENOUS | Status: DC | PRN

## 2020-07-04 MED ORDER — PROPOFOL 500 MG/50ML IV EMUL
INTRAVENOUS | Status: AC
  Filled 2020-07-04: qty 50

## 2020-07-04 MED ORDER — BUPIVACAINE HCL (PF) 0.25 % IJ SOLN
INTRAMUSCULAR | Status: DC | PRN
  Administered 2020-07-04: 7 mL

## 2020-07-04 MED ORDER — BUPIVACAINE HCL (PF) 0.25 % IJ SOLN
INTRAMUSCULAR | Status: AC
  Filled 2020-07-04: qty 60

## 2020-07-04 MED ORDER — LIDOCAINE HCL (PF) 0.5 % IJ SOLN
INTRAMUSCULAR | Status: DC | PRN
  Administered 2020-07-04: 35 mL via INTRAVENOUS

## 2020-07-04 MED ORDER — LIDOCAINE HCL (PF) 1 % IJ SOLN
INTRAMUSCULAR | Status: AC
  Filled 2020-07-04: qty 90

## 2020-07-04 MED ORDER — TRAMADOL HCL 50 MG PO TABS
50.0000 mg | ORAL_TABLET | Freq: Four times a day (QID) | ORAL | 0 refills | Status: DC | PRN
Start: 2020-07-04 — End: 2020-07-04

## 2020-07-04 MED ORDER — FENTANYL CITRATE (PF) 100 MCG/2ML IJ SOLN
INTRAMUSCULAR | Status: AC
  Filled 2020-07-04: qty 2

## 2020-07-04 MED ORDER — BUPIVACAINE HCL (PF) 0.5 % IJ SOLN
INTRAMUSCULAR | Status: AC
  Filled 2020-07-04: qty 30

## 2020-07-04 MED ORDER — MIDAZOLAM HCL 5 MG/5ML IJ SOLN
INTRAMUSCULAR | Status: DC | PRN
  Administered 2020-07-04: 2 mg via INTRAVENOUS

## 2020-07-04 MED ORDER — OXYCODONE HCL 5 MG/5ML PO SOLN: 5.0000 mg | Freq: Once | ORAL | Status: DC | PRN

## 2020-07-04 MED ORDER — ONDANSETRON HCL 4 MG/2ML IJ SOLN
INTRAMUSCULAR | Status: AC
  Filled 2020-07-04: qty 2

## 2020-07-04 MED ORDER — ONDANSETRON HCL 4 MG/2ML IJ SOLN
INTRAMUSCULAR | Status: DC | PRN
  Administered 2020-07-04: 4 mg via INTRAVENOUS

## 2020-07-04 MED ORDER — PROPOFOL 500 MG/50ML IV EMUL
INTRAVENOUS | Status: DC | PRN
  Administered 2020-07-04: 75 ug/kg/min via INTRAVENOUS

## 2020-07-04 MED ORDER — LACTATED RINGERS IV SOLN: INTRAVENOUS | Status: DC

## 2020-07-04 MED ORDER — FENTANYL CITRATE (PF) 100 MCG/2ML IJ SOLN: 25.0000 ug | INTRAMUSCULAR | Status: DC | PRN

## 2020-07-04 MED ORDER — CEFAZOLIN SODIUM-DEXTROSE 2-4 GM/100ML-% IV SOLN: 2.0000 g | INTRAVENOUS | Status: DC

## 2020-07-04 MED ORDER — CEFAZOLIN SODIUM-DEXTROSE 2-4 GM/100ML-% IV SOLN
INTRAVENOUS | Status: AC
  Filled 2020-07-04: qty 100

## 2020-07-04 MED ORDER — MIDAZOLAM HCL 2 MG/2ML IJ SOLN
INTRAMUSCULAR | Status: AC
  Filled 2020-07-04: qty 2

## 2020-07-04 MED ORDER — FENTANYL CITRATE (PF) 100 MCG/2ML IJ SOLN
INTRAMUSCULAR | Status: DC | PRN
  Administered 2020-07-04 (×2): 50 ug via INTRAVENOUS

## 2020-07-04 MED ORDER — 0.9 % SODIUM CHLORIDE (POUR BTL) OPTIME
TOPICAL | Status: DC | PRN
  Administered 2020-07-04: 1000 mL

## 2020-07-04 MED ORDER — ONDANSETRON HCL 4 MG/2ML IJ SOLN: 4.0000 mg | Freq: Four times a day (QID) | INTRAMUSCULAR | Status: DC | PRN

## 2020-07-04 MED ORDER — DEXAMETHASONE SODIUM PHOSPHATE 10 MG/ML IJ SOLN
INTRAMUSCULAR | Status: AC
  Filled 2020-07-04: qty 1

## 2020-07-04 MED ORDER — OXYCODONE HCL 5 MG PO TABS: 5.0000 mg | ORAL_TABLET | Freq: Once | ORAL | Status: DC | PRN

## 2020-07-04 MED FILL — traMADol HCL 50 MG TABS: 50 | 5 days supply | Qty: 20 | Fill #0

## 2020-07-04 SURGICAL SUPPLY — 33 items
APL PRP STRL LF DISP 70% ISPRP (MISCELLANEOUS) ×1
BLADE SURG 15 STRL LF DISP TIS (BLADE) ×1 IMPLANT
BLADE SURG 15 STRL SS (BLADE) ×2
BNDG CMPR 9X4 STRL LF SNTH (GAUZE/BANDAGES/DRESSINGS)
BNDG COHESIVE 2X5 TAN STRL LF (GAUZE/BANDAGES/DRESSINGS) ×2 IMPLANT
BNDG ESMARK 4X9 LF (GAUZE/BANDAGES/DRESSINGS) IMPLANT
CHLORAPREP W/TINT 26 (MISCELLANEOUS) ×2 IMPLANT
CORD BIPOLAR FORCEPS 12FT (ELECTRODE) IMPLANT
COVER BACK TABLE 60X90IN (DRAPES) ×2 IMPLANT
COVER MAYO STAND STRL (DRAPES) ×2 IMPLANT
COVER WAND RF STERILE (DRAPES) IMPLANT
CUFF TOURN SGL QUICK 18X4 (TOURNIQUET CUFF) IMPLANT
DECANTER SPIKE VIAL GLASS SM (MISCELLANEOUS) IMPLANT
DRAPE EXTREMITY T 121X128X90 (DISPOSABLE) ×2 IMPLANT
DRAPE SURG 17X23 STRL (DRAPES) ×2 IMPLANT
GAUZE SPONGE 4X4 12PLY STRL (GAUZE/BANDAGES/DRESSINGS) ×2 IMPLANT
GAUZE XEROFORM 1X8 LF (GAUZE/BANDAGES/DRESSINGS) ×2 IMPLANT
GLOVE BIOGEL PI IND STRL 8.5 (GLOVE) ×1 IMPLANT
GLOVE BIOGEL PI INDICATOR 8.5 (GLOVE) ×1
GLOVE SURG ORTHO 8.0 STRL STRW (GLOVE) ×2 IMPLANT
GOWN STRL REUS W/ TWL LRG LVL3 (GOWN DISPOSABLE) ×1 IMPLANT
GOWN STRL REUS W/TWL LRG LVL3 (GOWN DISPOSABLE) ×2
GOWN STRL REUS W/TWL XL LVL3 (GOWN DISPOSABLE) ×2 IMPLANT
NDL PRECISIONGLIDE 27X1.5 (NEEDLE) ×1 IMPLANT
NEEDLE PRECISIONGLIDE 27X1.5 (NEEDLE) ×2 IMPLANT
NS IRRIG 1000ML POUR BTL (IV SOLUTION) ×2 IMPLANT
PACK BASIN DAY SURGERY FS (CUSTOM PROCEDURE TRAY) ×2 IMPLANT
STOCKINETTE 4X48 STRL (DRAPES) ×2 IMPLANT
SUT ETHILON 4 0 PS 2 18 (SUTURE) ×2 IMPLANT
SYR BULB EAR ULCER 3OZ GRN STR (SYRINGE) ×2 IMPLANT
SYR CONTROL 10ML LL (SYRINGE) ×2 IMPLANT
TOWEL GREEN STERILE FF (TOWEL DISPOSABLE) ×4 IMPLANT
UNDERPAD 30X36 HEAVY ABSORB (UNDERPADS AND DIAPERS) ×2 IMPLANT

## 2020-07-04 NOTE — Discharge Instructions (Addendum)

## 2020-07-04 NOTE — H&P (Signed)
Tiffany Browning is an 64 y.o. female.   Chief Complaint:catching right middle finger HPI: Tiffany Browning is a 64 yo female complaining of her right middle finger catching. This been injected on 2 occasions in the past and she is not complain of any numbness or tingling. She has undergone carpal tunnel release in the past. She has a history of diabetes with a history of thyroid problems no history of arthritis or gout. Family history is positive arthritis negative for the remainder.   Past Medical History:  Diagnosis Date  . Allergy   . Asthma    under control  . Breast cancer ([place])   . Diabetes mellitus    Type 1-on insulin pump  . Family history of breast cancer   . Family history of kidney cancer   . Heart murmur   . Hypothyroidism   . Personal history of radiation therapy   . Thyroid disease    Hypothyroidism    Past Surgical History:  Procedure Laterality Date  . ABDOMINAL HYSTERECTOMY    . APPENDECTOMY    . BREAST BIOPSY Right 02/14/2016  . BREAST BIOPSY Right 01/09/2016  . BREAST LUMPECTOMY Right 03/26/2016  . BREAST LUMPECTOMY WITH AXILLARY LYMPH NODE BIOPSY Right 03/26/2016   Procedure: RIGHT BREAST LUMPECTOMY WITH AXILLARY LYMPH NODE BIOPSY;  Surgeon: Autumn Messing III, MD;  Location: [facility];  Service: General;  Laterality: Right;  . CARPAL TUNNEL RELEASE Right 02/09/2019   Procedure: RIGHT CARPAL TUNNEL RELEASE;  Surgeon: Daryll Brod, MD;  Location: [facility];  Service: Orthopedics;  Laterality: Right;  . NASAL SINUS SURGERY    . ruptured ovarian cyst    . SHOULDER ARTHROSCOPY  2004   b/L --second one 2011  . TONSILECTOMY, ADENOIDECTOMY, BILATERAL MYRINGOTOMY AND TUBES    . TUBAL LIGATION     then reversed it    Family History  Problem Relation Age of Onset  . Cancer Mother        cervical  . Hypertension Mother   . Hyperlipidemia Mother   . Heart disease Father 37       MI---- cabg before that  . Hypothyroidism Sister   . Breast cancer Sister 80  .  Heart disease Brother 51       MI--- second one 21  . Hyperlipidemia Brother   . Hypertension Brother   . Hypothyroidism Sister   . Kidney cancer Other 41       son of her sister with breast cancer   Social History:  reports that she has never smoked. She has never used smokeless tobacco. She reports current alcohol use. She reports that she does not use drugs.  Allergies: No Known Allergies  No medications prior to admission.    No results found for this or any previous visit (from the past 48 hour(s)).  No results found.   Pertinent items are noted in HPI.  Height 5\' 1"  (1.549 m), weight 58.1 kg.  General appearance: alert, cooperative and appears stated age Head: Normocephalic, without obvious abnormality Neck: no JVD Resp: clear to auscultation bilaterally Cardio: regular rate and rhythm, S1, S2 normal, no murmur, click, rub or gallop GI: soft, non-tender; bowel sounds normal; no masses,  no organomegaly Extremities: catching right middle finger Pulses: 2+ and symmetric Skin: Skin color, texture, turgor normal. No rashes or lesions Neurologic: Grossly normal Incision/Wound: na  Assessment/Plan Diagnose stenosing tenosynovitis right middle finger  Plan: She would like to go ahead and have this surgically released. She  is scheduled for release trigger finger right middle finger as an outpatient under regional anesthesia. Prepare postoperative course been discussed along with risk and complications. She is aware there is no guarantee to the surgery the possibility of infection recurrence injury to arteries nerves tendons complete relief symptoms dystrophy. This will be scheduled as an outpatient under forearm IV regional    Daryll Brod 07/04/2020, 5:55 AM

## 2020-07-04 NOTE — Brief Op Note (Signed)
07/04/2020  9:11 AM  PATIENT:  Tiffany Browning  64 y.o. female  PRE-OPERATIVE DIAGNOSIS:  TRIGGER RIGHT MIDDLE FINGER  POST-OPERATIVE DIAGNOSIS:  TRIGGER RIGHT MIDDLE FINGER  PROCEDURE:  Procedure(s) with comments: RELEASE TRIGGER FINGER/A-1 PULLEY (Right) - IV REGIONAL FOREARM BLOCK  SURGEON:  Surgeon(s) and Role:    * Daryll Brod, MD - Primary  PHYSICIAN ASSISTANT:   ASSISTANTS: none   ANESTHESIA:   local, regional and IV sedation  EBL:  74ml   BLOOD ADMINISTERED:none  DRAINS: none   LOCAL MEDICATIONS USED:  BUPIVICAINE   SPECIMEN:  No Specimen  DISPOSITION OF SPECIMEN:  N/A  COUNTS:  YES  TOURNIQUET:   Total Tourniquet Time Documented: Forearm (Right) - 18 minutes Total: Forearm (Right) - 18 minutes   DICTATION: .Dragon Dictation  PLAN OF CARE: Discharge to home after PACU  PATIENT DISPOSITION:  PACU - hemodynamically stable.

## 2020-07-04 NOTE — Op Note (Signed)
NAME: Tiffany Browning RECORD NO: [id number] DATE OF BIRTH: 02-18-1956 FAC[facility] LOCATION: [facility] PHYSICIAN: Wynonia Sours, MD   OPERATIVE REPORT   DATE OF PROCEDURE: 07/04/20    PREOPERATIVE DIAGNOSIS:   Stenosing tenosynovitis right middle finger   POSTOPERATIVE DIAGNOSIS:   Same   PROCEDURE:   Release A1 pulley right middle finger   SURGEON: Daryll Brod, M.D.   ASSISTANT: none   ANESTHESIA:  Bier block with sedation and Local   INTRAVENOUS FLUIDS:  Per anesthesia flow sheet.   ESTIMATED BLOOD LOSS:  Minimal.   COMPLICATIONS:  None.   SPECIMENS:  none   TOURNIQUET TIME:    Total Tourniquet Time Documented: Forearm (Right) - 18 minutes Total: Forearm (Right) - 18 minutes    DISPOSITION:  Stable to PACU.   INDICATIONS: Patient is a 64 year old female with a history of catching of her right middle finger.  This not responded to conservative treatment she has had these in the past and is elected to undergo surgical release.  Preperi-and postoperative course been discussed along with risks and complications.  She is aware that there is no guarantee to the surgery the possibility of infection recurrence injury to arteries nerves tendons incomplete relief symptoms dystrophy.  Preoperative area the patient seen the extremity marked by both patient and surgeon antibiotic given  OPERATIVE COURSE: Patient is brought the operating placed in the supine position with the right arm free.  3-minute dry time was allowed after a prep was done with ChloraPrep.  A timeout was taken to confirm patient procedure.  A forearm IV regional anesthetic had been established by the anesthesia department prior to the prep.  A an oblique incision was made over the A1 pulley of the right thumb carried down through subcutaneous tissue.  Neurovascular structures were identified and protected with retractors.  The A1 pulley was found to be markedly thickened.  This was  released on its radial aspect.  A small incision was made centrally in the A2 pulley.  Tenosynovial tissue proximally was separated with blunt dissection.  The 2 tendons were then retracted separating them breaking any adhesions.  The finger was placed through a full passive range of motion without any further triggering.  The wound was copiously irrigated with saline.  Skin was closed erupted 4-0 nylon sutures.  Local infiltration with quarter percent bupivacaine without epinephrine was given approximately 7 cc was used.  A sterile compressive dressing was applied.  Inflation of the tourniquet all fingers immediately pink.  She was taken to the recovery room for observation in satisfactory condition.  She will be discharged home to her return to the hand center [facility] in 1 week on Tylenol ibuprofen for pain with Ultram for breakthrough.   Daryll Brod, MD Electronically signed, 07/04/20

## 2020-07-04 NOTE — Anesthesia Procedure Notes (Signed)
Anesthesia Regional Block: Bier block (IV Regional)   Pre-Anesthetic Checklist: ,, timeout performed, Correct Patient, Correct Site, Correct Laterality, Correct Procedure, Correct Position, site marked, Risks and benefits discussed,  Surgical consent,  Pre-op evaluation,  At surgeon's request and post-op pain management  Laterality: Right  Prep: alcohol swabs        Procedures:,,,,,, Esmarch exsanguination, single tourniquet utilized, #20gu IV placed  Narrative:  CRNA: Verita Lamb, CRNA

## 2020-07-04 NOTE — Anesthesia Preprocedure Evaluation (Signed)
Anesthesia Evaluation  Patient identified by MRN, date of birth, ID band Patient awake    Reviewed: Allergy & Precautions, H&P , NPO status , Patient's Chart, lab work & pertinent test results  Airway Mallampati: II   Neck ROM: full    Dental   Pulmonary asthma ,    breath sounds clear to auscultation       Cardiovascular negative cardio ROS   Rhythm:regular Rate:Normal     Neuro/Psych    GI/Hepatic   Endo/Other  diabetes, Type 2Hypothyroidism   Renal/GU      Musculoskeletal   Abdominal   Peds  Hematology   Anesthesia Other Findings   Reproductive/Obstetrics                             Anesthesia Physical Anesthesia Plan  ASA: II  Anesthesia Plan: Bier Block and MAC and Bier Block-LIDOCAINE ONLY   Post-op Pain Management:    Induction: Intravenous  PONV Risk Score and Plan: 2 and Ondansetron, Propofol infusion, Midazolam and Treatment may vary due to age or medical condition  Airway Management Planned: Simple Face Mask  Additional Equipment:   Intra-op Plan:   Post-operative Plan:   Informed Consent: I have reviewed the patients History and Physical, chart, labs and discussed the procedure including the risks, benefits and alternatives for the proposed anesthesia with the patient or authorized representative who has indicated his/her understanding and acceptance.       Plan Discussed with: CRNA, Anesthesiologist and Surgeon  Anesthesia Plan Comments:         Anesthesia Quick Evaluation

## 2020-07-04 NOTE — Transfer of Care (Signed)
Immediate Anesthesia Transfer of Care Note  Patient: Tiffany Browning  Procedure(s) Performed: RELEASE TRIGGER FINGER/A-1 PULLEY (Right Hand)  Patient Location: PACU  Anesthesia Type:MAC and Bier block  Level of Consciousness: awake, alert  and oriented  Airway & Oxygen Therapy: Patient Spontanous Breathing and Patient connected to face mask oxygen  Post-op Assessment: Report given to RN and Post -op Vital signs reviewed and stable  Post vital signs: Reviewed and stable  Last Vitals:  Vitals Value Taken Time  BP    Temp    Pulse 55 07/04/20 0912  Resp 16 07/04/20 0912  SpO2 94 % 07/04/20 0912  Vitals shown include unvalidated device data.  Last Pain:  Vitals:   07/04/20 0724  TempSrc: Oral  PainSc: 0-No pain         Complications: No complications documented.

## 2020-07-04 NOTE — Anesthesia Postprocedure Evaluation (Signed)
Anesthesia Post Note  Patient: Tiffany Browning  Procedure(s) Performed: RELEASE TRIGGER FINGER/A-1 PULLEY (Right Hand)     Patient location during evaluation: PACU Anesthesia Type: MAC and Bier Block Level of consciousness: awake and alert Pain management: pain level controlled Vital Signs Assessment: post-procedure vital signs reviewed and stable Respiratory status: spontaneous breathing, nonlabored ventilation, respiratory function stable and patient connected to nasal cannula oxygen Cardiovascular status: stable and blood pressure returned to baseline Postop Assessment: no apparent nausea or vomiting Anesthetic complications: no   No complications documented.  Last Vitals:  Vitals:   07/04/20 0945 07/04/20 1005  BP: (!) 137/47 (!) 151/71  Pulse: (!) 53 (!) 54  Resp: 13 14  Temp:  36.6 C  SpO2: 96% 94%    Last Pain:  Vitals:   07/04/20 1005  TempSrc:   PainSc: 0-No pain                 Annelie Boak S

## 2020-07-05 ENCOUNTER — Encounter (HOSPITAL_BASED_OUTPATIENT_CLINIC_OR_DEPARTMENT_OTHER): Payer: Self-pay | Admitting: Orthopedic Surgery

## 2020-07-13 MED FILL — VENLAFAXINE HCL ER 75 MG CA: 75 | 30 days supply | Qty: 30 | Fill #0

## 2020-07-15 ENCOUNTER — Other Ambulatory Visit (HOSPITAL_COMMUNITY): Payer: Self-pay | Admitting: Endocrinology

## 2020-07-15 MED FILL — ACCU-CHEK GUIDE TEST STRIP: 25 days supply | Qty: 200 | Fill #0

## 2020-07-19 ENCOUNTER — Other Ambulatory Visit (HOSPITAL_BASED_OUTPATIENT_CLINIC_OR_DEPARTMENT_OTHER): Payer: Self-pay | Admitting: Internal Medicine

## 2020-07-19 ENCOUNTER — Ambulatory Visit: Payer: 59 | Attending: Internal Medicine

## 2020-07-19 DIAGNOSIS — Z23 Encounter for immunization: Secondary | ICD-10-CM

## 2020-07-19 NOTE — Progress Notes (Signed)
   Covid-19 Vaccination Clinic  Name:  Tiffany Browning    MRN: [id number] DOB: September 15, 1955  07/19/2020  Tiffany Browning was observed post Covid-19 immunization for 15 minutes without incident. She was provided with Vaccine Information Sheet and instruction to access the V-Safe system.   Tiffany Browning was instructed to call 911 with any severe reactions post vaccine: [name] Difficulty breathing  . Swelling of face and throat  . A fast heartbeat  . A bad rash all over body  . Dizziness and weakness   Immunizations Administered    Name Date Dose VIS Date Route   Pfizer COVID-19 Vaccine 07/19/2020 12:57 PM 0.3 mL 06/05/2020 Intramuscular   Manufacturer: [place]   Lot: [id number]   [place]: [id number]

## 2020-07-23 MED FILL — PFIZER-BIONTECH COVID-19 VA: 30 | 1 days supply | Qty: 0 | Fill #0

## 2020-07-27 MED FILL — TAMOXIFEN 20 MG TABLET: 20 | 30 days supply | Qty: 30 | Fill #1

## 2020-08-12 MED FILL — VENLAFAXINE HCL ER 75 MG CA: 75 | 30 days supply | Qty: 30 | Fill #1

## 2020-08-12 MED FILL — ACCU-CHEK GUIDE TEST STRIP: 25 days supply | Qty: 200 | Fill #1

## 2020-08-30 DIAGNOSIS — E039 Hypothyroidism, unspecified: Secondary | ICD-10-CM | POA: Diagnosis not present

## 2020-08-30 DIAGNOSIS — E1042 Type 1 diabetes mellitus with diabetic polyneuropathy: Secondary | ICD-10-CM | POA: Diagnosis not present

## 2020-08-30 DIAGNOSIS — Z0000 Encounter for general adult medical examination without abnormal findings: Secondary | ICD-10-CM | POA: Diagnosis not present

## 2020-09-05 MED FILL — TAMOXIFEN 20 MG TABLET: 20 | 90 days supply | Qty: 90 | Fill #2

## 2020-09-12 ENCOUNTER — Other Ambulatory Visit: Payer: Self-pay | Admitting: Oncology

## 2020-09-12 ENCOUNTER — Other Ambulatory Visit: Payer: Self-pay | Admitting: *Deleted

## 2020-09-12 MED ORDER — VENLAFAXINE HCL ER 75 MG PO CP24: 75.0000 mg | ORAL_CAPSULE | Freq: Every day | ORAL | 1 refills | Status: DC

## 2020-09-12 MED FILL — VENLAFAXINE HCL ER 75 MG CA: 75 | 90 days supply | Qty: 90 | Fill #0

## 2020-09-12 MED FILL — LEVOTHYROXINE 175 MCG TAB: 175 | 90 days supply | Qty: 78 | Fill #0

## 2020-09-12 MED FILL — ROSUVASTATIN CALCIUM 5 MG T: 5 | 90 days supply | Qty: 90 | Fill #0

## 2020-09-16 ENCOUNTER — Other Ambulatory Visit (HOSPITAL_COMMUNITY): Payer: Self-pay | Admitting: Endocrinology

## 2020-09-16 MED FILL — FREESTYLE LITE TEST STRIP: 83 days supply | Qty: 500 | Fill #0

## 2020-09-16 MED FILL — FREESTYLE LITE METER: W/DEVICE | 1 days supply | Qty: 1 | Fill #0

## 2020-09-26 DIAGNOSIS — Z794 Long term (current) use of insulin: Secondary | ICD-10-CM | POA: Diagnosis not present

## 2020-09-26 DIAGNOSIS — Z0000 Encounter for general adult medical examination without abnormal findings: Secondary | ICD-10-CM | POA: Diagnosis not present

## 2020-09-26 DIAGNOSIS — Z1339 Encounter for screening examination for other mental health and behavioral disorders: Secondary | ICD-10-CM | POA: Diagnosis not present

## 2020-09-26 DIAGNOSIS — E039 Hypothyroidism, unspecified: Secondary | ICD-10-CM | POA: Diagnosis not present

## 2020-09-26 DIAGNOSIS — Z1331 Encounter for screening for depression: Secondary | ICD-10-CM | POA: Diagnosis not present

## 2020-09-26 DIAGNOSIS — Z4681 Encounter for fitting and adjustment of insulin pump: Secondary | ICD-10-CM | POA: Diagnosis not present

## 2020-09-26 DIAGNOSIS — J45909 Unspecified asthma, uncomplicated: Secondary | ICD-10-CM | POA: Diagnosis not present

## 2020-09-26 DIAGNOSIS — C50411 Malignant neoplasm of upper-outer quadrant of right female breast: Secondary | ICD-10-CM | POA: Diagnosis not present

## 2020-09-26 DIAGNOSIS — B351 Tinea unguium: Secondary | ICD-10-CM | POA: Diagnosis not present

## 2020-09-26 DIAGNOSIS — E1042 Type 1 diabetes mellitus with diabetic polyneuropathy: Secondary | ICD-10-CM | POA: Diagnosis not present

## 2020-09-26 DIAGNOSIS — Z1212 Encounter for screening for malignant neoplasm of rectum: Secondary | ICD-10-CM | POA: Diagnosis not present

## 2020-10-13 ENCOUNTER — Other Ambulatory Visit: Payer: Self-pay

## 2020-10-13 ENCOUNTER — Encounter: Payer: Self-pay | Admitting: Emergency Medicine

## 2020-10-13 ENCOUNTER — Emergency Department (INDEPENDENT_AMBULATORY_CARE_PROVIDER_SITE_OTHER)
Admission: EM | Admit: 2020-10-13 | Discharge: 2020-10-13 | Disposition: A | Discharge status: Home / Self Care | Payer: 59 | Source: Home / Self Care | Attending: Family Medicine | Admitting: Family Medicine

## 2020-10-13 DIAGNOSIS — M778 Other enthesopathies, not elsewhere classified: Secondary | ICD-10-CM

## 2020-10-13 MED ORDER — NAPROXEN 375 MG PO TABS: 375.0000 mg | ORAL_TABLET | Freq: Three times a day (TID) | ORAL | 0 refills | Status: DC

## 2020-10-13 NOTE — ED Provider Notes (Signed)
[facility]    CSN: [id number] Arrival date & time: 10/13/20  1118      History   Chief Complaint Chief Complaint  Patient presents with  . Wrist Pain    HPI Tiffany Browning is a 65 y.o. female.   HPI   Patient is here for wrist pain.  She works as a Quarry manager in the hospital.  She does not recall any specific accident or injury.  Awoke today with significant pain in her left wrist.  She is an insulin-dependent diabetic.  Has had carpal tunnel release in her right wrist but no problems in her left wrist.  Pain is in the radial aspect of the wrist but does not radiate into the thumb index or long fingers.  Pain with movement of the wrist. Patient keeps her diabetes well controlled.  A1c is in the 6 range.  No kidney disease.  Past Medical History:  Diagnosis Date  . Allergy   . Asthma    under control  . Breast cancer ([place])   . Diabetes mellitus    Type 1-on insulin pump  . Family history of breast cancer   . Family history of kidney cancer   . Heart murmur   . Hypothyroidism   . Personal history of radiation therapy   . Thyroid disease    Hypothyroidism    Patient Active Problem List   Diagnosis Date Noted  . Pansinusitis 12/21/2016  . Bronchitis 10/31/2016  . Family history of breast cancer   . Family history of kidney cancer   . Breast cancer of upper-outer quadrant of right female breast ([place]) 01/28/2016  . Sinusitis, acute maxillary 07/11/2014  . Wheezing 07/11/2014  . VAGINITIS, CANDIDAL 02/06/2010  . VAGINITIS, BACTERIAL 02/06/2010  . DYSURIA 09/20/2007  . HYPOTHYROIDISM 02/21/2007  . DIABETES MELLITUS, TYPE I 02/21/2007  . EXTERNAL OTITIS 02/21/2007  . ALLERGIC RHINITIS 02/21/2007  . ASTHMA 02/21/2007    Past Surgical History:  Procedure Laterality Date  . ABDOMINAL HYSTERECTOMY    . APPENDECTOMY    . BREAST BIOPSY Right 02/14/2016  . BREAST BIOPSY Right 01/09/2016  . BREAST LUMPECTOMY Right 03/26/2016  . BREAST LUMPECTOMY WITH AXILLARY  LYMPH NODE BIOPSY Right 03/26/2016   Procedure: RIGHT BREAST LUMPECTOMY WITH AXILLARY LYMPH NODE BIOPSY;  Surgeon: Autumn Messing III, MD;  Location: [facility];  Service: General;  Laterality: Right;  . CARPAL TUNNEL RELEASE Right 02/09/2019   Procedure: RIGHT CARPAL TUNNEL RELEASE;  Surgeon: Daryll Brod, MD;  Location: [facility];  Service: Orthopedics;  Laterality: Right;  . NASAL SINUS SURGERY    . ruptured ovarian cyst    . SHOULDER ARTHROSCOPY  2004   b/L --second one 2011  . TONSILECTOMY, ADENOIDECTOMY, BILATERAL MYRINGOTOMY AND TUBES    . TRIGGER FINGER RELEASE Right 07/04/2020   Procedure: RELEASE TRIGGER FINGER/A-1 PULLEY;  Surgeon: Daryll Brod, MD;  Location: [facility];  Service: Orthopedics;  Laterality: Right;  IV REGIONAL FOREARM BLOCK  . TUBAL LIGATION     then reversed it    OB History   No obstetric history on file.      Home Medications    Prior to Admission medications   Medication Sig Start Date End Date Taking? Authorizing Provider  Alpha-Lipoic Acid 300 MG CAPS Take by mouth daily.   Yes [provider]  b complex vitamins tablet Take 1 tablet by mouth daily.   Yes [provider]  Biotin 10000 MCG TABS Take 1 tablet  by mouth daily.   Yes [provider]  Calcium-Magnesium-Vitamin D (CALCIUM MAGNESIUM PO) Take 1 tablet by mouth daily.   Yes [provider]  Cinnamon 500 MG capsule Take 500 mg by mouth daily.   Yes [provider]  fish oil-omega-3 fatty acids 1000 MG capsule Take 1 g by mouth daily.   Yes [provider]  insulin aspart (NOVOLOG) 100 UNIT/ML injection Per Dr Forde Dandy Patient taking differently: Inject 0.3-0.4 Units into the skin continuous. Insulin pump-Per Dr Forde Dandy 11/21/13  Yes Carollee Herter, Alferd Apa, DO  levothyroxine (SYNTHROID, LEVOTHROID) 175 MCG tablet Take 175 mcg by mouth daily. Take all days except on Sunday   Yes [provider]  montelukast (SINGULAIR) 10 MG  tablet Take 10 mg by mouth at bedtime.   Yes [provider]  Multiple Vitamin (MULTIVITAMIN) tablet Take 1 tablet by mouth daily.   Yes [provider]  naproxen (NAPROSYN) 375 MG tablet Take 1 tablet (375 mg total) by mouth 3 (three) times daily with meals. 10/13/20  Yes Raylene Everts, MD  ONE TOUCH ULTRA TEST test strip TEST 8 TIMES DAILY OR AS DIRECTED 08/11/16  Yes [provider]  rosuvastatin (CRESTOR) 5 MG tablet Take 5 mg by mouth daily. 12/02/17  Yes [provider]  tamoxifen (NOLVADEX) 20 MG tablet Take 1 tablet (20 mg total) by mouth daily. 06/28/20  Yes [name], [name], MD  traMADol (ULTRAM) 50 MG tablet Take 1 tablet (50 mg total) by mouth every 6 (six) hours as needed. 07/04/20  Yes Daryll Brod, MD  venlafaxine XR (EFFEXOR-XR) 75 MG 24 hr capsule Take 1 capsule (75 mg total) by mouth daily with breakfast. 09/12/20  Yes [name], [name], MD    Family History Family History  Problem Relation Age of Onset  . Cancer Mother        cervical  . Hypertension Mother   . Hyperlipidemia Mother   . Heart disease Father 76       MI---- cabg before that  . Hypothyroidism Sister   . Breast cancer Sister 3  . Heart disease Brother 65       MI--- second one 45  . Hyperlipidemia Brother   . Hypertension Brother   . Hypothyroidism Sister   . Kidney cancer Other 57       son of her sister with breast cancer    Social History Social History   Tobacco Use  . Smoking status: Never Smoker  . Smokeless tobacco: Never Used  Vaping Use  . Vaping Use: Never used  Substance Use Topics  . Alcohol use: Yes    Comment: occ  . Drug use: No     Allergies   Patient has no known allergies.   Review of Systems Review of Systems See HPI  Physical Exam Triage Vital Signs ED Triage Vitals  Enc Vitals Group     BP 10/13/20 1131 139/72     Pulse Rate 10/13/20 1131 79     Resp --      Temp --      Temp src --      SpO2 10/13/20 1131 96 %      Weight --      Height --      Head Circumference --      Peak Flow --      Pain Score 10/13/20 1132 2     Pain Loc --      Pain Edu? --  Excl. in GC? --    No data found.  Updated Vital Signs BP 139/72 (BP Location: Right Arm)   Pulse 79   SpO2 96%  Physical Exam Constitutional:      General: She is not in acute distress.    Appearance: She is well-developed and well-nourished.     Comments: Holds left wrist as if uncomfortable.  Pleasant.  No acute distress  HENT:     Head: Normocephalic and atraumatic.     Ears:     Comments: Mask is in place    Mouth/Throat:     Mouth: Oropharynx is clear and moist.  Eyes:     Conjunctiva/sclera: Conjunctivae normal.     Pupils: Pupils are equal, round, and reactive to light.  Cardiovascular:     Rate and Rhythm: Normal rate.  Pulmonary:     Effort: Pulmonary effort is normal. No respiratory distress.  Abdominal:     General: There is no distension.     Palpations: Abdomen is soft.  Musculoskeletal:        General: No edema. Normal range of motion.     Left wrist: Swelling and tenderness present.     Cervical back: Normal range of motion.     Comments: Left wrist has tenderness over the carpal region, radial aspect.  Soft tissue swelling diffusely across the volar region of the wrist.  Pain with Finkelstein's.  No pain with flexion or extension.  Grip strength is slightly diminished.  No Phalen's or tingling with Tinel's.  Skin:    General: Skin is warm and dry.  Neurological:     Mental Status: She is alert.  Psychiatric:        Behavior: Behavior normal.      UC Treatments / Results  Labs (all labs ordered are listed, but only abnormal results are displayed) Labs Reviewed - No data to display  EKG   Radiology No results found.  Procedures Procedures (including critical care time)  Medications Ordered in UC Medications - No data to display  Initial Impression / Assessment and Plan / UC Course  I have  reviewed the triage vital signs and the nursing notes.  Pertinent labs & imaging results that were available during my care of the patient were reviewed by me and considered in my medical decision making (see chart for details).     Final Clinical Impressions(s) / UC Diagnoses   Final diagnoses:  Tendinitis of wrist     Discharge Instructions     Wear brace to support wrist Take naproxen 2 times a day with food.  May take a third dose if needed Use ice for 20 minutes every 2-4 hours  See your primary care doctor if you fail to improve over a week or 2   ED Prescriptions    Medication Sig Dispense Auth. Provider   naproxen (NAPROSYN) 375 MG tablet Take 1 tablet (375 mg total) by mouth 3 (three) times daily with meals. 30 tablet Raylene Everts, MD     PDMP not reviewed this encounter.   Raylene Everts, MD 10/13/20 1215

## 2020-10-13 NOTE — ED Triage Notes (Signed)
Patient c/o left wrist pain this am, no apparent injury.  Patient hasn't taken anything for pain.  Some swelling, no redness.

## 2020-10-13 NOTE — Discharge Instructions (Signed)
Wear brace to support wrist Take naproxen 2 times a day with food.  May take a third dose if needed Use ice for 20 minutes every 2-4 hours  See your primary care doctor if you fail to improve over a week or 2

## 2020-10-18 ENCOUNTER — Ambulatory Visit: Payer: 59 | Admitting: Podiatry

## 2020-10-29 ENCOUNTER — Other Ambulatory Visit (HOSPITAL_COMMUNITY): Payer: Self-pay | Admitting: Endocrinology

## 2020-11-05 ENCOUNTER — Other Ambulatory Visit (HOSPITAL_BASED_OUTPATIENT_CLINIC_OR_DEPARTMENT_OTHER): Payer: Self-pay

## 2020-11-22 ENCOUNTER — Other Ambulatory Visit (HOSPITAL_COMMUNITY): Payer: Self-pay

## 2020-12-02 MED FILL — Tamoxifen Citrate Tab 20 MG (Base Equivalent): ORAL | 90 days supply | Qty: 90 | Fill #0 | Status: AC

## 2020-12-03 ENCOUNTER — Other Ambulatory Visit (HOSPITAL_COMMUNITY): Payer: Self-pay

## 2020-12-03 MED FILL — Venlafaxine HCl Cap ER 24HR 75 MG (Base Equivalent): ORAL | 90 days supply | Qty: 90 | Fill #0 | Status: AC

## 2020-12-04 ENCOUNTER — Other Ambulatory Visit (HOSPITAL_COMMUNITY): Payer: Self-pay

## 2020-12-06 ENCOUNTER — Ambulatory Visit (INDEPENDENT_AMBULATORY_CARE_PROVIDER_SITE_OTHER): Payer: 59 | Admitting: Podiatry

## 2020-12-06 ENCOUNTER — Other Ambulatory Visit (HOSPITAL_COMMUNITY): Payer: Self-pay

## 2020-12-06 ENCOUNTER — Other Ambulatory Visit: Payer: Self-pay | Admitting: Podiatry

## 2020-12-06 ENCOUNTER — Encounter: Payer: Self-pay | Admitting: Podiatry

## 2020-12-06 ENCOUNTER — Other Ambulatory Visit: Payer: Self-pay

## 2020-12-06 DIAGNOSIS — M722 Plantar fascial fibromatosis: Secondary | ICD-10-CM

## 2020-12-06 DIAGNOSIS — B351 Tinea unguium: Secondary | ICD-10-CM

## 2020-12-06 MED ORDER — EFINACONAZOLE 10 % EX SOLN
1.0000 [drp] | Freq: Every day | CUTANEOUS | 11 refills | Status: DC
Start: 2020-12-06 — End: 2020-12-06
  Filled 2020-12-06: qty 4, 30d supply, fill #0

## 2020-12-06 MED ORDER — EFINACONAZOLE 10 % EX SOLN: 1.0000 [drp] | Freq: Every day | CUTANEOUS | 11 refills | Status: DC

## 2020-12-06 NOTE — Telephone Encounter (Signed)
Jublia was not covered so I sent it to [facility] in [place] to see if they can get it any cheaper. If not, we can try another topical. Please let her know. Thanks.

## 2020-12-06 NOTE — Patient Instructions (Signed)
Efinaconazole Topical Solution What is this medicine? EFINACONAZOLE (e FEE na KON a zole) is an antifungal medicine. It is used to treat certain kinds of fungal infections of the toenail. This medicine may be used for other purposes; ask your health care provider or pharmacist if you have questions. COMMON BRAND NAME(S): JUBLIA What should I tell my health care provider before I take this medicine? They need to know if you have any of these conditions:  an unusual or allergic reaction to efinaconazole, other medicines, foods, dyes or preservatives  pregnant or trying to get pregnant  breast-feeding How should I use this medicine? This medicine is for external use only. Do not take by mouth. Follow the directions on the label. Wash hands before and after use. Apply this medicine using the provided brush to cover the entire toenail. Do not use your medicine more often than directed. Finish the full course prescribed by your doctor or health care professional even if you think your condition is better. Do not stop using except on the advice of your doctor or health care professional. Talk to your pediatrician regarding the use of this medicine in children. While this drug may be prescribed for children as young as 6 years for selected conditions, precautions do apply. Overdosage: If you think you have taken too much of this medicine contact a poison control center or emergency room at once. NOTE: This medicine is only for you. Do not share this medicine with others. What if I miss a dose? If you miss a dose, use it as soon as you can. If it is almost time for your next dose, use only that dose. Do not use double or extra doses. What may interact with this medicine? Interactions have not been studied. Do not use any other nail products (i.e., nail polish, pedicures) during treatment with this medicine. This list may not describe all possible interactions. Give your health care provider a list of  all the medicines, herbs, non-prescription drugs, or dietary supplements you use. Also tell them if you smoke, drink alcohol, or use illegal drugs. Some items may interact with your medicine. What should I watch for while using this medicine? Do not get this medicine in your eyes. If you do, rinse out with plenty of cool tap water. Tell your doctor or health care professional if your symptoms do not start to get better or if they get worse. Wait for at least 10 minutes after bathing before applying this medication. After bathing, make sure that your feet are very dry. Fungal infections like moist conditions. Do not walk around barefoot. To help prevent reinfection, wear freshly washed cotton, not synthetic clothing. Tell your doctor or health care professional if you develop sores or blisters that do not heal properly. If your nail infection returns after you stop using this medicine, contact your doctor or health care professional. What side effects may I notice from receiving this medicine? Side effects that you should report to your doctor or health care professional as soon as possible:  allergic reactions like skin rash, itching or hives, swelling of the face, lips, or tongue  ingrown toenail Side effects that usually do not require medical attention (report to your doctor or health care professional if they continue or are bothersome):  mild skin irritation, burning, or itching This list may not describe all possible side effects. Call your doctor for medical advice about side effects. You may report side effects to FDA at 1-800-FDA-1088. Where should I  keep my medicine? Keep out of the reach of children. Store at room temperature between 20 and 25 degrees C (68 and 77 degrees F). Keep this medicine in the original container. Throw away any unused medicine after the expiration date. This medicine is flammable. Avoid exposure to heat, fire, flame, and smoking. NOTE: This sheet is a summary.  It may not cover all possible information. If you have questions about this medicine, talk to your doctor, pharmacist, or health care provider.  2021 Elsevier/Gold Standard (2018-12-12 16:14:11)    Plantar Fasciitis (Heel Spur Syndrome) with Rehab The plantar fascia is a fibrous, ligament-like, soft-tissue structure that spans the bottom of the foot. Plantar fasciitis is a condition that causes pain in the foot due to inflammation of the tissue. SYMPTOMS   Pain and tenderness on the underneath side of the foot.  Pain that worsens with standing or walking. CAUSES  Plantar fasciitis is caused by irritation and injury to the plantar fascia on the underneath side of the foot. Common mechanisms of injury include:  Direct trauma to bottom of the foot.  Damage to a small nerve that runs under the foot where the main fascia attaches to the heel bone.  Stress placed on the plantar fascia due to bone spurs. RISK INCREASES WITH:   Activities that place stress on the plantar fascia (running, jumping, pivoting, or cutting).  Poor strength and flexibility.  Improperly fitted shoes.  Tight calf muscles.  Flat feet.  Failure to warm-up properly before activity.  Obesity. PREVENTION  Warm up and stretch properly before activity.  Allow for adequate recovery between workouts.  Maintain physical fitness:  Strength, flexibility, and endurance.  Cardiovascular fitness.  Maintain a health body weight.  Avoid stress on the plantar fascia.  Wear properly fitted shoes, including arch supports for individuals who have flat feet.  PROGNOSIS  If treated properly, then the symptoms of plantar fasciitis usually resolve without surgery. However, occasionally surgery is necessary.  RELATED COMPLICATIONS   Recurrent symptoms that may result in a chronic condition.  Problems of the lower back that are caused by compensating for the injury, such as limping.  Pain or weakness of the foot  during push-off following surgery.  Chronic inflammation, scarring, and partial or complete fascia tear, occurring more often from repeated injections.  TREATMENT  Treatment initially involves the use of ice and medication to help reduce pain and inflammation. The use of strengthening and stretching exercises may help reduce pain with activity, especially stretches of the Achilles tendon. These exercises may be performed at home or with a therapist. Your caregiver may recommend that you use heel cups of arch supports to help reduce stress on the plantar fascia. Occasionally, corticosteroid injections are given to reduce inflammation. If symptoms persist for greater than 6 months despite non-surgical (conservative), then surgery may be recommended.   MEDICATION   If pain medication is necessary, then nonsteroidal anti-inflammatory medications, such as aspirin and ibuprofen, or other minor pain relievers, such as acetaminophen, are often recommended.  Do not take pain medication within 7 days before surgery.  Prescription pain relievers may be given if deemed necessary by your caregiver. Use only as directed and only as much as you need.  Corticosteroid injections may be given by your caregiver. These injections should be reserved for the most serious cases, because they may only be given a certain number of times.  HEAT AND COLD  Cold treatment (icing) relieves pain and reduces inflammation. Cold treatment should be  applied for 10 to 15 minutes every 2 to 3 hours for inflammation and pain and immediately after any activity that aggravates your symptoms. Use ice packs or massage the area with a piece of ice (ice massage).  Heat treatment may be used prior to performing the stretching and strengthening activities prescribed by your caregiver, physical therapist, or athletic trainer. Use a heat pack or soak the injury in warm water.  SEEK IMMEDIATE MEDICAL CARE IF:  Treatment seems to offer no  benefit, or the condition worsens.  Any medications produce adverse side effects.  EXERCISES- RANGE OF MOTION (ROM) AND STRETCHING EXERCISES - Plantar Fasciitis (Heel Spur Syndrome) These exercises may help you when beginning to rehabilitate your injury. Your symptoms may resolve with or without further involvement from your physician, physical therapist or athletic trainer. While completing these exercises, remember:   Restoring tissue flexibility helps normal motion to return to the joints. This allows healthier, less painful movement and activity.  An effective stretch should be held for at least 30 seconds.  A stretch should never be painful. You should only feel a gentle lengthening or release in the stretched tissue.  RANGE OF MOTION - Toe Extension, Flexion  Sit with your right / left leg crossed over your opposite knee.  Grasp your toes and gently pull them back toward the top of your foot. You should feel a stretch on the bottom of your toes and/or foot.  Hold this stretch for 10 seconds.  Now, gently pull your toes toward the bottom of your foot. You should feel a stretch on the top of your toes and or foot.  Hold this stretch for 10 seconds. Repeat  times. Complete this stretch 3 times per day.   RANGE OF MOTION - Ankle Dorsiflexion, Active Assisted  Remove shoes and sit on a chair that is preferably not on a carpeted surface.  Place right / left foot under knee. Extend your opposite leg for support.  Keeping your heel down, slide your right / left foot back toward the chair until you feel a stretch at your ankle or calf. If you do not feel a stretch, slide your bottom forward to the edge of the chair, while still keeping your heel down.  Hold this stretch for 10 seconds. Repeat 3 times. Complete this stretch 2 times per day.   STRETCH  Gastroc, Standing  Place hands on wall.  Extend right / left leg, keeping the front knee somewhat bent.  Slightly point your  toes inward on your back foot.  Keeping your right / left heel on the floor and your knee straight, shift your weight toward the wall, not allowing your back to arch.  You should feel a gentle stretch in the right / left calf. Hold this position for 10 seconds. Repeat 3 times. Complete this stretch 2 times per day.  STRETCH  Soleus, Standing  Place hands on wall.  Extend right / left leg, keeping the other knee somewhat bent.  Slightly point your toes inward on your back foot.  Keep your right / left heel on the floor, bend your back knee, and slightly shift your weight over the back leg so that you feel a gentle stretch deep in your back calf.  Hold this position for 10 seconds. Repeat 3 times. Complete this stretch 2 times per day.  STRETCH  Gastrocsoleus, Standing  Note: This exercise can place a lot of stress on your foot and ankle. Please complete this exercise only  if specifically instructed by your caregiver.   Place the ball of your right / left foot on a step, keeping your other foot firmly on the same step.  Hold on to the wall or a rail for balance.  Slowly lift your other foot, allowing your body weight to press your heel down over the edge of the step.  You should feel a stretch in your right / left calf.  Hold this position for 10 seconds.  Repeat this exercise with a slight bend in your right / left knee. Repeat 3 times. Complete this stretch 2 times per day.   STRENGTHENING EXERCISES - Plantar Fasciitis (Heel Spur Syndrome)  These exercises may help you when beginning to rehabilitate your injury. They may resolve your symptoms with or without further involvement from your physician, physical therapist or athletic trainer. While completing these exercises, remember:   Muscles can gain both the endurance and the strength needed for everyday activities through controlled exercises.  Complete these exercises as instructed by your physician, physical therapist or  athletic trainer. Progress the resistance and repetitions only as guided.  STRENGTH - Towel Curls  Sit in a chair positioned on a non-carpeted surface.  Place your foot on a towel, keeping your heel on the floor.  Pull the towel toward your heel by only curling your toes. Keep your heel on the floor. Repeat 3 times. Complete this exercise 2 times per day.  STRENGTH - Ankle Inversion  Secure one end of a rubber exercise band/tubing to a fixed object (table, pole). Loop the other end around your foot just before your toes.  Place your fists between your knees. This will focus your strengthening at your ankle.  Slowly, pull your big toe up and in, making sure the band/tubing is positioned to resist the entire motion.  Hold this position for 10 seconds.  Have your muscles resist the band/tubing as it slowly pulls your foot back to the starting position. Repeat 3 times. Complete this exercises 2 times per day.  Document Released: 08/03/2005 Document Revised: 10/26/2011 Document Reviewed: 11/15/2008 [facility] Patient Information 2014 [place], [place].

## 2020-12-06 NOTE — Progress Notes (Signed)
Subjective:   Patient ID: Tiffany Browning, female   DOB: 65 y.o.   MRN: [id number]   HPI 65 year old female presents the office for concerns of toenail fungus with the right side worse than left.  She has noticed the nails becoming somewhat thickened and discolored.  Denies any redness or drainage and swelling to the toenail sites.  She does get pedicures regularly.  No recent treatment.  Also states that she has occasional discomfort in the bottoms of her heels.  No recent injury or falls.  No swelling.  Numbness or tingling.  No radiating pain.  Seems to be worse after being on her feet all day at work.   Review of Systems  All other systems reviewed and are negative.  Past Medical History:  Diagnosis Date  . Allergy   . Asthma    under control  . Breast cancer ([place])   . Diabetes mellitus    Type 1-on insulin pump  . Family history of breast cancer   . Family history of kidney cancer   . Heart murmur   . Hypothyroidism   . Personal history of radiation therapy   . Thyroid disease    Hypothyroidism    Past Surgical History:  Procedure Laterality Date  . ABDOMINAL HYSTERECTOMY    . APPENDECTOMY    . BREAST BIOPSY Right 02/14/2016  . BREAST BIOPSY Right 01/09/2016  . BREAST LUMPECTOMY Right 03/26/2016  . BREAST LUMPECTOMY WITH AXILLARY LYMPH NODE BIOPSY Right 03/26/2016   Procedure: RIGHT BREAST LUMPECTOMY WITH AXILLARY LYMPH NODE BIOPSY;  Surgeon: Autumn Messing III, MD;  Location: [facility];  Service: General;  Laterality: Right;  . CARPAL TUNNEL RELEASE Right 02/09/2019   Procedure: RIGHT CARPAL TUNNEL RELEASE;  Surgeon: Daryll Brod, MD;  Location: [facility];  Service: Orthopedics;  Laterality: Right;  . NASAL SINUS SURGERY    . ruptured ovarian cyst    . SHOULDER ARTHROSCOPY  2004   b/L --second one 2011  . TONSILECTOMY, ADENOIDECTOMY, BILATERAL MYRINGOTOMY AND TUBES    . TRIGGER FINGER RELEASE Right 07/04/2020   Procedure: RELEASE TRIGGER FINGER/A-1 PULLEY;   Surgeon: Daryll Brod, MD;  Location: [facility];  Service: Orthopedics;  Laterality: Right;  IV REGIONAL FOREARM BLOCK  . TUBAL LIGATION     then reversed it     Current Outpatient Medications:  .  albuterol (PROAIR HFA) 108 (90 Base) MCG/ACT inhaler, , Disp: , Rfl:  .  Calcium Carb-Cholecalciferol (CALTRATE 600+D3) 600-800 MG-UNIT TABS, 1x a day, Disp: , Rfl:  .  gabapentin (NEURONTIN) 300 MG capsule, 1 po qhs, Disp: , Rfl:  .  Alpha-Lipoic Acid 300 MG CAPS, Take by mouth daily., Disp: , Rfl:  .  b complex vitamins tablet, Take 1 tablet by mouth daily., Disp: , Rfl:  .  Biotin 10000 MCG TABS, Take 1 tablet by mouth daily., Disp: , Rfl:  .  Blood Glucose Monitoring Suppl (FREESTYLE LITE) w/Device KIT, USE AS DIRECTED, Disp: 1 kit, Rfl: 3 .  Calcium-Magnesium-Vitamin D (CALCIUM MAGNESIUM PO), Take 1 tablet by mouth daily., Disp: , Rfl:  .  cephALEXin (KEFLEX) 500 MG capsule, cephalexin 500 mg capsule, Disp: , Rfl:  .  Cinnamon 500 MG capsule, Take 500 mg by mouth daily., Disp: , Rfl:  .  COVID-19 mRNA vaccine, Pfizer, 30 MCG/0.3ML injection, INJECT AS DIRECTED, Disp: .3 mL, Rfl: 0 .  Efinaconazole 10 % SOLN, Apply 1 drop topically daily., Disp: 4 mL, Rfl: 11 .  fish  oil-omega-3 fatty acids 1000 MG capsule, Take 1 g by mouth daily., Disp: , Rfl:  .  glucose blood test strip, USE TO CHECK BLOOD SUGAR 6 TIMES A DAY, Disp: 550 strip, Rfl: 3 .  glucose blood test strip, USE AS DIRECTED UP TO 8 TIMES DAILY, Disp: 200 strip, Rfl: 3 .  HYDROcodone-acetaminophen (NORCO/VICODIN) 5-325 MG tablet, hydrocodone 5 mg-acetaminophen 325 mg tablet, Disp: , Rfl:  .  influenza vac split quadrivalent PF (AFLURIA QUADRIVALENT) 0.5 ML injection, Afluria Qd 2019-20 (36 mos up)(PF)60 mcg (15 mcg x4)/0.5 mL IM syringe  TO BE ADMINISTERED BY PHARMACIST FOR IMMUNIZATION, Disp: , Rfl:  .  insulin aspart (NOVOLOG) 100 UNIT/ML injection, Per Dr South (Patient taking differently: Inject 0.3-0.4 Units into the  skin continuous. Insulin pump-Per Dr South), Disp: 3 vial, Rfl: PRN .  Insulin Lispro-aabc 100 UNIT/ML SOLN, USE AS DIRECTED PER INSULIN PUMP UP TO 35 UNITS/DAY., Disp: 40 mL, Rfl: 3 .  levothyroxine (SYNTHROID) 175 MCG tablet, TAKE 1 TABLET BY MOUTH ONCE DAILY MONDAY THRU SATURDAY. NONE ON SUNDAY, Disp: 78 tablet, Rfl: 0 .  levothyroxine (SYNTHROID, LEVOTHROID) 175 MCG tablet, Take 175 mcg by mouth daily. Take all days except on Sunday, Disp: , Rfl:  .  montelukast (SINGULAIR) 10 MG tablet, Take 10 mg by mouth at bedtime., Disp: , Rfl:  .  Multiple Vitamin (MULTIVITAMIN) tablet, Take 1 tablet by mouth daily., Disp: , Rfl:  .  naproxen (NAPROSYN) 375 MG tablet, Take 1 tablet (375 mg total) by mouth 3 (three) times daily with meals., Disp: 30 tablet, Rfl: 0 .  neomycin-polymyxin-hydrocortisone (CORTISPORIN) 3.5-10000-1 OTIC suspension, 3 drops 3 (three) times daily., Disp: , Rfl:  .  rosuvastatin (CRESTOR) 5 MG tablet, Take 5 mg by mouth daily., Disp: , Rfl: 3 .  rosuvastatin (CRESTOR) 5 MG tablet, TAKE 1 TABLET BY MOUTH ONCE DAILY, Disp: 90 tablet, Rfl: 0 .  tamoxifen (NOLVADEX) 20 MG tablet, Take 1 tablet (20 mg total) by mouth daily., Disp: 30 tablet, Rfl: 1 .  tamoxifen (NOLVADEX) 20 MG tablet, TAKE 1 TABLET BY MOUTH ONCE DAILY, Disp: 90 tablet, Rfl: 4 .  traMADol (ULTRAM) 50 MG tablet, TAKE 1 TABLET BY MOUTH EVERY 6 HOURS AS NEEDED., Disp: 20 tablet, Rfl: 0 .  venlafaxine XR (EFFEXOR-XR) 75 MG 24 hr capsule, TAKE 1 CAPSULE BY MOUTH DAILY WITH BREAKFAST, Disp: 90 capsule, Rfl: 1  No Known Allergies       Objective:  Physical Exam  General: AAO x3, NAD  Dermatological: Nails appear to be mildly hypertrophic, dystrophic with yellow-brown discoloration most on the hallux toenails there is no edema, erythema, drainage or pus or any signs of infection noted today.  No open lesions.  Vascular: Dorsalis Pedis artery and Posterior Tibial artery pedal pulses are 2/4 bilateral with immedate  capillary fill time. There is no pain with calf compression, swelling, warmth, erythema.   Neruologic: Grossly intact via light touch bilateral.  Negative Tinel sign.  Musculoskeletal: There is mild tenderness palpation on plantar medial tubercle of the calcaneus at the insertion plantar fascia.  No pain with lateral compression of calcaneus.  There is no pain on the Achilles tendon.  No edema, erythema.  Negative Tinel sign.  Muscular strength 5/5 in all groups tested bilateral.  Gait: Unassisted, Nonantalgic.       Assessment:   64-year-old female with onychomycosis, plantar fasciitis     Plan:  -Treatment options discussed including all alternatives, risks, and complications -Etiology of symptoms were discussed    1.  Onychomycosis -Discussed oral, topical treatment as well as alternative treatments.  After discussion was proceed with topical.  Prescribed Jublia.   2.  Plantar fasciitis-secondary concern -We discussed stretching, icing daily.  Discussed modifications good arch supports.  If symptoms continue we can do steroid injection.  Also if symptoms continue recommend x-rays.  Trula Slade DPM

## 2020-12-06 NOTE — Telephone Encounter (Signed)
Please advise 

## 2020-12-09 NOTE — Telephone Encounter (Signed)
Called patient on Friday 12-06-2020 and left a message. Tiffany Browning

## 2020-12-27 ENCOUNTER — Other Ambulatory Visit (HOSPITAL_COMMUNITY): Payer: Self-pay

## 2020-12-27 ENCOUNTER — Other Ambulatory Visit: Payer: Self-pay | Admitting: Endocrinology

## 2020-12-28 ENCOUNTER — Other Ambulatory Visit (HOSPITAL_COMMUNITY): Payer: Self-pay

## 2020-12-28 MED ORDER — ROSUVASTATIN CALCIUM 5 MG PO TABS
5.0000 mg | ORAL_TABLET | Freq: Every day | ORAL | 0 refills | Status: DC
  Filled 2020-12-28: qty 90, 90d supply, fill #0

## 2021-02-03 ENCOUNTER — Other Ambulatory Visit (HOSPITAL_COMMUNITY): Payer: Self-pay

## 2021-02-03 MED FILL — Glucose Blood Test Strip: 84 days supply | Qty: 500 | Fill #0 | Status: AC

## 2021-02-03 MED FILL — Insulin Lispro-aabc Inj 100 Unit/ML: INTRAMUSCULAR | 90 days supply | Qty: 40 | Fill #0 | Status: AC

## 2021-02-04 ENCOUNTER — Other Ambulatory Visit (HOSPITAL_COMMUNITY): Payer: Self-pay

## 2021-02-06 DIAGNOSIS — B351 Tinea unguium: Secondary | ICD-10-CM | POA: Diagnosis not present

## 2021-02-06 DIAGNOSIS — E039 Hypothyroidism, unspecified: Secondary | ICD-10-CM | POA: Diagnosis not present

## 2021-02-06 DIAGNOSIS — J45909 Unspecified asthma, uncomplicated: Secondary | ICD-10-CM | POA: Diagnosis not present

## 2021-02-06 DIAGNOSIS — Z4681 Encounter for fitting and adjustment of insulin pump: Secondary | ICD-10-CM | POA: Diagnosis not present

## 2021-02-06 DIAGNOSIS — Z1389 Encounter for screening for other disorder: Secondary | ICD-10-CM | POA: Diagnosis not present

## 2021-02-06 DIAGNOSIS — Z794 Long term (current) use of insulin: Secondary | ICD-10-CM | POA: Diagnosis not present

## 2021-02-06 DIAGNOSIS — Z1331 Encounter for screening for depression: Secondary | ICD-10-CM | POA: Diagnosis not present

## 2021-02-06 DIAGNOSIS — E1042 Type 1 diabetes mellitus with diabetic polyneuropathy: Secondary | ICD-10-CM | POA: Diagnosis not present

## 2021-02-06 DIAGNOSIS — C50411 Malignant neoplasm of upper-outer quadrant of right female breast: Secondary | ICD-10-CM | POA: Diagnosis not present

## 2021-02-20 ENCOUNTER — Other Ambulatory Visit (HOSPITAL_COMMUNITY): Payer: Self-pay

## 2021-02-20 ENCOUNTER — Other Ambulatory Visit: Payer: Self-pay

## 2021-02-24 ENCOUNTER — Other Ambulatory Visit (HOSPITAL_COMMUNITY): Payer: Self-pay

## 2021-02-25 ENCOUNTER — Other Ambulatory Visit (HOSPITAL_COMMUNITY): Payer: Self-pay

## 2021-02-26 ENCOUNTER — Other Ambulatory Visit: Payer: Self-pay

## 2021-02-26 ENCOUNTER — Other Ambulatory Visit (HOSPITAL_COMMUNITY): Payer: Self-pay

## 2021-02-27 ENCOUNTER — Other Ambulatory Visit (HOSPITAL_COMMUNITY): Payer: Self-pay

## 2021-02-27 ENCOUNTER — Other Ambulatory Visit: Payer: Self-pay

## 2021-02-27 DIAGNOSIS — M8580 Other specified disorders of bone density and structure, unspecified site: Secondary | ICD-10-CM | POA: Diagnosis not present

## 2021-02-27 DIAGNOSIS — R5383 Other fatigue: Secondary | ICD-10-CM | POA: Diagnosis not present

## 2021-02-27 DIAGNOSIS — Z01419 Encounter for gynecological examination (general) (routine) without abnormal findings: Secondary | ICD-10-CM | POA: Diagnosis not present

## 2021-02-27 DIAGNOSIS — N941 Unspecified dyspareunia: Secondary | ICD-10-CM | POA: Diagnosis not present

## 2021-02-27 DIAGNOSIS — U099 Post covid-19 condition, unspecified: Secondary | ICD-10-CM | POA: Diagnosis not present

## 2021-02-27 MED ORDER — LEVOTHYROXINE SODIUM 175 MCG PO TABS
ORAL_TABLET | ORAL | 0 refills | Status: DC
  Filled 2021-02-27: qty 72, 90d supply, fill #0

## 2021-02-28 ENCOUNTER — Other Ambulatory Visit (HOSPITAL_COMMUNITY): Payer: Self-pay

## 2021-02-28 ENCOUNTER — Other Ambulatory Visit: Payer: Self-pay | Admitting: Oncology

## 2021-02-28 MED ORDER — VENLAFAXINE HCL ER 75 MG PO CP24
ORAL_CAPSULE | Freq: Every day | ORAL | 1 refills | Status: DC
  Filled 2021-02-28: qty 90, 90d supply, fill #0

## 2021-02-28 MED FILL — Tamoxifen Citrate Tab 20 MG (Base Equivalent): ORAL | 90 days supply | Qty: 90 | Fill #1 | Status: AC

## 2021-03-14 ENCOUNTER — Other Ambulatory Visit: Payer: Self-pay | Admitting: Nurse Practitioner

## 2021-03-14 DIAGNOSIS — M8580 Other specified disorders of bone density and structure, unspecified site: Secondary | ICD-10-CM

## 2021-03-18 ENCOUNTER — Other Ambulatory Visit: Payer: Self-pay | Admitting: Oncology

## 2021-03-18 DIAGNOSIS — Z1231 Encounter for screening mammogram for malignant neoplasm of breast: Secondary | ICD-10-CM

## 2021-03-26 DIAGNOSIS — E109 Type 1 diabetes mellitus without complications: Secondary | ICD-10-CM | POA: Diagnosis not present

## 2021-03-26 DIAGNOSIS — E1065 Type 1 diabetes mellitus with hyperglycemia: Secondary | ICD-10-CM | POA: Diagnosis not present

## 2021-03-29 ENCOUNTER — Other Ambulatory Visit: Payer: Self-pay

## 2021-03-30 NOTE — Progress Notes (Signed)
[place]  [phone] [phone]     ID: Tiffany Browning DOB: 04-12-1956  MR#: [id number]  JSE#:[id number]  Patient Care Team: Carollee Herter, Alferd Apa, DO as PCP - General (Family Medicine) Reynold Bowen, MD as Consulting Physician (Endocrinology) [name], MD as Consulting Physician (General Surgery) Zadaya Cuadra, [name], MD as Consulting Physician (Oncology) [name], MD as Attending Physician (Radiation Oncology) [name], NP as Nurse Practitioner (Hematology and Oncology) OTHER MD:   CHIEF COMPLAINT: Estrogen receptor positive breast cancer  CURRENT TREATMENT:  tamoxifen   INTERVAL HISTORY: Tiffany Browning was scheduled today for follow-up and treatment of her estrogen receptor positive breast cancer. However she did not show    Since her last visit, she underwent bilateral diagnostic mammography with tomography at [facility] on 04/18/2020 showing: breast density category C; no evidence of malignancy in either breast.   She is scheduled for her next mammogram on 05/13/2021 and for bone density screening on 09/05/2021.   REVIEW OF SYSTEMS: [facility]    COVID 19 VACCINATION STATUS: Pfizer   BREAST CANCER HISTORY: From the original intake note:  Tiffany Browning had routine physical exam by her gynecologist in May 2017 and she was able to palpate a mass in the upper-outer quadrant of her right breast. The patient had had bilateral screening mammography 06/26/2015 at the [facility], with no suspicious findings. On 12/31/2015 she underwent bilateral diagnostic mammography with tomography and right breast ultrasonography. The breast density was category D. On focal spot compression in the area of palpable concern there was a possible mass, hard to make out because of the breast density. On exam there was a firm palpable lump measuring approximately 3 cm at the 9:00 region of the right breast, 4 cm from the nipple. The overlying  skin was unremarkable. Ultrasonography confirmed an irregular hypoechoic mass in the area in question measuring 3.0 cm.  Also, separate from the palpable mass at the 9:00 position but 2 cm from the nipple was a hypoechoic oval mass measuring 2.0 cm. Ultrasound of the right axilla was negative.  Biopsy of both the masses in question from the right rest were obtained 01/09/2016. This showed (SAA 17-90-2) the mass closer to the nipple, S2 centimeters, to be a fibroadenoma. The more distant mass, 4 cm from the nipple, was invasive ductal carcinoma, E-cadherin positive, grade 2, estrogen receptor 100% positive, progesterone receptor 100% positive, with an MIB-1 of 30%, and no HER-2 amplification, the signals ratio being 1.43 and the number per cell 2.15.  The patient's subsequent history is as detailed above.     [name] PAST MEDICAL HISTORY: Past Medical History:  Diagnosis Date   Allergy    Asthma    under control   Breast cancer ([place])    Diabetes mellitus    Type 1-on insulin pump   Family history of breast cancer    Family history of kidney cancer    Heart murmur    Hypothyroidism    Personal history of radiation therapy    Thyroid disease    Hypothyroidism    PAST SURGICAL HISTORY: Past Surgical History:  Procedure Laterality Date   ABDOMINAL HYSTERECTOMY     APPENDECTOMY     BREAST BIOPSY Right 02/14/2016   BREAST BIOPSY Right 01/09/2016   BREAST LUMPECTOMY Right 03/26/2016   BREAST LUMPECTOMY WITH AXILLARY LYMPH NODE BIOPSY Right 03/26/2016   Procedure: RIGHT BREAST LUMPECTOMY WITH AXILLARY LYMPH NODE BIOPSY;  Surgeon: Autumn Messing III, MD;  Location:  [facility];  Service: General;  Laterality: Right;   CARPAL TUNNEL RELEASE Right 02/09/2019   Procedure: RIGHT CARPAL TUNNEL RELEASE;  Surgeon: Daryll Brod, MD;  Location: [facility];  Service: Orthopedics;  Laterality: Right;   NASAL SINUS SURGERY     ruptured ovarian cyst     SHOULDER ARTHROSCOPY  2004   b/L --second one  2011   TONSILECTOMY, ADENOIDECTOMY, BILATERAL MYRINGOTOMY AND TUBES     TRIGGER FINGER RELEASE Right 07/04/2020   Procedure: RELEASE TRIGGER FINGER/A-1 PULLEY;  Surgeon: Daryll Brod, MD;  Location: [facility];  Service: Orthopedics;  Laterality: Right;  IV REGIONAL FOREARM BLOCK   TUBAL LIGATION     then reversed it    FAMILY HISTORY Family History  Problem Relation Age of Onset   Cancer Mother        cervical   Hypertension Mother    Hyperlipidemia Mother    Heart disease Father 1       MI---- cabg before that   Hypothyroidism Sister    Breast cancer Sister 80   Heart disease Brother 73       MI--- second one 30   Hyperlipidemia Brother    Hypertension Brother    Hypothyroidism Sister    Kidney cancer Other 68       son of her sister with breast cancer  The patient's father died from heart disease in his 66s in the setting of tobacco abuse. The patient's mother died from pneumonia at the age of 66 also in the setting of tobacco abuse. The patient had one brother, 2 sisters. One of the sisters, Basilio Cairo, was diagnosed with breast cancer at the age of 83. The patient's mother may have had a history of cervical cancer. There is no history of ovarian cancer in the family.    GYNECOLOGIC HISTORY:  No LMP recorded. Patient has had a hysterectomy.  menarche 40. The patient is GX P0. Tiffany Browning underwent total abdominal hysterectomy with bilateral salpingo-oophorectomy in 1990 for what was thought at that time might be ovarian cancer but proved to be a benign cyst. 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 She is taking courses in advanced codeine at [employer] in is going to be "graduating" late July. Her husband Tiffany Browning (MURLINE WEIGEL) works for [employer]. It's just the 2 of them plus their dog Tiffany Browning at home.     ADVANCED DIRECTIVES:  in place: Both  have a living well.    HEALTH MAINTENANCE: Social History   Tobacco Use   Smoking status: Never   Smokeless tobacco: Never  Vaping Use   Vaping Use: Never used  Substance Use Topics   Alcohol use: Yes    Comment: occ   Drug use: No     Colonoscopy:2007/ [facility]  PAP: s/p hysterectomy  Bone density:  Lipid panel:  No Known Allergies   Current Outpatient Medications  Medication Sig Dispense Refill   albuterol (PROAIR HFA) 108 (90 Base) MCG/ACT inhaler      Alpha-Lipoic Acid 300 MG CAPS Take by mouth daily.     b complex vitamins tablet Take 1 tablet by mouth daily.     Biotin 10000 MCG TABS Take 1 tablet by mouth daily.     Blood Glucose Monitoring Suppl (FREESTYLE LITE) w/Device KIT USE AS DIRECTED 1 kit 3   Calcium Carb-Cholecalciferol (CALTRATE 600+D3) 600-800 MG-UNIT  TABS 1x a day     Calcium-Magnesium-Vitamin D (CALCIUM MAGNESIUM PO) Take 1 tablet by mouth daily.     cephALEXin (KEFLEX) 500 MG capsule cephalexin 500 mg capsule     Cinnamon 500 MG capsule Take 500 mg by mouth daily.     COVID-19 mRNA vaccine, Pfizer, 30 MCG/0.3ML injection INJECT AS DIRECTED .3 mL 0   Efinaconazole 10 % SOLN Apply 1 drop topically daily. 4 mL 11   fish oil-omega-3 fatty acids 1000 MG capsule Take 1 g by mouth daily.     gabapentin (NEURONTIN) 300 MG capsule 1 po qhs     glucose blood test strip USE TO CHECK BLOOD SUGAR 6 TIMES A DAY 550 strip 3   glucose blood test strip USE AS DIRECTED UP TO 8 TIMES DAILY 200 strip 3   HYDROcodone-acetaminophen (NORCO/VICODIN) 5-325 MG tablet hydrocodone 5 mg-acetaminophen 325 mg tablet     influenza vac split quadrivalent PF (AFLURIA QUADRIVALENT) 0.5 ML injection Afluria Qd 2019-20 (36 mos up)(PF)60 mcg (15 mcg x4)/0.5 mL IM syringe  TO BE ADMINISTERED BY PHARMACIST FOR IMMUNIZATION     insulin aspart (NOVOLOG) 100 UNIT/ML injection Per Dr Forde Dandy (Patient taking differently: Inject 0.3-0.4 Units into the skin continuous. Insulin pump-Per Dr Forde Dandy) 3  vial PRN   Insulin Lispro-aabc 100 UNIT/ML SOLN USE AS DIRECTED PER INSULIN PUMP UP TO 35 UNITS/DAY. 40 mL 3   levothyroxine (SYNTHROID) 175 MCG tablet TAKE 1 TABLET BY MOUTH ONCE DAILY MONDAY THRU SATURDAY. NONE ON SUNDAY 78 tablet 0   levothyroxine (SYNTHROID) 175 MCG tablet Take 1 tablet by mouth Monday through Saturday. None on Sunday 90 tablet 0   levothyroxine (SYNTHROID, LEVOTHROID) 175 MCG tablet Take 175 mcg by mouth daily. Take all days except on Sunday     montelukast (SINGULAIR) 10 MG tablet Take 10 mg by mouth at bedtime.     Multiple Vitamin (MULTIVITAMIN) tablet Take 1 tablet by mouth daily.     naproxen (NAPROSYN) 375 MG tablet Take 1 tablet (375 mg total) by mouth 3 (three) times daily with meals. 30 tablet 0   neomycin-polymyxin-hydrocortisone (CORTISPORIN) 3.5-10000-1 OTIC suspension 3 drops 3 (three) times daily.     rosuvastatin (CRESTOR) 5 MG tablet Take 5 mg by mouth daily.  3   rosuvastatin (CRESTOR) 5 MG tablet Take 1 tablet (5 mg total) by mouth daily. 90 tablet 0   tamoxifen (NOLVADEX) 20 MG tablet Take 1 tablet (20 mg total) by mouth daily. 30 tablet 1   tamoxifen (NOLVADEX) 20 MG tablet TAKE 1 TABLET BY MOUTH ONCE DAILY 90 tablet 4   venlafaxine XR (EFFEXOR-XR) 75 MG 24 hr capsule TAKE 1 CAPSULE BY MOUTH DAILY WITH BREAKFAST 90 capsule 1   No current facility-administered medications for this visit.    OBJECTIVE:  There were no vitals filed for this visit.    There is no height or weight on file to calculate BMI.    ECOG FS:0 - Asymptomatic      LAB RESULTS:  CMP     Component Value Date/Time   NA 142 07/01/2020 1230   NA 140 12/15/2016 1234   K 4.5 07/01/2020 1230   K 4.8 12/15/2016 1234   CL 105 07/01/2020 1230   CO2 27 07/01/2020 1230   CO2 28 12/15/2016 1234   GLUCOSE 198 (H) 07/01/2020 1230   GLUCOSE 133 12/15/2016 1234   BUN 12 07/01/2020 1230   BUN 14.6 12/15/2016 1234   CREATININE 0.73 07/01/2020 1230  CREATININE 0.86 03/29/2020 0919    CREATININE 0.8 12/15/2016 1234   CALCIUM 8.8 (L) 07/01/2020 1230   CALCIUM 9.0 12/15/2016 1234   PROT 7.3 03/29/2020 0919   PROT 7.2 12/15/2016 1234   ALBUMIN 3.8 03/29/2020 0919   ALBUMIN 3.7 12/15/2016 1234   AST 33 03/29/2020 0919   AST 31 12/15/2016 1234   ALT 27 03/29/2020 0919   ALT 30 12/15/2016 1234   ALKPHOS 109 03/29/2020 0919   ALKPHOS 109 12/15/2016 1234   BILITOT 0.4 03/29/2020 0919   BILITOT <0.22 12/15/2016 1234   GFRNONAA >60 07/01/2020 1230   GFRNONAA >60 03/29/2020 0919   GFRAA >60 03/29/2020 0919    INo results found for: SPEP, UPEP  Lab Results  Component Value Date   WBC 3.8 (L) 03/29/2020   NEUTROABS 2.0 03/29/2020   HGB 11.5 (L) 03/29/2020   HCT 36.0 03/29/2020   MCV 95.5 03/29/2020   PLT 314 03/29/2020      Chemistry      Component Value Date/Time   NA 142 07/01/2020 1230   NA 140 12/15/2016 1234   K 4.5 07/01/2020 1230   K 4.8 12/15/2016 1234   CL 105 07/01/2020 1230   CO2 27 07/01/2020 1230   CO2 28 12/15/2016 1234   BUN 12 07/01/2020 1230   BUN 14.6 12/15/2016 1234   CREATININE 0.73 07/01/2020 1230   CREATININE 0.86 03/29/2020 0919   CREATININE 0.8 12/15/2016 1234      Component Value Date/Time   CALCIUM 8.8 (L) 07/01/2020 1230   CALCIUM 9.0 12/15/2016 1234   ALKPHOS 109 03/29/2020 0919   ALKPHOS 109 12/15/2016 1234   AST 33 03/29/2020 0919   AST 31 12/15/2016 1234   ALT 27 03/29/2020 0919   ALT 30 12/15/2016 1234   BILITOT 0.4 03/29/2020 0919   BILITOT <0.22 12/15/2016 1234       No results found for: LABCA2  No components found for: LABCA125  No results for input(s): INR in the last 168 hours.  Urinalysis    Component Value Date/Time   COLORURINE yellow 02/06/2010 1109   APPEARANCEUR Clear 02/06/2010 1109   LABSPEC 1.015 02/06/2010 1109   PHURINE 6.5 02/06/2010 1109   HGBUR negative 02/06/2010 1109   BILIRUBINUR negative 01/20/2018 1021   BILIRUBINUR negative 03/07/2016 1150   KETONESUR negative 01/20/2018  1021   PROTEINUR negative 01/20/2018 1021   PROTEINUR negative 03/07/2016 1150   UROBILINOGEN 0.2 01/20/2018 1021   UROBILINOGEN 0.2 02/06/2010 1109   NITRITE Negative 01/20/2018 1021   NITRITE negative 03/07/2016 1150   NITRITE negative 02/06/2010 1109   LEUKOCYTESUR Negative 01/20/2018 1021    STUDIES: No results found.   ELIGIBLE FOR AVAILABLE RESEARCH PROTOCOL: P[place]AS  ASSESSMENT: 65 y.o. [place], [place] woman status post right breast upper outer quadrant biopsy 01/09/2016 for a clinical T2 N0, stage IIA invasive ductal carcinoma, grade 2, estrogen and progesterone receptor positive, HER-2 nonamplified, with an MIB-1 of 30%.   (a) biopsy of a second upper outer quadrant area of concern 02/14/2016 was benign  (1) neoadjuvant tamoxifen started 01/28/2016  (2) right lumpectomy and sentinel lymph node sampling 03/26/2016 showed a pT2 pN0, stage IIA invasive ductal carcinoma, grade 2, with negative margins  (3) Oncotype DX score of 11 predicts a 10 year risk of recurrence outside the breast of 8% if the patient's only systemic therapy is tamoxifen for 5 years. It also predicted no benefit from chemotherapy  (4) adjuvant radiation 05/14/2016 to 06/10/2016  Site/dose:  1. The Right breast was treated to 40.05 Gy in 15 fractions at 2.67 Gy per fraction.  2. The Right breast was boosted to 10 Gy in 5 fractions at 2 Gy per fraction.  (5) tamoxifen continued through local treatment--never interrupted   (6) Genetics testing of the patient's nephew (kidney cancer at age 6) has been recommended   PLAN: Zerah did not show for her March 31, 2021 visit.  Since this was to have been her final visit here I did not send a reschedule letter instead I am placing a virtual visit with me for October.  She will have had her mammogram in September by then and I will advise her to discontinue tamoxifen at that time.  [name]. Tanmay Halteman, MD 03/30/21 4:37 PM Medical Oncology and Hematology [facility] [place], [place] [address] Tel. [phone]    Fax. [phone]   I, Wilburn Mylar, am acting as scribe for Dr. [name]. Nosson Wender.  I, Lurline Del MD, have reviewed the above documentation for accuracy and completeness, and I agree with the above.    *Total Encounter Time as defined by the Centers for Medicare and Medicaid Services includes, in addition to the face-to-face time of a patient visit (documented in the note above) non-face-to-face time: obtaining and reviewing outside history, ordering and reviewing medications, tests or procedures, care coordination (communications with other health care professionals or caregivers) and documentation in the medical record.

## 2021-03-31 ENCOUNTER — Inpatient Hospital Stay: Payer: 59 | Attending: Oncology | Admitting: Oncology

## 2021-03-31 ENCOUNTER — Other Ambulatory Visit (HOSPITAL_COMMUNITY): Payer: Self-pay

## 2021-03-31 ENCOUNTER — Inpatient Hospital Stay: Payer: 59

## 2021-03-31 MED ORDER — ROSUVASTATIN CALCIUM 5 MG PO TABS
5.0000 mg | ORAL_TABLET | Freq: Every day | ORAL | 3 refills | Status: DC
  Filled 2021-03-31: qty 90, 90d supply, fill #0
  Filled 2021-06-28: qty 90, 90d supply, fill #1
  Filled 2021-09-15: qty 90, 90d supply, fill #2
  Filled 2021-12-28: qty 90, 90d supply, fill #3

## 2021-04-03 ENCOUNTER — Ambulatory Visit: Payer: 59 | Admitting: Family Medicine

## 2021-04-04 ENCOUNTER — Other Ambulatory Visit: Payer: Self-pay

## 2021-04-04 ENCOUNTER — Encounter: Payer: Self-pay | Admitting: Family Medicine

## 2021-04-04 ENCOUNTER — Other Ambulatory Visit (HOSPITAL_COMMUNITY): Payer: Self-pay

## 2021-04-04 ENCOUNTER — Ambulatory Visit (INDEPENDENT_AMBULATORY_CARE_PROVIDER_SITE_OTHER): Payer: 59 | Admitting: Family Medicine

## 2021-04-04 VITALS — BP 98/60 | HR 66 | Temp 97.8°F | Resp 18 | Ht 61.0 in | Wt 127.6 lb

## 2021-04-04 DIAGNOSIS — E039 Hypothyroidism, unspecified: Secondary | ICD-10-CM | POA: Diagnosis not present

## 2021-04-04 DIAGNOSIS — E1069 Type 1 diabetes mellitus with other specified complication: Secondary | ICD-10-CM | POA: Diagnosis not present

## 2021-04-04 DIAGNOSIS — J4520 Mild intermittent asthma, uncomplicated: Secondary | ICD-10-CM

## 2021-04-04 DIAGNOSIS — R5383 Other fatigue: Secondary | ICD-10-CM | POA: Diagnosis not present

## 2021-04-04 DIAGNOSIS — L659 Nonscarring hair loss, unspecified: Secondary | ICD-10-CM | POA: Diagnosis not present

## 2021-04-04 LAB — CBC WITH DIFFERENTIAL/PLATELET
Basophils Absolute: 0 10*3/uL (ref 0.0–0.1)
Basophils Relative: 1 % (ref 0.0–3.0)
Eosinophils Absolute: 0.4 10*3/uL (ref 0.0–0.7)
Eosinophils Relative: 8.5 % — ABNORMAL HIGH (ref 0.0–5.0)
HCT: 33.3 % — ABNORMAL LOW (ref 36.0–46.0)
Hemoglobin: 11.1 g/dL — ABNORMAL LOW (ref 12.0–15.0)
Lymphocytes Relative: 27.4 % (ref 12.0–46.0)
Lymphs Abs: 1.2 10*3/uL (ref 0.7–4.0)
MCHC: 33.2 g/dL (ref 30.0–36.0)
MCV: 92.4 fl (ref 78.0–100.0)
Monocytes Absolute: 0.4 10*3/uL (ref 0.1–1.0)
Monocytes Relative: 8.6 % (ref 3.0–12.0)
Neutro Abs: 2.3 10*3/uL (ref 1.4–7.7)
Neutrophils Relative %: 54.5 % (ref 43.0–77.0)
Platelets: 309 10*3/uL (ref 150.0–400.0)
RBC: 3.6 Mil/uL — ABNORMAL LOW (ref 3.87–5.11)
RDW: 12.7 % (ref 11.5–15.5)
WBC: 4.2 10*3/uL (ref 4.0–10.5)

## 2021-04-04 LAB — COMPREHENSIVE METABOLIC PANEL
ALT: 30 U/L (ref 0–35)
AST: 37 U/L (ref 0–37)
Albumin: 4 g/dL (ref 3.5–5.2)
Alkaline Phosphatase: 92 U/L (ref 39–117)
BUN: 13 mg/dL (ref 6–23)
CO2: 29 mEq/L (ref 19–32)
Calcium: 8.9 mg/dL (ref 8.4–10.5)
Chloride: 104 mEq/L (ref 96–112)
Creatinine, Ser: 0.84 mg/dL (ref 0.40–1.20)
GFR: 73.73 mL/min (ref >60.00)
Glucose, Bld: 90 mg/dL (ref 70–99)
Potassium: 4.3 mEq/L (ref 3.5–5.1)
Sodium: 141 mEq/L (ref 135–145)
Total Bilirubin: 0.3 mg/dL (ref 0.2–1.2)
Total Protein: 6.7 g/dL (ref 6.0–8.3)

## 2021-04-04 LAB — VITAMIN B12: Vitamin B-12: 1105 pg/mL — ABNORMAL HIGH (ref 211–911)

## 2021-04-04 LAB — THYROID PANEL WITH TSH
Free Thyroxine Index: 2.5 (ref 1.4–3.8)
T3 Uptake: 26 % (ref 22–35)
T4, Total: 9.6 ug/dL (ref 5.1–11.9)
TSH: 0.03 mIU/L — ABNORMAL LOW (ref 0.40–4.50)

## 2021-04-04 MED ORDER — MONTELUKAST SODIUM 10 MG PO TABS
10.0000 mg | ORAL_TABLET | Freq: Every day | ORAL | 3 refills | Status: DC
  Filled 2021-04-04: qty 90, 90d supply, fill #0
  Filled 2021-08-03: qty 90, 90d supply, fill #1

## 2021-04-04 NOTE — Assessment & Plan Note (Signed)
Check labs 

## 2021-04-04 NOTE — Assessment & Plan Note (Signed)
Check labs Hypothyroid can cause hair loss and fatigue

## 2021-04-04 NOTE — Progress Notes (Addendum)
Subjective:   By signing my name below, I, Zite Okoli, attest that this documentation has been prepared under the direction and in the presence of [name], DO. 04/04/2021   Patient ID: Tiffany Browning, female    DOB: 07-16-1956, 65 y.o.   MRN: [id number]  Chief Complaint  Patient presents with   Fatigue   Hair Loss    Pt states noticing hair loss after having COVID in November 2021 and states fatigue started around this time. Pt states fatigue has worsen this summer.     HPI Patient is in today for an office visit.  She has Covid-19 in November and used mucinex and tylenol to relieve the symptoms such as ear pain and sore throat.  Since the, she has noticed significant hair loss and severe tiredness. She mentions that she used to be able to mow the lawn but now has to stop several times. Her personal trainer has also noticed a decrease in her stamina.  She checks her sugar levels at home and they have been good, ranging from the high 6's to the low 7's.  She has been managing her asthma with 10 mg Singulair PO and is doing well on it. She is requesting a refill on it.  Past Medical History:  Diagnosis Date   Allergy    Asthma    under control   Breast cancer ([place])    Diabetes mellitus    Type 1-on insulin pump   Family history of breast cancer    Family history of kidney cancer    Heart murmur    Hypothyroidism    Personal history of radiation therapy    Thyroid disease    Hypothyroidism    Past Surgical History:  Procedure Laterality Date   ABDOMINAL HYSTERECTOMY     APPENDECTOMY     BREAST BIOPSY Right 02/14/2016   BREAST BIOPSY Right 01/09/2016   BREAST LUMPECTOMY Right 03/26/2016   BREAST LUMPECTOMY WITH AXILLARY LYMPH NODE BIOPSY Right 03/26/2016   Procedure: RIGHT BREAST LUMPECTOMY WITH AXILLARY LYMPH NODE BIOPSY;  Surgeon: Autumn Messing III, MD;  Location: [facility];  Service: General;  Laterality: Right;   CARPAL TUNNEL RELEASE Right 02/09/2019    Procedure: RIGHT CARPAL TUNNEL RELEASE;  Surgeon: Daryll Brod, MD;  Location: [facility];  Service: Orthopedics;  Laterality: Right;   NASAL SINUS SURGERY     ruptured ovarian cyst     SHOULDER ARTHROSCOPY  2004   b/L --second one 2011   TONSILECTOMY, ADENOIDECTOMY, BILATERAL MYRINGOTOMY AND TUBES     TRIGGER FINGER RELEASE Right 07/04/2020   Procedure: RELEASE TRIGGER FINGER/A-1 PULLEY;  Surgeon: Daryll Brod, MD;  Location: [facility];  Service: Orthopedics;  Laterality: Right;  IV REGIONAL FOREARM BLOCK   TUBAL LIGATION     then reversed it    Family History  Problem Relation Age of Onset   Cancer Mother        cervical   Hypertension Mother    Hyperlipidemia Mother    Heart disease Father 69       MI---- cabg before that   Hypothyroidism Sister    Breast cancer Sister 74   Heart disease Brother 28       MI--- second one 71   Hyperlipidemia Brother    Hypertension Brother    Hypothyroidism Sister    Kidney cancer Other 46       son of her sister with breast cancer  Social History   Socioeconomic History   Marital status: Married    Spouse name: Tommy   Number of children: 0   Years of education: Not on file   Highest education level: Not on file  Occupational History   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  Tobacco Use   Smoking status: Never   Smokeless tobacco: Never  Vaping Use   Vaping Use: Never used  Substance and Sexual Activity   Alcohol use: Yes    Comment: occ   Drug use: No   Sexual activity: Yes    Partners: Male  Other Topics Concern   Not on file  Social History Narrative   Not on file   Social Determinants of Health   Financial Resource Strain: Not on file  Food Insecurity: Not on file  Transportation Needs: Not on file  Physical Activity: Not on file  Stress: Not on file  Social Connections: Not on file  Intimate Partner Violence: Not on file    Outpatient Medications Prior to Visit   Medication Sig Dispense Refill   albuterol (VENTOLIN HFA) 108 (90 Base) MCG/ACT inhaler      Alpha-Lipoic Acid 300 MG CAPS Take by mouth daily.     b complex vitamins tablet Take 1 tablet by mouth daily.     Biotin 10000 MCG TABS Take 1 tablet by mouth daily.     Blood Glucose Monitoring Suppl (FREESTYLE LITE) w/Device KIT USE AS DIRECTED 1 kit 3   Calcium Carb-Cholecalciferol (CALTRATE 600+D3) 600-800 MG-UNIT TABS 1x a day     Calcium-Magnesium-Vitamin D (CALCIUM MAGNESIUM PO) Take 1 tablet by mouth daily.     Cinnamon 500 MG capsule Take 500 mg by mouth daily.     Efinaconazole 10 % SOLN Apply 1 drop topically daily. 4 mL 11   fish oil-omega-3 fatty acids 1000 MG capsule Take 1 g by mouth daily.     glucose blood test strip USE TO CHECK BLOOD SUGAR 6 TIMES A DAY 550 strip 3   glucose blood test strip USE AS DIRECTED UP TO 8 TIMES DAILY 200 strip 3   influenza vac split quadrivalent PF (AFLURIA QUADRIVALENT) 0.5 ML injection Afluria Qd 2019-20 (36 mos up)(PF)60 mcg (15 mcg x4)/0.5 mL IM syringe  TO BE ADMINISTERED BY PHARMACIST FOR IMMUNIZATION     insulin aspart (NOVOLOG) 100 UNIT/ML injection Per Dr Forde Dandy (Patient taking differently: Inject 0.3-0.4 Units into the skin continuous. Insulin pump-Per Dr Forde Dandy) 3 vial PRN   Insulin Lispro-aabc 100 UNIT/ML SOLN USE AS DIRECTED PER INSULIN PUMP UP TO 35 UNITS/DAY. 40 mL 3   levothyroxine (SYNTHROID) 175 MCG tablet Take 1 tablet by mouth Monday through Saturday. None on Sunday 90 tablet 0   levothyroxine (SYNTHROID, LEVOTHROID) 175 MCG tablet Take 175 mcg by mouth daily. Take all days except on Sunday     Multiple Vitamin (MULTIVITAMIN) tablet Take 1 tablet by mouth daily.     neomycin-polymyxin-hydrocortisone (CORTISPORIN) 3.5-10000-1 OTIC suspension 3 drops 3 (three) times daily.     rosuvastatin (CRESTOR) 5 MG tablet Take 1 tablet (5 mg total) by mouth daily. 90 tablet 3   tamoxifen (NOLVADEX) 20 MG tablet Take 1 tablet (20 mg total) by mouth  daily. 30 tablet 1   tamoxifen (NOLVADEX) 20 MG tablet TAKE 1 TABLET BY MOUTH ONCE DAILY 90 tablet 4   venlafaxine XR (EFFEXOR-XR) 75 MG 24 hr capsule TAKE 1 CAPSULE BY MOUTH DAILY WITH BREAKFAST 90 capsule 1   montelukast (SINGULAIR) 10 MG  tablet Take 10 mg by mouth at bedtime.     levothyroxine (SYNTHROID) 175 MCG tablet TAKE 1 TABLET BY MOUTH ONCE DAILY MONDAY THRU SATURDAY. NONE ON SUNDAY 78 tablet 0   cephALEXin (KEFLEX) 500 MG capsule cephalexin 500 mg capsule (Patient not taking: Reported on 04/04/2021)     COVID-19 mRNA vaccine, Pfizer, 30 MCG/0.3ML injection INJECT AS DIRECTED (Patient not taking: Reported on 04/04/2021) .3 mL 0   gabapentin (NEURONTIN) 300 MG capsule 1 po qhs (Patient not taking: Reported on 04/04/2021)     HYDROcodone-acetaminophen (NORCO/VICODIN) 5-325 MG tablet hydrocodone 5 mg-acetaminophen 325 mg tablet (Patient not taking: Reported on 04/04/2021)     naproxen (NAPROSYN) 375 MG tablet Take 1 tablet (375 mg total) by mouth 3 (three) times daily with meals. (Patient not taking: Reported on 04/04/2021) 30 tablet 0   rosuvastatin (CRESTOR) 5 MG tablet Take 5 mg by mouth daily. (Patient not taking: Reported on 04/04/2021)  3   No facility-administered medications prior to visit.    No Known Allergies  Review of Systems  Constitutional:  Positive for malaise/fatigue. Negative for chills and fever.       (+) Hair loss  HENT:  Negative for congestion and hearing loss.   Eyes:  Negative for discharge.  Respiratory:  Negative for cough, sputum production and shortness of breath.   Cardiovascular:  Negative for chest pain, palpitations and leg swelling.  Gastrointestinal:  Negative for abdominal pain, blood in stool, constipation, diarrhea, heartburn, nausea and vomiting.  Genitourinary:  Negative for dysuria, frequency, hematuria and urgency.  Musculoskeletal:  Negative for back pain, falls and myalgias.  Skin:  Negative for rash.  Neurological:  Negative for dizziness,  sensory change, loss of consciousness, weakness and headaches.  Endo/Heme/Allergies:  Negative for environmental allergies. Does not bruise/bleed easily.  Psychiatric/Behavioral:  Negative for depression and suicidal ideas. The patient is not nervous/anxious and does not have insomnia.       Objective:    Physical Exam Constitutional:      General: She is not in acute distress.    Appearance: Normal appearance. She is not ill-appearing.  HENT:     Head: Normocephalic and atraumatic.     Right Ear: External ear normal.     Left Ear: External ear normal.  Eyes:     Extraocular Movements: Extraocular movements intact.     Pupils: Pupils are equal, round, and reactive to light.  Cardiovascular:     Rate and Rhythm: Normal rate and regular rhythm.     Pulses: Normal pulses.     Heart sounds: Normal heart sounds. No murmur heard.   No gallop.  Pulmonary:     Effort: Pulmonary effort is normal. No respiratory distress.     Breath sounds: Normal breath sounds. No wheezing, rhonchi or rales.  Abdominal:     General: Bowel sounds are normal. There is no distension.     Palpations: Abdomen is soft. There is no mass.     Tenderness: There is no abdominal tenderness. There is no guarding or rebound.     Hernia: No hernia is present.  Musculoskeletal:     Cervical back: Normal range of motion and neck supple.  Lymphadenopathy:     Cervical: No cervical adenopathy.  Skin:    General: Skin is warm and dry.  Neurological:     Mental Status: She is alert and oriented to person, place, and time.  Psychiatric:        Behavior: Behavior normal.  BP 98/60 (BP Location: Right Arm, Patient Position: Sitting, Cuff Size: Normal)   Pulse 66   Temp 97.8 F (36.6 C) (Oral)   Resp 18   Ht _0  (1.549 m)   Wt 127 lb 9.6 oz (57.9 kg)   SpO2 96%   BMI 24.11 kg/m  Wt Readings from Last 3 Encounters:  04/04/21 127 lb 9.6 oz (57.9 kg)  07/04/20 132 lb 4.4 oz (60 kg)  03/29/20 129 lb 6.4 oz  (58.7 kg)    Diabetic Foot Exam - Simple   No data filed    Lab Results  Component Value Date   WBC 3.8 (L) 03/29/2020   HGB 11.5 (L) 03/29/2020   HCT 36.0 03/29/2020   PLT 314 03/29/2020   GLUCOSE 198 (H) 07/01/2020   ALT 27 03/29/2020   AST 33 03/29/2020   NA 142 07/01/2020   K 4.5 07/01/2020   CL 105 07/01/2020   CREATININE 0.73 07/01/2020   BUN 12 07/01/2020   CO2 27 07/01/2020   HGBA1C 6.7 (H) 03/18/2016   MICROALBUR 12 06/11/2015    No results found for: TSH Lab Results  Component Value Date   WBC 3.8 (L) 03/29/2020   HGB 11.5 (L) 03/29/2020   HCT 36.0 03/29/2020   MCV 95.5 03/29/2020   PLT 314 03/29/2020   Lab Results  Component Value Date   NA 142 07/01/2020   K 4.5 07/01/2020   CHLORIDE 106 12/15/2016   CO2 27 07/01/2020   GLUCOSE 198 (H) 07/01/2020   BUN 12 07/01/2020   CREATININE 0.73 07/01/2020   BILITOT 0.4 03/29/2020   ALKPHOS 109 03/29/2020   AST 33 03/29/2020   ALT 27 03/29/2020   PROT 7.3 03/29/2020   ALBUMIN 3.8 03/29/2020   CALCIUM 8.8 (L) 07/01/2020   ANIONGAP 10 07/01/2020   EGFR 82 (L) 12/15/2016   No results found for: CHOL No results found for: HDL No results found for: LDLCALC No results found for: TRIG No results found for: CHOLHDL Lab Results  Component Value Date   HGBA1C 6.7 (H) 03/18/2016       Assessment & Plan:   Problem List Items Addressed This Visit       Unprioritized   Hair loss   Relevant Orders   Vitamin B12   CBC with Differential/Platelet   Comprehensive metabolic panel   Thyroid Panel With TSH   Hypothyroidism    Check labs Hypothyroid can cause hair loss and fatigue       Mild intermittent asthma - Primary   Relevant Medications   montelukast (SINGULAIR) 10 MG tablet   Other fatigue    Check labs        Relevant Orders   Vitamin B12   CBC with Differential/Platelet   Type 1 diabetes mellitus ([facility])    endo        Meds ordered this encounter  Medications   montelukast  (SINGULAIR) 10 MG tablet    Sig: Take 1 tablet (10 mg total) by mouth at bedtime.    Dispense:  90 tablet    Refill:  3    I,Zite Okoli,acting as a Education administrator for [employer], DO.,have documented all relevant documentation on the behalf of [name], DO,as directed by  [name], DO while in the presence of [name], DO.   I, [name], DO., personally preformed the services described in this documentation.  All medical record entries made by  the scribe were at my direction and in my presence.  I have reviewed the chart and discharge instructions (if applicable) and agree that the record reflects my personal performance and is accurate and complete. 04/04/2021

## 2021-04-04 NOTE — Assessment & Plan Note (Signed)
endo

## 2021-04-04 NOTE — Patient Instructions (Addendum)

## 2021-04-06 ENCOUNTER — Other Ambulatory Visit: Payer: Self-pay | Admitting: Family Medicine

## 2021-04-06 DIAGNOSIS — E039 Hypothyroidism, unspecified: Secondary | ICD-10-CM

## 2021-04-07 ENCOUNTER — Other Ambulatory Visit: Payer: Self-pay

## 2021-04-07 ENCOUNTER — Other Ambulatory Visit (HOSPITAL_COMMUNITY): Payer: Self-pay

## 2021-04-07 MED ORDER — LEVOTHYROXINE SODIUM 150 MCG PO TABS
150.0000 ug | ORAL_TABLET | Freq: Every day | ORAL | 1 refills | Status: DC
Start: 2021-04-07 — End: 2021-12-17
  Filled 2021-04-07: qty 90, 90d supply, fill #0
  Filled 2021-06-28: qty 90, 90d supply, fill #1

## 2021-05-13 ENCOUNTER — Ambulatory Visit
Admission: RE | Admit: 2021-05-13 | Discharge: 2021-05-13 | Disposition: A | Discharge status: Home / Self Care | Payer: 59 | Source: Ambulatory Visit | Attending: Oncology | Admitting: Oncology

## 2021-05-13 ENCOUNTER — Other Ambulatory Visit: Payer: Self-pay

## 2021-05-13 DIAGNOSIS — Z1231 Encounter for screening mammogram for malignant neoplasm of breast: Secondary | ICD-10-CM

## 2021-05-14 DIAGNOSIS — M65311 Trigger thumb, right thumb: Secondary | ICD-10-CM | POA: Diagnosis not present

## 2021-05-30 ENCOUNTER — Other Ambulatory Visit (HOSPITAL_COMMUNITY): Payer: Self-pay

## 2021-05-30 MED FILL — Glucose Blood Test Strip: 84 days supply | Qty: 500 | Fill #1 | Status: AC

## 2021-05-30 MED FILL — Insulin Lispro-aabc Inj 100 Unit/ML: INTRAMUSCULAR | 90 days supply | Qty: 40 | Fill #1 | Status: AC

## 2021-06-02 ENCOUNTER — Other Ambulatory Visit (HOSPITAL_COMMUNITY): Payer: Self-pay

## 2021-06-03 ENCOUNTER — Inpatient Hospital Stay: Payer: 59 | Attending: Oncology | Admitting: Oncology

## 2021-06-03 DIAGNOSIS — Z79899 Other long term (current) drug therapy: Secondary | ICD-10-CM | POA: Insufficient documentation

## 2021-06-03 DIAGNOSIS — Z853 Personal history of malignant neoplasm of breast: Secondary | ICD-10-CM | POA: Insufficient documentation

## 2021-06-03 DIAGNOSIS — C50411 Malignant neoplasm of upper-outer quadrant of right female breast: Secondary | ICD-10-CM

## 2021-06-03 NOTE — Progress Notes (Signed)
[place]  Telephone:([phone]) [phone] Fax:(336) [phone]     ID: Tiffany Browning DOB: Dec 22, 1955  MR#: [id number]  [facility]#:[id number]  Patient Care Team: Tiffany Browning, Tiffany Apa, DO as PCP - General (Family Medicine) Tiffany Bowen, MD as Consulting Physician (Endocrinology) Tiffany Kussmaul, MD as Consulting Physician (General Surgery) Tiffany Browning, Tiffany Dad, MD as Consulting Physician (Oncology) Tiffany Gibson, MD as Attending Physician (Radiation Oncology) Tiffany Phlegm, NP as Nurse Practitioner (Hematology and Oncology) OTHER MD:  I connected with Tiffany Browning on 06/03/21 at 12:30 PM EDT by telephone visit and verified that I am speaking with the correct person using two identifiers.   I discussed the limitations, risks, security and privacy concerns of performing an evaluation and management service by telemedicine and the availability of in-person appointments. I also discussed with the patient that there may be a patient responsible charge related to this service. The patient expressed understanding and agreed to proceed.   Other persons participating in the visit and their role in the encounter: None  Patient's location: Work Provider's location: [facility]   I provided 5 minutes of non face-to-face telephone visit time during this encounter, and > 50% was spent counseling as documented under my assessment & plan.   CHIEF COMPLAINT: Estrogen receptor positive breast cancer  CURRENT TREATMENT: Completing 5 years of amoxifen   INTERVAL HISTORY: Tiffany Browning was contacted today for follow-up and treatment of her estrogen receptor positive breast cancer.  She had not shown for her August visit which was to have been her "graduation" visit  She continues on tamoxifen. She has some mild hot flashes. She denies vaginal wetness.    Since her last visit, she underwent bilateral screening mammography with tomography at [facility] on 05/13/2021  showing: breast density category C; no evidence of malignancy in either breast.   She is scheduled for bone density screening on 09/05/2021.   REVIEW OF SYSTEMS: Tiffany Browning is doing "fine", with no new complaints or concerns.  BREAST CANCER HISTORY: From the original intake note:  Tiffany Browning had routine physical exam by her gynecologist in May 2017 and she was able to palpate a mass in the upper-outer quadrant of her right breast. The patient had had bilateral screening mammography 06/26/2015 at the [facility], with no suspicious findings. On 12/31/2015 she underwent bilateral diagnostic mammography with tomography and right breast ultrasonography. The breast density was category D. On focal spot compression in the area of palpable concern there was a possible mass, hard to make out because of the breast density. On exam there was a firm palpable lump measuring approximately 3 cm at the 9:00 region of the right breast, 4 cm from the nipple. The overlying skin was unremarkable. Ultrasonography confirmed an irregular hypoechoic mass in the area in question measuring 3.0 cm.  Also, separate from the palpable mass at the 9:00 position but 2 cm from the nipple was a hypoechoic oval mass measuring 2.0 cm. Ultrasound of the right axilla was negative.  Biopsy of both the masses in question from the right rest were obtained 01/09/2016. This showed (SAA 17-90-2) the mass closer to the nipple, S2 centimeters, to be a fibroadenoma. The more distant mass, 4 cm from the nipple, was invasive ductal carcinoma, E-cadherin positive, grade 2, estrogen receptor 100% positive, progesterone receptor 100% positive, with an MIB-1 of 30%, and no HER-2 amplification, the signals ratio being 1.43 and the number per cell 2.15.  The patient's subsequent history is as detailed  above.     [name] PAST MEDICAL HISTORY: Past Medical History:  Diagnosis Date   Allergy    Asthma    under control   Breast cancer ([place])    Diabetes  mellitus    Type 1-on insulin pump   Family history of breast cancer    Family history of kidney cancer    Heart murmur    Hypothyroidism    Personal history of radiation therapy    Thyroid disease    Hypothyroidism    PAST SURGICAL HISTORY: Past Surgical History:  Procedure Laterality Date   ABDOMINAL HYSTERECTOMY     APPENDECTOMY     BREAST BIOPSY Right 02/14/2016   BREAST BIOPSY Right 01/09/2016   BREAST LUMPECTOMY Right 03/26/2016   BREAST LUMPECTOMY WITH AXILLARY LYMPH NODE BIOPSY Right 03/26/2016   Procedure: RIGHT BREAST LUMPECTOMY WITH AXILLARY LYMPH NODE BIOPSY;  Surgeon: Tiffany Messing III, MD;  Location: [place];  Service: General;  Laterality: Right;   CARPAL TUNNEL RELEASE Right 02/09/2019   Procedure: RIGHT CARPAL TUNNEL RELEASE;  Surgeon: Tiffany Brod, MD;  Location: [place];  Service: Orthopedics;  Laterality: Right;   NASAL SINUS SURGERY     ruptured ovarian cyst     SHOULDER ARTHROSCOPY  2004   b/L --second one 2011   TONSILECTOMY, ADENOIDECTOMY, BILATERAL MYRINGOTOMY AND TUBES     TRIGGER FINGER RELEASE Right 07/04/2020   Procedure: RELEASE TRIGGER FINGER/A-1 PULLEY;  Surgeon: Tiffany Brod, MD;  Location: [place];  Service: Orthopedics;  Laterality: Right;  IV REGIONAL FOREARM BLOCK   TUBAL LIGATION     then reversed it    FAMILY HISTORY Family History  Problem Relation Age of Onset   Cancer Mother        cervical   Hypertension Mother    Hyperlipidemia Mother    Heart disease Father 43       MI---- cabg before that   Hypothyroidism Sister    Breast cancer Sister 29   Heart disease Brother 28       MI--- second one 52   Hyperlipidemia Brother    Hypertension Brother    Hypothyroidism Sister    Kidney cancer Other 18       son of her sister with breast cancer  The patient's father died from heart disease in his 59s in the setting of tobacco abuse. The patient's mother died from pneumonia at the age of 77 also in the  setting of tobacco abuse. The patient had one brother, 2 sisters. One of the sisters, Tiffany Browning, was diagnosed with breast cancer at the age of 87. The patient's mother may have had a history of cervical cancer. There is no history of ovarian cancer in the family.    GYNECOLOGIC HISTORY:  No LMP recorded. Patient has had a hysterectomy.  menarche 59. The patient is GX P0. [name] underwent total abdominal hysterectomy with bilateral salpingo-oophorectomy in 1990 for what was thought at that time might be ovarian cancer but proved to be a benign cyst. 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 She is taking courses in advanced coding at [employer]. Her husband [name] (ANTOINETTA BERRONES) works for [employer]. It's just the 2 of them plus their dog Jasmine at home.     ADVANCED DIRECTIVES:  in place: Both have a living will.    HEALTH MAINTENANCE: Social History  Tobacco Use   Smoking status: Never   Smokeless tobacco: Never  Vaping Use   Vaping Use: Never used  Substance Use Topics   Alcohol use: Yes    Comment: occ   Drug use: No     No Known Allergies   Current Outpatient Medications  Medication Sig Dispense Refill   albuterol (PROAIR HFA) 108 (90 Base) MCG/ACT inhaler      Alpha-Lipoic Acid 300 MG CAPS Take by mouth daily.     b complex vitamins tablet Take 1 tablet by mouth daily.     Biotin 10000 MCG TABS Take 1 tablet by mouth daily.     Blood Glucose Monitoring Suppl (FREESTYLE LITE) w/Device KIT USE AS DIRECTED 1 kit 3   Calcium Carb-Cholecalciferol (CALTRATE 600+D3) 600-800 MG-UNIT TABS 1x a day     Calcium-Magnesium-Vitamin D (CALCIUM MAGNESIUM PO) Take 1 tablet by mouth daily.     cephALEXin (KEFLEX) 500 MG capsule cephalexin 500 mg capsule     Cinnamon 500 MG capsule Take 500 mg by mouth daily.     COVID-19 mRNA vaccine, Pfizer, 30 MCG/0.3ML  injection INJECT AS DIRECTED .3 mL 0   Efinaconazole 10 % SOLN Apply 1 drop topically daily. 4 mL 11   fish oil-omega-3 fatty acids 1000 MG capsule Take 1 g by mouth daily.     gabapentin (NEURONTIN) 300 MG capsule 1 po qhs     glucose blood test strip USE TO CHECK BLOOD SUGAR 6 TIMES A DAY 550 strip 3   glucose blood test strip USE AS DIRECTED UP TO 8 TIMES DAILY 200 strip 3   HYDROcodone-acetaminophen (NORCO/VICODIN) 5-325 MG tablet hydrocodone 5 mg-acetaminophen 325 mg tablet     influenza vac split quadrivalent PF (AFLURIA QUADRIVALENT) 0.5 ML injection Afluria Qd 2019-20 (36 mos up)(PF)60 mcg (15 mcg x4)/0.5 mL IM syringe  TO BE ADMINISTERED BY PHARMACIST FOR IMMUNIZATION     insulin aspart (NOVOLOG) 100 UNIT/ML injection Per Dr Forde Dandy (Patient taking differently: Inject 0.3-0.4 Units into the skin continuous. Insulin pump-Per Dr Forde Dandy) 3 vial PRN   Insulin Lispro-aabc 100 UNIT/ML SOLN USE AS DIRECTED PER INSULIN PUMP UP TO 35 UNITS/DAY. 40 mL 3   levothyroxine (SYNTHROID) 175 MCG tablet TAKE 1 TABLET BY MOUTH ONCE DAILY MONDAY THRU SATURDAY. NONE ON SUNDAY 78 tablet 0   levothyroxine (SYNTHROID) 175 MCG tablet Take 1 tablet by mouth Monday through Saturday. None on Sunday 90 tablet 0   levothyroxine (SYNTHROID, LEVOTHROID) 175 MCG tablet Take 175 mcg by mouth daily. Take all days except on Sunday     montelukast (SINGULAIR) 10 MG tablet Take 10 mg by mouth at bedtime.     Multiple Vitamin (MULTIVITAMIN) tablet Take 1 tablet by mouth daily.     naproxen (NAPROSYN) 375 MG tablet Take 1 tablet (375 mg total) by mouth 3 (three) times daily with meals. 30 tablet 0   neomycin-polymyxin-hydrocortisone (CORTISPORIN) 3.5-10000-1 OTIC suspension 3 drops 3 (three) times daily.     rosuvastatin (CRESTOR) 5 MG tablet Take 5 mg by mouth daily.  3   rosuvastatin (CRESTOR) 5 MG tablet Take 1 tablet (5 mg total) by mouth daily. 90 tablet 0   tamoxifen (NOLVADEX) 20 MG tablet Take 1 tablet (20 mg total) by  mouth daily. 30 tablet 1   tamoxifen (NOLVADEX) 20 MG tablet TAKE 1 TABLET BY MOUTH ONCE DAILY 90 tablet 4   venlafaxine XR (EFFEXOR-XR) 75 MG 24 hr capsule TAKE 1 CAPSULE BY MOUTH DAILY WITH BREAKFAST 90  capsule 1   No current facility-administered medications for this visit.    OBJECTIVE:  There were no vitals filed for this visit.    There is no height or weight on file to calculate BMI.    ECOG FS:0 - Asymptomatic   Telemedicine visit 06/03/2021   LAB RESULTS:  CMP     Component Value Date/Time   NA 142 07/01/2020 1230   NA 140 12/15/2016 1234   K 4.5 07/01/2020 1230   K 4.8 12/15/2016 1234   CL 105 07/01/2020 1230   CO2 27 07/01/2020 1230   CO2 28 12/15/2016 1234   GLUCOSE 198 (H) 07/01/2020 1230   GLUCOSE 133 12/15/2016 1234   BUN 12 07/01/2020 1230   BUN 14.6 12/15/2016 1234   CREATININE 0.73 07/01/2020 1230   CREATININE 0.86 03/29/2020 0919   CREATININE 0.8 12/15/2016 1234   CALCIUM 8.8 (L) 07/01/2020 1230   CALCIUM 9.0 12/15/2016 1234   PROT 7.3 03/29/2020 0919   PROT 7.2 12/15/2016 1234   ALBUMIN 3.8 03/29/2020 0919   ALBUMIN 3.7 12/15/2016 1234   AST 33 03/29/2020 0919   AST 31 12/15/2016 1234   ALT 27 03/29/2020 0919   ALT 30 12/15/2016 1234   ALKPHOS 109 03/29/2020 0919   ALKPHOS 109 12/15/2016 1234   BILITOT 0.4 03/29/2020 0919   BILITOT <0.22 12/15/2016 1234   GFRNONAA >60 07/01/2020 1230   GFRNONAA >60 03/29/2020 0919   GFRAA >60 03/29/2020 0919    INo results found for: SPEP, UPEP  Lab Results  Component Value Date   WBC 3.8 (L) 03/29/2020   NEUTROABS 2.0 03/29/2020   HGB 11.5 (L) 03/29/2020   HCT 36.0 03/29/2020   MCV 95.5 03/29/2020   PLT 314 03/29/2020      Chemistry      Component Value Date/Time   NA 142 07/01/2020 1230   NA 140 12/15/2016 1234   K 4.5 07/01/2020 1230   K 4.8 12/15/2016 1234   CL 105 07/01/2020 1230   CO2 27 07/01/2020 1230   CO2 28 12/15/2016 1234   BUN 12 07/01/2020 1230   BUN 14.6 12/15/2016 1234    CREATININE 0.73 07/01/2020 1230   CREATININE 0.86 03/29/2020 0919   CREATININE 0.8 12/15/2016 1234      Component Value Date/Time   CALCIUM 8.8 (L) 07/01/2020 1230   CALCIUM 9.0 12/15/2016 1234   ALKPHOS 109 03/29/2020 0919   ALKPHOS 109 12/15/2016 1234   AST 33 03/29/2020 0919   AST 31 12/15/2016 1234   ALT 27 03/29/2020 0919   ALT 30 12/15/2016 1234   BILITOT 0.4 03/29/2020 0919   BILITOT <0.22 12/15/2016 1234       No results found for: LABCA2  No components found for: LABCA125  No results for input(s): INR in the last 168 hours.  Urinalysis    Component Value Date/Time   COLORURINE yellow 02/06/2010 1109   APPEARANCEUR Clear 02/06/2010 1109   LABSPEC 1.015 02/06/2010 1109   PHURINE 6.5 02/06/2010 1109   HGBUR negative 02/06/2010 1109   BILIRUBINUR negative 01/20/2018 1021   BILIRUBINUR negative 03/07/2016 1150   KETONESUR negative 01/20/2018 1021   PROTEINUR negative 01/20/2018 1021   PROTEINUR negative 03/07/2016 1150   UROBILINOGEN 0.2 01/20/2018 1021   UROBILINOGEN 0.2 02/06/2010 1109   NITRITE Negative 01/20/2018 1021   NITRITE negative 03/07/2016 1150   NITRITE negative 02/06/2010 1109   LEUKOCYTESUR Negative 01/20/2018 1021    STUDIES: No results found.   ELIGIBLE FOR AVAILABLE RESEARCH  PROTOCOL: PALLAS  ASSESSMENT: 65 y.o. [place], [place] woman status post right breast upper outer quadrant biopsy 01/09/2016 for a clinical T2 N0, stage IIA invasive ductal carcinoma, grade 2, estrogen and progesterone receptor positive, HER-2 nonamplified, with an MIB-1 of 30%.   (a) biopsy of a second upper outer quadrant area of concern 02/14/2016 was benign  (1) neoadjuvant tamoxifen started 01/28/2016  (2) right lumpectomy and sentinel lymph node sampling 03/26/2016 showed a pT2 pN0, stage IIA invasive ductal carcinoma, grade 2, with negative margins  (3) Oncotype DX score of 11 predicts a 10 year risk of recurrence outside the breast of 8% if the patient's only  systemic therapy is tamoxifen for 5 years. It also predicted no benefit from chemotherapy  (4) adjuvant radiation 05/14/2016 to 06/10/2016  Site/dose:    1. The Right breast was treated to 40.05 Gy in 15 fractions at 2.67 Gy per fraction.  2. The Right breast was boosted to 10 Gy in 5 fractions at 2 Gy per fraction.  (5) tamoxifen continued through local treatment--completing 5 years October 2022   (6) Genetics testing of the patient's nephew (kidney cancer at age 22) has been recommended   PLAN: Bailea is just a little over 5 years out from definitive surgery for her breast cancer with no evidence of disease recurrence.  This is very favorable.  She has completed 5 years of tamoxifen.  She is going off this medication at this point.  She had tolerated it well and I do not anticipate any significant changes in symptomatology from going off the medication  At this point I feel comfortable releasing her to her primary care physicians.  All she will need in terms of breast cancer follow-up is her yearly mammography and a yearly physician breast exam  We will be glad to see Janissa again at any point in the future if and when the need arises as of now are making no further routine appointments for her here  Total encounter time 8 minutes.Sarajane Jews C. Asa Fath, MD 03/30/21 4:37 PM Medical Oncology and Hematology [facility] [place], [place] [address] Tel. [phone]    Fax. [phone]   I, Wilburn Mylar, am acting as scribe for Dr. Virgie Browning. Estefan Pattison.  I, Lurline Del MD, have reviewed the above documentation for accuracy and completeness, and I agree with the above.   *Total Encounter Time as defined by the Centers for Medicare and Medicaid Services includes, in addition to the face-to-face time of a patient visit (documented in the note above) non-face-to-face time: obtaining and reviewing outside history, ordering and reviewing medications,  tests or procedures, care coordination (communications with other health care professionals or caregivers) and documentation in the medical record.

## 2021-06-20 ENCOUNTER — Other Ambulatory Visit (HOSPITAL_COMMUNITY): Payer: Self-pay

## 2021-06-20 DIAGNOSIS — E1042 Type 1 diabetes mellitus with diabetic polyneuropathy: Secondary | ICD-10-CM | POA: Diagnosis not present

## 2021-06-20 DIAGNOSIS — J45909 Unspecified asthma, uncomplicated: Secondary | ICD-10-CM | POA: Diagnosis not present

## 2021-06-20 DIAGNOSIS — E039 Hypothyroidism, unspecified: Secondary | ICD-10-CM | POA: Diagnosis not present

## 2021-06-20 DIAGNOSIS — Z4681 Encounter for fitting and adjustment of insulin pump: Secondary | ICD-10-CM | POA: Diagnosis not present

## 2021-06-20 DIAGNOSIS — Z794 Long term (current) use of insulin: Secondary | ICD-10-CM | POA: Diagnosis not present

## 2021-06-20 DIAGNOSIS — R0609 Other forms of dyspnea: Secondary | ICD-10-CM | POA: Diagnosis not present

## 2021-06-20 DIAGNOSIS — C50411 Malignant neoplasm of upper-outer quadrant of right female breast: Secondary | ICD-10-CM | POA: Diagnosis not present

## 2021-06-20 MED ORDER — VENLAFAXINE HCL ER 75 MG PO CP24
75.0000 mg | ORAL_CAPSULE | Freq: Every day | ORAL | 3 refills | Status: DC
  Filled 2021-06-20: qty 90, 90d supply, fill #0
  Filled 2021-09-15: qty 90, 90d supply, fill #1

## 2021-06-23 ENCOUNTER — Other Ambulatory Visit: Payer: Self-pay | Admitting: Endocrinology

## 2021-06-23 DIAGNOSIS — R0609 Other forms of dyspnea: Secondary | ICD-10-CM

## 2021-06-28 ENCOUNTER — Other Ambulatory Visit (HOSPITAL_COMMUNITY): Payer: Self-pay

## 2021-07-14 ENCOUNTER — Ambulatory Visit
Admission: RE | Admit: 2021-07-14 | Discharge: 2021-07-14 | Disposition: A | Discharge status: Home / Self Care | Payer: No Typology Code available for payment source | Source: Ambulatory Visit | Attending: Endocrinology | Admitting: Endocrinology

## 2021-07-14 DIAGNOSIS — R0609 Other forms of dyspnea: Secondary | ICD-10-CM

## 2021-08-04 ENCOUNTER — Other Ambulatory Visit (HOSPITAL_COMMUNITY): Payer: Self-pay

## 2021-08-07 ENCOUNTER — Other Ambulatory Visit (HOSPITAL_COMMUNITY): Payer: Self-pay

## 2021-08-17 HISTORY — PX: CATARACT EXTRACTION, BILATERAL: SHX1313

## 2021-08-25 ENCOUNTER — Other Ambulatory Visit (HOSPITAL_COMMUNITY): Payer: Self-pay

## 2021-08-25 MED FILL — Insulin Lispro-aabc Inj 100 Unit/ML: INTRAMUSCULAR | 90 days supply | Qty: 40 | Fill #2 | Status: AC

## 2021-08-26 ENCOUNTER — Other Ambulatory Visit (HOSPITAL_COMMUNITY): Payer: Self-pay

## 2021-09-05 ENCOUNTER — Ambulatory Visit
Admission: RE | Admit: 2021-09-05 | Discharge: 2021-09-05 | Disposition: A | Discharge status: Home / Self Care | Payer: 59 | Source: Ambulatory Visit | Attending: Nurse Practitioner | Admitting: Nurse Practitioner

## 2021-09-05 DIAGNOSIS — M8589 Other specified disorders of bone density and structure, multiple sites: Secondary | ICD-10-CM | POA: Diagnosis not present

## 2021-09-05 DIAGNOSIS — Z780 Asymptomatic menopausal state: Secondary | ICD-10-CM | POA: Diagnosis not present

## 2021-09-05 DIAGNOSIS — M8580 Other specified disorders of bone density and structure, unspecified site: Secondary | ICD-10-CM

## 2021-09-15 ENCOUNTER — Other Ambulatory Visit: Payer: Self-pay | Admitting: Family Medicine

## 2021-09-15 ENCOUNTER — Other Ambulatory Visit (HOSPITAL_COMMUNITY): Payer: Self-pay

## 2021-09-15 MED ORDER — LEVOTHYROXINE SODIUM 175 MCG PO TABS
175.0000 ug | ORAL_TABLET | Freq: Every day | ORAL | 0 refills | Status: DC
  Filled 2021-09-15: qty 78, 90d supply, fill #0

## 2021-09-17 ENCOUNTER — Other Ambulatory Visit (HOSPITAL_COMMUNITY): Payer: Self-pay

## 2021-10-09 DIAGNOSIS — E039 Hypothyroidism, unspecified: Secondary | ICD-10-CM | POA: Diagnosis not present

## 2021-10-09 DIAGNOSIS — Z4681 Encounter for fitting and adjustment of insulin pump: Secondary | ICD-10-CM | POA: Diagnosis not present

## 2021-10-09 DIAGNOSIS — Z794 Long term (current) use of insulin: Secondary | ICD-10-CM | POA: Diagnosis not present

## 2021-10-09 DIAGNOSIS — J45909 Unspecified asthma, uncomplicated: Secondary | ICD-10-CM | POA: Diagnosis not present

## 2021-10-09 DIAGNOSIS — Z853 Personal history of malignant neoplasm of breast: Secondary | ICD-10-CM | POA: Diagnosis not present

## 2021-10-09 DIAGNOSIS — E1042 Type 1 diabetes mellitus with diabetic polyneuropathy: Secondary | ICD-10-CM | POA: Diagnosis not present

## 2021-10-31 ENCOUNTER — Other Ambulatory Visit (HOSPITAL_COMMUNITY): Payer: Self-pay

## 2021-10-31 DIAGNOSIS — M65311 Trigger thumb, right thumb: Secondary | ICD-10-CM | POA: Diagnosis not present

## 2021-10-31 DIAGNOSIS — G5602 Carpal tunnel syndrome, left upper limb: Secondary | ICD-10-CM | POA: Diagnosis not present

## 2021-10-31 DIAGNOSIS — M65331 Trigger finger, right middle finger: Secondary | ICD-10-CM | POA: Diagnosis not present

## 2021-10-31 DIAGNOSIS — M65332 Trigger finger, left middle finger: Secondary | ICD-10-CM | POA: Diagnosis not present

## 2021-10-31 MED ORDER — METHYLPREDNISOLONE 4 MG PO TBPK
ORAL_TABLET | ORAL | 0 refills | Status: DC
  Filled 2021-10-31: qty 21, 6d supply, fill #0

## 2021-11-06 ENCOUNTER — Other Ambulatory Visit: Payer: Self-pay

## 2021-11-06 ENCOUNTER — Other Ambulatory Visit (HOSPITAL_COMMUNITY): Payer: Self-pay

## 2021-11-12 DIAGNOSIS — E1165 Type 2 diabetes mellitus with hyperglycemia: Secondary | ICD-10-CM | POA: Diagnosis not present

## 2021-11-18 ENCOUNTER — Other Ambulatory Visit (HOSPITAL_COMMUNITY): Payer: Self-pay

## 2021-11-18 ENCOUNTER — Other Ambulatory Visit: Payer: Self-pay

## 2021-11-20 ENCOUNTER — Other Ambulatory Visit (HOSPITAL_COMMUNITY): Payer: Self-pay

## 2021-11-20 MED ORDER — LYUMJEV 100 UNIT/ML IJ SOLN
INTRAMUSCULAR | 3 refills | Status: AC
  Filled 2021-11-20: qty 30, 85d supply, fill #0
  Filled 2022-03-09: qty 30, 85d supply, fill #1

## 2021-11-20 MED ORDER — FREESTYLE LITE TEST VI STRP
ORAL_STRIP | 3 refills | Status: DC
  Filled 2021-11-20: qty 600, 90d supply, fill #0
  Filled 2022-03-09: qty 600, 90d supply, fill #1

## 2021-11-26 DIAGNOSIS — G5612 Other lesions of median nerve, left upper limb: Secondary | ICD-10-CM | POA: Diagnosis not present

## 2021-12-12 ENCOUNTER — Encounter: Payer: Self-pay | Admitting: Family Medicine

## 2021-12-12 ENCOUNTER — Other Ambulatory Visit: Payer: Self-pay | Admitting: Family Medicine

## 2021-12-12 ENCOUNTER — Other Ambulatory Visit (HOSPITAL_COMMUNITY): Payer: Self-pay

## 2021-12-12 ENCOUNTER — Ambulatory Visit (INDEPENDENT_AMBULATORY_CARE_PROVIDER_SITE_OTHER): Payer: 59 | Admitting: Family Medicine

## 2021-12-12 ENCOUNTER — Other Ambulatory Visit: Payer: Self-pay | Admitting: Orthopedic Surgery

## 2021-12-12 VITALS — BP 122/70 | HR 65 | Temp 97.8°F | Resp 16 | Ht 61.0 in | Wt 135.2 lb

## 2021-12-12 DIAGNOSIS — G5603 Carpal tunnel syndrome, bilateral upper limbs: Secondary | ICD-10-CM

## 2021-12-12 DIAGNOSIS — J4520 Mild intermittent asthma, uncomplicated: Secondary | ICD-10-CM

## 2021-12-12 DIAGNOSIS — E785 Hyperlipidemia, unspecified: Secondary | ICD-10-CM | POA: Diagnosis not present

## 2021-12-12 DIAGNOSIS — G5602 Carpal tunnel syndrome, left upper limb: Secondary | ICD-10-CM | POA: Diagnosis not present

## 2021-12-12 DIAGNOSIS — E1165 Type 2 diabetes mellitus with hyperglycemia: Secondary | ICD-10-CM | POA: Diagnosis not present

## 2021-12-12 DIAGNOSIS — E039 Hypothyroidism, unspecified: Secondary | ICD-10-CM

## 2021-12-12 DIAGNOSIS — Z1159 Encounter for screening for other viral diseases: Secondary | ICD-10-CM | POA: Diagnosis not present

## 2021-12-12 LAB — COMPREHENSIVE METABOLIC PANEL
ALT: 29 U/L (ref 0–35)
AST: 33 U/L (ref 0–37)
Albumin: 4.1 g/dL (ref 3.5–5.2)
Alkaline Phosphatase: 106 U/L (ref 39–117)
BUN: 17 mg/dL (ref 6–23)
CO2: 30 mEq/L (ref 19–32)
Calcium: 9.1 mg/dL (ref 8.4–10.5)
Chloride: 103 mEq/L (ref 96–112)
Creatinine, Ser: 0.72 mg/dL (ref 0.40–1.20)
GFR: 88.29 mL/min (ref >60.00)
Glucose, Bld: 152 mg/dL — ABNORMAL HIGH (ref 70–99)
Potassium: 4.3 mEq/L (ref 3.5–5.1)
Sodium: 139 mEq/L (ref 135–145)
Total Bilirubin: 0.3 mg/dL (ref 0.2–1.2)
Total Protein: 6.6 g/dL (ref 6.0–8.3)

## 2021-12-12 LAB — CBC WITH DIFFERENTIAL/PLATELET
Basophils Absolute: 0 10*3/uL (ref 0.0–0.1)
Basophils Relative: 0.2 % (ref 0.0–3.0)
Eosinophils Absolute: 0.3 10*3/uL (ref 0.0–0.7)
Eosinophils Relative: 5.9 % — ABNORMAL HIGH (ref 0.0–5.0)
HCT: 33 % — ABNORMAL LOW (ref 36.0–46.0)
Hemoglobin: 11.2 g/dL — ABNORMAL LOW (ref 12.0–15.0)
Lymphocytes Relative: 21.1 % (ref 12.0–46.0)
Lymphs Abs: 1.1 10*3/uL (ref 0.7–4.0)
MCHC: 33.9 g/dL (ref 30.0–36.0)
MCV: 93.3 fl (ref 78.0–100.0)
Monocytes Absolute: 0.5 10*3/uL (ref 0.1–1.0)
Monocytes Relative: 8.8 % (ref 3.0–12.0)
Neutro Abs: 3.3 10*3/uL (ref 1.4–7.7)
Neutrophils Relative %: 64 % (ref 43.0–77.0)
Platelets: 344 10*3/uL (ref 150.0–400.0)
RBC: 3.53 Mil/uL — ABNORMAL LOW (ref 3.87–5.11)
RDW: 12.8 % (ref 11.5–15.5)
WBC: 5.2 10*3/uL (ref 4.0–10.5)

## 2021-12-12 LAB — LIPID PANEL
Cholesterol: 163 mg/dL (ref 0–200)
HDL: 59.7 mg/dL (ref >39.00)
LDL Cholesterol: 86 mg/dL (ref 0–99)
NonHDL: 103.75
Total CHOL/HDL Ratio: 3
Triglycerides: 88 mg/dL (ref 0.0–149.0)
VLDL: 17.6 mg/dL (ref 0.0–40.0)

## 2021-12-12 LAB — TSH: TSH: 0.12 u[IU]/mL — ABNORMAL LOW (ref 0.35–5.50)

## 2021-12-12 MED ORDER — ALBUTEROL SULFATE HFA 108 (90 BASE) MCG/ACT IN AERS
2.0000 | INHALATION_SPRAY | Freq: Four times a day (QID) | RESPIRATORY_TRACT | 2 refills | Status: DC | PRN
  Filled 2021-12-12: qty 6.7, 25d supply, fill #0
  Filled 2022-01-19: qty 13.4, 50d supply, fill #1

## 2021-12-12 MED ORDER — PROAIR RESPICLICK 108 (90 BASE) MCG/ACT IN AEPB
INHALATION_SPRAY | RESPIRATORY_TRACT | 5 refills | Status: DC
  Filled 2021-12-12: qty 1, 90d supply, fill #0

## 2021-12-12 MED ORDER — PROAIR RESPICLICK 108 (90 BASE) MCG/ACT IN AEPB
INHALATION_SPRAY | RESPIRATORY_TRACT | 5 refills | Status: DC
  Filled 2021-12-12: qty 1, 25d supply, fill #0

## 2021-12-12 MED ORDER — QVAR REDIHALER 40 MCG/ACT IN AERB
2.0000 | INHALATION_SPRAY | Freq: Two times a day (BID) | RESPIRATORY_TRACT | 5 refills | Status: DC
  Filled 2021-12-12: qty 10.6, 30d supply, fill #0

## 2021-12-12 NOTE — Assessment & Plan Note (Signed)
Check labs con't synthroid 

## 2021-12-12 NOTE — Assessment & Plan Note (Signed)
Will have surgery soon with Dr Fredna Dow ?

## 2021-12-12 NOTE — Progress Notes (Signed)
? ?Subjective:  ? ?By signing my name below, I, Zite Okoli, attest that this documentation has been prepared under the direction and in the presence of [name], DO. 12/12/2021   ? ? Patient ID: Tiffany Browning, female    DOB: July 01, 1956, 66 y.o.   MRN: [id number] ? ?Chief Complaint  ?Patient presents with  ? Hypothyroidism  ? Diabetes  ? Follow-up  ? ? ?HPI ?Patient is in today for an office visit and f/u ? ?She is complaining of allergies as the season changes. She would like a refill on proair inhaler.  ? ?She reports she is still experiencing hair loss. Noes there has been an improvement since she started synthroid. ? ?Past Medical History:  ?Diagnosis Date  ? Allergy   ? Asthma   ? under control  ? Breast cancer ([place])   ? Diabetes mellitus   ? Type 1-on insulin pump  ? Family history of breast cancer   ? Family history of kidney cancer   ? Heart murmur   ? Hypothyroidism   ? Personal history of radiation therapy   ? Thyroid disease   ? Hypothyroidism  ? ? ?Past Surgical History:  ?Procedure Laterality Date  ? ABDOMINAL HYSTERECTOMY    ? APPENDECTOMY    ? BREAST BIOPSY Right 02/14/2016  ? BREAST BIOPSY Right 01/09/2016  ? BREAST LUMPECTOMY Right 03/26/2016  ? BREAST LUMPECTOMY WITH AXILLARY LYMPH NODE BIOPSY Right 03/26/2016  ? Procedure: RIGHT BREAST LUMPECTOMY WITH AXILLARY LYMPH NODE BIOPSY;  Surgeon: Autumn Messing III, MD;  Location: [place];  Service: General;  Laterality: Right;  ? CARPAL TUNNEL RELEASE Right 02/09/2019  ? Procedure: RIGHT CARPAL TUNNEL RELEASE;  Surgeon: Daryll Brod, MD;  Location: [facility];  Service: Orthopedics;  Laterality: Right;  ? NASAL SINUS SURGERY    ? ruptured ovarian cyst    ? SHOULDER ARTHROSCOPY  2004  ? b/L --second one 2011  ? TONSILECTOMY, ADENOIDECTOMY, BILATERAL MYRINGOTOMY AND TUBES    ? TRIGGER FINGER RELEASE Right 07/04/2020  ? Procedure: RELEASE TRIGGER FINGER/A-1 PULLEY;  Surgeon: Daryll Brod, MD;  Location: [facility];   Service: Orthopedics;  Laterality: Right;  IV REGIONAL FOREARM BLOCK  ? TUBAL LIGATION    ? then reversed it  ? ? ?Family History  ?Problem Relation Age of Onset  ? Cancer Mother   ?     cervical  ? Hypertension Mother   ? Hyperlipidemia Mother   ? Heart disease Father 69  ?     MI---- cabg before that  ? Hypothyroidism Sister   ? Breast cancer Sister 41  ? Heart disease Brother 78  ?     MI--- second one 46  ? Hyperlipidemia Brother   ? Hypertension Brother   ? Hypothyroidism Sister   ? Kidney cancer Other 31  ?     son of her sister with breast cancer  ? ? ?Social History  ? ?Socioeconomic History  ? Marital status: Married  ?  Spouse name: [name]  ? Number of children: 0  ? Years of education: Not on file  ? Highest education level: Not on file  ?Occupational History  ? 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 Smoking status: Never  ? Smokeless tobacco: Never  ?Vaping Use  ? Vaping Use: Never used  ?Substance and Sexual Activity  ? Alcohol use: Yes  ?  Comment: occ  ? Drug use: No  ? Sexual activity: Yes  ?  Partners: Male  ?Other Topics Concern  ? Not on file  ?Social History Narrative  ? Not on file  ? ?Social Determinants of Health  ? ?Financial Resource Strain: Not on file  ?Food Insecurity: Not on file  ?Transportation Needs: Not on file  ?Physical Activity: Not on file  ?Stress: Not on file  ?Social Connections: Not on file  ?Intimate Partner Violence: Not on file  ? ? ?Outpatient Medications Prior to Visit  ?Medication Sig Dispense Refill  ? Alpha-Lipoic Acid 300 MG CAPS Take by mouth daily.    ? Biotin 10000 MCG TABS Take 1 tablet by mouth daily.    ? Calcium Carb-Cholecalciferol (CALTRATE 600+D3) 600-800 MG-UNIT TABS 1x a day    ? Calcium-Magnesium-Vitamin D (CALCIUM MAGNESIUM PO) Take 1 tablet by mouth daily.    ? Cinnamon 500 MG capsule Take 500 mg by mouth daily.    ? Efinaconazole 10 % SOLN Apply 1 drop topically daily. 4 mL 11  ? fish oil-omega-3 fatty acids 1000 MG capsule  Take 1 g by mouth daily.    ? glucose blood (FREESTYLE LITE) test strip Check blood sugar 6 times a day 600 each 3  ? influenza vac split quadrivalent PF (AFLURIA QUADRIVALENT) 0.5 ML injection Afluria Qd 2019-20 (36 mos up)(PF)60 mcg (15 mcg x4)/0.5 mL IM syringe ? TO BE ADMINISTERED BY PHARMACIST FOR IMMUNIZATION    ? Insulin Lispro-aabc (LYUMJEV) 100 UNIT/ML SOLN Inject up to 35 Units as directed with insulin pump 40 mL 3  ? levothyroxine (SYNTHROID) 150 MCG tablet Take 1 tablet (150 mcg total) by mouth daily. 90 tablet 1  ? montelukast (SINGULAIR) 10 MG tablet Take 1 tablet (10 mg total) by mouth at bedtime. 90 tablet 3  ? Multiple Vitamin (MULTIVITAMIN) tablet Take 1 tablet by mouth daily.    ? neomycin-polymyxin-hydrocortisone (CORTISPORIN) 3.5-10000-1 OTIC suspension 3 drops 3 (three) times daily.    ? rosuvastatin (CRESTOR) 5 MG tablet Take 1 tablet (5 mg total) by mouth daily. 90 tablet 3  ? albuterol (VENTOLIN HFA) 108 (90 Base) MCG/ACT inhaler     ? venlafaxine XR (EFFEXOR XR) 75 MG 24 hr capsule Take 1 capsule (75 mg total) by mouth daily with food 90 capsule 3  ? b complex vitamins tablet Take 1 tablet by mouth daily.    ? insulin aspart (NOVOLOG) 100 UNIT/ML injection Per Dr Forde Dandy (Patient not taking: Reported on 12/12/2021) 3 vial PRN  ? levothyroxine (SYNTHROID) 175 MCG tablet Take 1 tablet (175 mcg total) by mouth daily before breakfast. Take all days except on Sunday (Patient not taking: Reported on 12/12/2021) 90 tablet 0  ? methylPREDNISolone (MEDROL DOSEPAK) 4 MG TBPK tablet Take as directed per package instructions (Patient not taking: Reported on 12/12/2021) 21 tablet 0  ? tamoxifen (NOLVADEX) 20 MG tablet Take 1 tablet (20 mg total) by mouth daily. (Patient not taking: Reported on 12/12/2021) 30 tablet 1  ? venlafaxine XR (EFFEXOR-XR) 75 MG 24 hr capsule TAKE 1 CAPSULE BY MOUTH DAILY WITH BREAKFAST (Patient not taking: Reported on 12/12/2021) 90 capsule 1  ? ?No facility-administered medications  prior to visit.  ? ? ?No Known Allergies ? ?Review of Systems  ?Constitutional:  Negative for fever.  ?     (+) hair loss  ?HENT:  Negative for congestion, ear pain, hearing loss, sinus pain and sore throat.   ?Eyes:  Negative for blurred vision and pain.  ?Respiratory:  Negative for cough, sputum production, shortness of breath and wheezing.   ?  Cardiovascular:  Negative for chest pain and palpitations.  ?Gastrointestinal:  Negative for blood in stool, constipation, diarrhea, nausea and vomiting.  ?Genitourinary:  Negative for dysuria, frequency, hematuria and urgency.  ?Musculoskeletal:  Negative for back pain, falls and myalgias.  ?Neurological:  Negative for dizziness, sensory change, loss of consciousness, weakness and headaches.  ?Endo/Heme/Allergies:  Positive for environmental allergies. Does not bruise/bleed easily.  ?Psychiatric/Behavioral:  Negative for depression and suicidal ideas. The patient is not nervous/anxious and does not have insomnia.   ? ?   ?Objective:  ?  ?Physical Exam ?Constitutional:   ?   General: She is not in acute distress. ?   Appearance: Normal appearance. She is not ill-appearing.  ?HENT:  ?   Head: Normocephalic and atraumatic.  ?   Right Ear: External ear normal.  ?   Left Ear: External ear normal.  ?Eyes:  ?   Extraocular Movements: Extraocular movements intact.  ?   Pupils: Pupils are equal, round, and reactive to light.  ?Cardiovascular:  ?   Rate and Rhythm: Normal rate and regular rhythm.  ?   Pulses: Normal pulses.  ?   Heart sounds: Normal heart sounds. No murmur heard. ?  No gallop.  ?Pulmonary:  ?   Effort: Pulmonary effort is normal. No respiratory distress.  ?   Breath sounds: Wheezing present. No rhonchi or rales.  ?Abdominal:  ?   General: Bowel sounds are normal. There is no distension.  ?   Palpations: Abdomen is soft. There is no mass.  ?   Tenderness: There is no abdominal tenderness. There is no guarding or rebound.  ?   Hernia: No hernia is present.   ?Musculoskeletal:  ?   Cervical back: Normal range of motion and neck supple.  ?Lymphadenopathy:  ?   Cervical: No cervical adenopathy.  ?Skin: ?   General: Skin is warm and dry.  ?Neurological:  ?   Mental Status: She i

## 2021-12-12 NOTE — Assessment & Plan Note (Signed)
Encourage heart healthy diet such as MIND or DASH diet, increase exercise, avoid trans fats, simple carbohydrates and processed foods, consider a krill or fish or flaxseed oil cap daily.  °

## 2021-12-12 NOTE — Assessment & Plan Note (Signed)
Using albuterol more often ?Add qvar  ?

## 2021-12-12 NOTE — Patient Instructions (Signed)
Asthma, Adult ? ?Asthma is a long-term (chronic) condition that causes recurrent episodes in which the lower airways in the lungs become tight and narrow. The narrowing is caused by inflammation and tightening of the smooth muscle around the lower airways. ?Asthma episodes, also called asthma attacks or asthma flares, may cause coughing, making high-pitched whistling sounds when you breathe, most often when you breathe out (wheezing), shortness of breath, and chest pain. The airways may produce extra mucus caused by the inflammation and irritation. During an attack, it can be difficult to breathe. Asthma attacks can range from minor to life-threatening. ?Asthma cannot be cured, but medicines and lifestyle changes can help control it and treat acute attacks. It is important to keep your asthma well controlled so the condition does not interfere with your daily life. ?What are the causes? ?This condition is believed to be caused by inherited (genetic) and environmental factors, but its exact cause is not known. ?What can trigger an asthma attack? ?Many things can bring on an asthma attack or make symptoms worse. These triggers are different for every person. Common triggers include: ?Allergens and irritants like mold, dust, pet dander, cockroaches, pollen, air pollution, and chemical odors. ?Cigarette smoke. ?Weather changes and cold air. ?Stress and strong emotional responses such as crying or laughing hard. ?Certain medications such as aspirin or beta blockers. ?Infections and inflammatory conditions, such as the flu, a cold, pneumonia, or inflammation of the nasal membranes (rhinitis). ?Gastroesophageal reflux disease (GERD). ?What are the signs or symptoms? ?Symptoms may occur right after exposure to an asthma trigger or hours later and can vary by person. Common signs and symptoms include: ?Wheezing. ?Trouble breathing (shortness of breath). ?Excessive nighttime or early morning coughing. ?Chest  tightness. ?Tiredness (fatigue) with minimal activity. ?Difficulty talking in complete sentences. ?Poor exercise tolerance. ?How is this diagnosed? ?This condition is diagnosed based on: ?A physical exam and your medical history. ?Tests, which may include: ?Lung function studies to evaluate the flow of air in your lungs. ?Allergy tests. ?Imaging tests, such as X-rays. ?How is this treated? ?There is no cure, but symptoms can be controlled with proper treatment. Treatment usually involves: ?Identifying and avoiding your asthma triggers. ?Inhaled medicines. Two types are commonly used to treat asthma, depending on severity: ?Controller medicines. These help prevent asthma symptoms from occurring. They are taken every day. ?Fast-acting reliever or rescue medicines. These quickly relieve asthma symptoms. They are used as needed and provide short-term relief. ?Using other medicines, such as: ?Allergy medicines, such as antihistamines, if your asthma attacks are triggered by allergens. ?Immune medicines (immunomodulators). These are medicines that help control the immune system. ?Using supplemental oxygen. This is only needed during a severe episode. ?Creating an asthma action plan. An asthma action plan is a written plan for managing and treating your asthma attacks. This plan includes: ?A list of your asthma triggers and how to avoid them. ?Information about when medicines should be taken and when their dosage should be changed. ?Instructions about using a device called a peak flow meter. A peak flow meter measures how well the lungs are working and the severity of your asthma. It helps you monitor your condition. ?Follow these instructions at home: ?Take over-the-counter and prescription medicines only as told by your health care provider. ?Stay up to date on all vaccinations as recommended by your healthcare provider, including vaccines for the flu and pneumonia. ?Use a peak flow meter and keep track of your peak flow  readings. ?Understand and use your asthma   action plan to address any asthma flares. ?Do not smoke or allow anyone to smoke in your home. ?Contact a health care provider if: ?You have wheezing, shortness of breath, or a cough that is not responding to medicines. ?Your medicines are causing side effects, such as a rash, itching, swelling, or trouble breathing. ?You need to use a reliever medicine more than 2-3 times a week. ?Your peak flow reading is still at 50-79% of your personal best after following your action plan for 1 hour. ?You have a fever and shortness of breath. ?Get help right away if: ?You are getting worse and do not respond to treatment during an asthma attack. ?You are short of breath when at rest or when doing very little physical activity. ?You have difficulty eating, drinking, or talking. ?You have chest pain or tightness. ?You develop a fast heartbeat or palpitations. ?You have a bluish color to your lips or fingernails. ?You are light-headed or dizzy, or you faint. ?Your peak flow reading is less than 50% of your personal best. ?You feel too tired to breathe normally. ?These symptoms may be an emergency. Get help right away. Call 911. ?Do not wait to see if the symptoms will go away. ?Do not drive yourself to the hospital. ?Summary ?Asthma is a long-term (chronic) condition that causes recurrent episodes in which the airways become tight and narrow. Asthma episodes, also called asthma attacks or asthma flares, can cause coughing, wheezing, shortness of breath, and chest pain. ?Asthma cannot be cured, but medicines and lifestyle changes can help keep it well controlled and prevent asthma flares. ?Make sure you understand how to avoid triggers and how and when to use your medicines. ?Asthma attacks can range from minor to life-threatening. Get help right away if you have an asthma attack and do not respond to treatment with your usual rescue medicines. ?This information is not intended to replace  advice given to you by your health care provider. Make sure you discuss any questions you have with your health care provider. ?Document Revised: 05/21/2021 Document Reviewed: 05/12/2021 ?Elsevier Patient Education ? 2023 [employer] ? ?

## 2021-12-15 LAB — HEPATITIS C ANTIBODY
Hepatitis C Ab: NONREACTIVE
SIGNAL TO CUT-OFF: 0.09 (ref <1.00)

## 2021-12-17 ENCOUNTER — Other Ambulatory Visit: Payer: Self-pay

## 2021-12-17 ENCOUNTER — Other Ambulatory Visit (HOSPITAL_COMMUNITY): Payer: Self-pay

## 2021-12-17 ENCOUNTER — Other Ambulatory Visit: Payer: 59

## 2021-12-17 DIAGNOSIS — E1165 Type 2 diabetes mellitus with hyperglycemia: Secondary | ICD-10-CM

## 2021-12-17 MED ORDER — LEVOTHYROXINE SODIUM 137 MCG PO TABS
137.0000 ug | ORAL_TABLET | Freq: Every day | ORAL | 2 refills | Status: DC
  Filled 2021-12-17: qty 30, 30d supply, fill #0
  Filled 2022-01-19: qty 60, 60d supply, fill #1

## 2021-12-18 LAB — HEMOGLOBIN A1C: Hgb A1c MFr Bld: 7.1 % — ABNORMAL HIGH (ref 4.6–6.5)

## 2021-12-22 ENCOUNTER — Other Ambulatory Visit (HOSPITAL_COMMUNITY): Payer: Self-pay

## 2021-12-22 MED ORDER — VENLAFAXINE HCL ER 75 MG PO CP24
ORAL_CAPSULE | ORAL | 0 refills | Status: DC
  Filled 2021-12-22: qty 90, 90d supply, fill #0

## 2021-12-23 ENCOUNTER — Other Ambulatory Visit (HOSPITAL_COMMUNITY): Payer: Self-pay

## 2021-12-29 ENCOUNTER — Other Ambulatory Visit (HOSPITAL_COMMUNITY): Payer: Self-pay

## 2022-01-19 ENCOUNTER — Other Ambulatory Visit (HOSPITAL_COMMUNITY): Payer: Self-pay

## 2022-01-28 ENCOUNTER — Encounter (HOSPITAL_BASED_OUTPATIENT_CLINIC_OR_DEPARTMENT_OTHER): Payer: Self-pay | Admitting: Orthopedic Surgery

## 2022-01-28 ENCOUNTER — Other Ambulatory Visit: Payer: Self-pay

## 2022-02-02 ENCOUNTER — Encounter (HOSPITAL_BASED_OUTPATIENT_CLINIC_OR_DEPARTMENT_OTHER)
Admission: RE | Admit: 2022-02-02 | Discharge: 2022-02-02 | Disposition: A | Discharge status: Home / Self Care | Payer: 59 | Source: Ambulatory Visit | Attending: Orthopedic Surgery | Admitting: Orthopedic Surgery

## 2022-02-02 DIAGNOSIS — Z01812 Encounter for preprocedural laboratory examination: Secondary | ICD-10-CM | POA: Diagnosis not present

## 2022-02-02 LAB — BASIC METABOLIC PANEL
Anion gap: 8 (ref 5–15)
BUN: 12 mg/dL (ref 8–23)
CO2: 26 mmol/L (ref 22–32)
Calcium: 9.5 mg/dL (ref 8.9–10.3)
Chloride: 108 mmol/L (ref 98–111)
Creatinine, Ser: 0.86 mg/dL (ref 0.44–1.00)
GFR, Estimated: >60 mL/min (ref >60)
Glucose, Bld: 198 mg/dL — ABNORMAL HIGH (ref 70–99)
Potassium: 4.5 mmol/L (ref 3.5–5.1)
Sodium: 142 mmol/L (ref 135–145)

## 2022-02-02 NOTE — Progress Notes (Signed)

## 2022-02-04 MED ORDER — HEPARIN 6000 UNIT IRRIGATION SOLUTION
Status: AC
  Filled 2022-02-04: qty 500

## 2022-02-04 NOTE — Anesthesia Preprocedure Evaluation (Signed)
Anesthesia Evaluation  Patient identified by MRN, date of birth, ID band Patient awake    Reviewed: Allergy & Precautions, H&P , NPO status , Patient's Chart, lab work & pertinent test results  Airway Mallampati: II   Neck ROM: full    Dental  (+) Teeth Intact, Dental Advisory Given   Pulmonary asthma ,    breath sounds clear to auscultation       Cardiovascular negative cardio ROS   Rhythm:regular Rate:Normal     Neuro/Psych    GI/Hepatic   Endo/Other  diabetes, Type 2Hypothyroidism   Renal/GU      Musculoskeletal   Abdominal   Peds  Hematology   Anesthesia Other Findings   Reproductive/Obstetrics                             Anesthesia Physical  Anesthesia Plan  ASA: 2  Anesthesia Plan: Bier Block and MAC and Bier Block-LIDOCAINE ONLY   Post-op Pain Management: Minimal or no pain anticipated   Induction: Intravenous  PONV Risk Score and Plan: 2 and Ondansetron, Propofol infusion, Midazolam and Treatment may vary due to age or medical condition  Airway Management Planned: Simple Face Mask, Natural Airway and Nasal Cannula  Additional Equipment: None  Intra-op Plan:   Post-operative Plan:   Informed Consent: I have reviewed the patients History and Physical, chart, labs and discussed the procedure including the risks, benefits and alternatives for the proposed anesthesia with the patient or authorized representative who has indicated his/her understanding and acceptance.       Plan Discussed with: CRNA, Anesthesiologist and Surgeon  Anesthesia Plan Comments:         Anesthesia Quick Evaluation

## 2022-02-05 ENCOUNTER — Other Ambulatory Visit: Payer: Self-pay | Admitting: Family Medicine

## 2022-02-05 ENCOUNTER — Encounter (HOSPITAL_BASED_OUTPATIENT_CLINIC_OR_DEPARTMENT_OTHER)
Admission: RE | Disposition: A | Discharge status: Home / Self Care | Payer: Self-pay | Source: Home / Self Care | Attending: Orthopedic Surgery

## 2022-02-05 ENCOUNTER — Ambulatory Visit (HOSPITAL_BASED_OUTPATIENT_CLINIC_OR_DEPARTMENT_OTHER): Payer: 59 | Admitting: Anesthesiology

## 2022-02-05 ENCOUNTER — Encounter (HOSPITAL_BASED_OUTPATIENT_CLINIC_OR_DEPARTMENT_OTHER): Payer: Self-pay | Admitting: Orthopedic Surgery

## 2022-02-05 ENCOUNTER — Ambulatory Visit (HOSPITAL_BASED_OUTPATIENT_CLINIC_OR_DEPARTMENT_OTHER)
Admission: RE | Admit: 2022-02-05 | Discharge: 2022-02-05 | Disposition: A | Discharge status: Home / Self Care | Payer: 59 | Attending: Orthopedic Surgery | Admitting: Orthopedic Surgery

## 2022-02-05 ENCOUNTER — Other Ambulatory Visit: Payer: Self-pay

## 2022-02-05 ENCOUNTER — Other Ambulatory Visit (HOSPITAL_COMMUNITY): Payer: Self-pay

## 2022-02-05 DIAGNOSIS — J45909 Unspecified asthma, uncomplicated: Secondary | ICD-10-CM

## 2022-02-05 DIAGNOSIS — G5602 Carpal tunnel syndrome, left upper limb: Secondary | ICD-10-CM | POA: Insufficient documentation

## 2022-02-05 DIAGNOSIS — Z794 Long term (current) use of insulin: Secondary | ICD-10-CM

## 2022-02-05 DIAGNOSIS — E039 Hypothyroidism, unspecified: Secondary | ICD-10-CM | POA: Insufficient documentation

## 2022-02-05 DIAGNOSIS — E109 Type 1 diabetes mellitus without complications: Secondary | ICD-10-CM | POA: Insufficient documentation

## 2022-02-05 DIAGNOSIS — Z9641 Presence of insulin pump (external) (internal): Secondary | ICD-10-CM | POA: Diagnosis not present

## 2022-02-05 DIAGNOSIS — E119 Type 2 diabetes mellitus without complications: Secondary | ICD-10-CM

## 2022-02-05 DIAGNOSIS — J4520 Mild intermittent asthma, uncomplicated: Secondary | ICD-10-CM

## 2022-02-05 HISTORY — PX: CARPAL TUNNEL RELEASE: SHX101

## 2022-02-05 LAB — GLUCOSE, CAPILLARY
Glucose-Capillary: 157 mg/dL — ABNORMAL HIGH (ref 70–99)
Glucose-Capillary: 163 mg/dL — ABNORMAL HIGH (ref 70–99)

## 2022-02-05 SURGERY — CARPAL TUNNEL RELEASE
Anesthesia: Monitor Anesthesia Care | Laterality: Left

## 2022-02-05 MED ORDER — 0.9 % SODIUM CHLORIDE (POUR BTL) OPTIME
TOPICAL | Status: DC | PRN
  Administered 2022-02-05: 200 mL

## 2022-02-05 MED ORDER — LIDOCAINE HCL (PF) 1 % IJ SOLN
INTRAMUSCULAR | Status: AC
  Filled 2022-02-05: qty 120

## 2022-02-05 MED ORDER — OXYCODONE HCL 5 MG PO TABS: 5.0000 mg | ORAL_TABLET | Freq: Once | ORAL | Status: DC | PRN

## 2022-02-05 MED ORDER — PROPOFOL 500 MG/50ML IV EMUL
INTRAVENOUS | Status: AC
  Filled 2022-02-05: qty 50

## 2022-02-05 MED ORDER — LIDOCAINE HCL (PF) 1 % IJ SOLN
INTRAMUSCULAR | Status: AC
  Filled 2022-02-05: qty 10

## 2022-02-05 MED ORDER — ARNUITY ELLIPTA 50 MCG/ACT IN AEPB
1.0000 | INHALATION_SPRAY | Freq: Every day | RESPIRATORY_TRACT | 1 refills | Status: DC
  Filled 2022-02-05: qty 30, 30d supply, fill #0
  Filled 2022-05-14: qty 30, 30d supply, fill #1

## 2022-02-05 MED ORDER — PROPOFOL 10 MG/ML IV BOLUS
INTRAVENOUS | Status: AC
  Filled 2022-02-05: qty 20

## 2022-02-05 MED ORDER — LACTATED RINGERS IV SOLN: INTRAVENOUS | Status: DC

## 2022-02-05 MED ORDER — ACETAMINOPHEN 325 MG PO TABS: 325.0000 mg | ORAL_TABLET | ORAL | Status: DC | PRN

## 2022-02-05 MED ORDER — ONDANSETRON HCL 4 MG/2ML IJ SOLN: 4.0000 mg | Freq: Once | INTRAMUSCULAR | Status: DC | PRN

## 2022-02-05 MED ORDER — FENTANYL CITRATE (PF) 100 MCG/2ML IJ SOLN: 25.0000 ug | INTRAMUSCULAR | Status: DC | PRN

## 2022-02-05 MED ORDER — CEFAZOLIN SODIUM-DEXTROSE 2-4 GM/100ML-% IV SOLN
INTRAVENOUS | Status: AC
  Filled 2022-02-05: qty 100

## 2022-02-05 MED ORDER — PROPOFOL 500 MG/50ML IV EMUL
INTRAVENOUS | Status: DC | PRN
  Administered 2022-02-05: 50 ug/kg/min via INTRAVENOUS

## 2022-02-05 MED ORDER — OXYCODONE HCL 5 MG/5ML PO SOLN: 5.0000 mg | Freq: Once | ORAL | Status: DC | PRN

## 2022-02-05 MED ORDER — CEFAZOLIN SODIUM-DEXTROSE 2-4 GM/100ML-% IV SOLN
2.0000 g | INTRAVENOUS | Status: AC
  Administered 2022-02-05: 2 g via INTRAVENOUS

## 2022-02-05 MED ORDER — MEPERIDINE HCL 25 MG/ML IJ SOLN: 6.2500 mg | INTRAMUSCULAR | Status: DC | PRN

## 2022-02-05 MED ORDER — LACTATED RINGERS IV BOLUS
500.0000 mL | Freq: Once | INTRAVENOUS | Status: AC
  Administered 2022-02-05: 500 mL via INTRAVENOUS

## 2022-02-05 MED ORDER — MIDAZOLAM HCL 5 MG/5ML IJ SOLN
INTRAMUSCULAR | Status: DC | PRN
  Administered 2022-02-05: 2 mg via INTRAVENOUS

## 2022-02-05 MED ORDER — BUPIVACAINE HCL (PF) 0.25 % IJ SOLN
INTRAMUSCULAR | Status: AC
  Filled 2022-02-05: qty 120

## 2022-02-05 MED ORDER — FENTANYL CITRATE (PF) 100 MCG/2ML IJ SOLN
INTRAMUSCULAR | Status: AC
  Filled 2022-02-05: qty 2

## 2022-02-05 MED ORDER — HYDROCODONE-ACETAMINOPHEN 5-325 MG PO TABS
ORAL_TABLET | ORAL | 0 refills | Status: DC
  Filled 2022-02-05: qty 15, 2d supply, fill #0

## 2022-02-05 MED ORDER — MIDAZOLAM HCL 2 MG/2ML IJ SOLN
INTRAMUSCULAR | Status: AC
  Filled 2022-02-05: qty 2

## 2022-02-05 MED ORDER — ONDANSETRON HCL 4 MG/2ML IJ SOLN
INTRAMUSCULAR | Status: AC
  Filled 2022-02-05: qty 2

## 2022-02-05 MED ORDER — ACETAMINOPHEN 160 MG/5ML PO SOLN: 325.0000 mg | ORAL | Status: DC | PRN

## 2022-02-05 MED ORDER — FENTANYL CITRATE (PF) 100 MCG/2ML IJ SOLN
INTRAMUSCULAR | Status: DC | PRN
  Administered 2022-02-05: 50 ug via INTRAVENOUS

## 2022-02-05 MED ORDER — ONDANSETRON HCL 4 MG/2ML IJ SOLN
INTRAMUSCULAR | Status: DC | PRN
  Administered 2022-02-05: 4 mg via INTRAVENOUS

## 2022-02-05 SURGICAL SUPPLY — 37 items
APL PRP STRL LF DISP 70% ISPRP (MISCELLANEOUS) ×1
BLADE SURG 15 STRL LF DISP TIS (BLADE) ×4 IMPLANT
BLADE SURG 15 STRL SS (BLADE) ×4
BNDG CMPR 9X4 STRL LF SNTH (GAUZE/BANDAGES/DRESSINGS)
BNDG ELASTIC 3X5.8 VLCR STR LF (GAUZE/BANDAGES/DRESSINGS) ×3 IMPLANT
BNDG ESMARK 4X9 LF (GAUZE/BANDAGES/DRESSINGS) IMPLANT
BNDG GAUZE DERMACEA FLUFF (GAUZE/BANDAGES/DRESSINGS)
BNDG GAUZE DERMACEA FLUFF 4 (GAUZE/BANDAGES/DRESSINGS) ×2 IMPLANT
BNDG GZE DERMACEA 4 6PLY (GAUZE/BANDAGES/DRESSINGS)
CHLORAPREP W/TINT 26 (MISCELLANEOUS) ×3 IMPLANT
CORD BIPOLAR FORCEPS 12FT (ELECTRODE) ×3 IMPLANT
COVER BACK TABLE 60X90IN (DRAPES) ×3 IMPLANT
COVER MAYO STAND STRL (DRAPES) ×3 IMPLANT
CUFF TOURN SGL QUICK 18X4 (TOURNIQUET CUFF) ×3 IMPLANT
DRAPE EXTREMITY T 121X128X90 (DISPOSABLE) ×3 IMPLANT
DRAPE SURG 17X23 STRL (DRAPES) ×3 IMPLANT
DRSG PAD ABDOMINAL 8X10 ST (GAUZE/BANDAGES/DRESSINGS) ×3 IMPLANT
GAUZE SPONGE 4X4 12PLY STRL (GAUZE/BANDAGES/DRESSINGS) ×3 IMPLANT
GAUZE XEROFORM 1X8 LF (GAUZE/BANDAGES/DRESSINGS) ×3 IMPLANT
GLOVE BIO SURGEON STRL SZ7.5 (GLOVE) ×3 IMPLANT
GLOVE BIOGEL PI IND STRL 8 (GLOVE) ×2 IMPLANT
GLOVE BIOGEL PI INDICATOR 8 (GLOVE) ×1
GOWN STRL REUS W/ TWL LRG LVL3 (GOWN DISPOSABLE) ×2 IMPLANT
GOWN STRL REUS W/TWL LRG LVL3 (GOWN DISPOSABLE) ×2
GOWN STRL REUS W/TWL XL LVL3 (GOWN DISPOSABLE) ×3 IMPLANT
NDL HYPO 25X1 1.5 SAFETY (NEEDLE) ×2 IMPLANT
NEEDLE HYPO 25X1 1.5 SAFETY (NEEDLE) ×2 IMPLANT
NS IRRIG 1000ML POUR BTL (IV SOLUTION) ×3 IMPLANT
PACK BASIN DAY SURGERY FS (CUSTOM PROCEDURE TRAY) ×3 IMPLANT
PADDING CAST ABS 4INX4YD NS (CAST SUPPLIES) ×1
PADDING CAST ABS COTTON 4X4 ST (CAST SUPPLIES) ×2 IMPLANT
STOCKINETTE 4X48 STRL (DRAPES) ×3 IMPLANT
SUT ETHILON 4 0 PS 2 18 (SUTURE) ×3 IMPLANT
SYR BULB EAR ULCER 3OZ GRN STR (SYRINGE) ×2 IMPLANT
SYR CONTROL 10ML LL (SYRINGE) ×3 IMPLANT
TOWEL GREEN STERILE FF (TOWEL DISPOSABLE) ×6 IMPLANT
UNDERPAD 30X36 HEAVY ABSORB (UNDERPADS AND DIAPERS) ×3 IMPLANT

## 2022-02-05 NOTE — Transfer of Care (Signed)
Immediate Anesthesia Transfer of Care Note  Patient: Tiffany Browning  Procedure(s) Performed: CARPAL TUNNEL RELEASE (Left)  Patient Location: PACU  Anesthesia Type:MAC  Level of Consciousness: awake, alert  and oriented  Airway & Oxygen Therapy: Patient Spontanous Breathing  Post-op Assessment: Report given to RN and Post -op Vital signs reviewed and stable  Post vital signs: Reviewed and stable  Last Vitals:  Vitals Value Taken Time  BP    Temp    Pulse 61 02/05/22 0927  Resp 17 02/05/22 0927  SpO2 93 % 02/05/22 0927  Vitals shown include unvalidated device data.  Last Pain:  Vitals:   02/05/22 0721  TempSrc: Oral  PainSc: 0-No pain         Complications: No notable events documented.

## 2022-02-05 NOTE — Discharge Instructions (Addendum)

## 2022-02-05 NOTE — Anesthesia Procedure Notes (Signed)
Anesthesia Regional Block: Bier block (IV Regional)   Pre-Anesthetic Checklist: , timeout performed,  Correct Patient, Correct Site, Correct Laterality,  Correct Procedure, Correct Position, site marked,  Risks and benefits discussed,  Surgical consent,  Pre-op evaluation,  At surgeon's request and post-op pain management  Laterality: Left  Prep: alcohol swabs        Procedures:,,,,, intact distal pulses, Esmarch exsanguination,  Single tourniquet utilized,  #20gu IV placed    Narrative:   Performed by: Personally  CRNA: Verita Lamb, CRNA

## 2022-02-05 NOTE — Telephone Encounter (Signed)
Qvar not covered- preferred alternatives: ARNUITY ELLIPTA AND FLOVENT DIS  KUS/FLOVENT HFA

## 2022-02-05 NOTE — Anesthesia Postprocedure Evaluation (Signed)
Anesthesia Post Note  Patient: Tiffany Browning  Procedure(s) Performed: CARPAL TUNNEL RELEASE (Left)     Anesthesia Type: MAC Anesthetic complications: no   No notable events documented.  Last Vitals:  Vitals:   02/05/22 0739 02/05/22 0926  BP: (!) 114/51 (!) 159/63  Pulse:  61  Resp:  17  Temp:    SpO2:  93%    Last Pain:  Vitals:   02/05/22 0721  TempSrc: Oral  PainSc: 0-No pain                 Albert Devaul

## 2022-02-05 NOTE — H&P (Signed)
Tiffany Browning is an 67 y.o. female.   Chief Complaint: carpal tunnel syndrome HPI: 66 yo female with numbness and tingling left hand.  Nocturnal symptoms.  Ultrasound indicates enlargement of nerve.  Positive transcarpal comparison on nerve conduction.  She wishes to proceed with left carpal tunnel release.  Allergies: No Known Allergies  Past Medical History:  Diagnosis Date   Allergy    Asthma    under control   Breast cancer ([place])    Diabetes mellitus    Type 1-on insulin pump   Family history of breast cancer    Family history of kidney cancer    Heart murmur    Hypothyroidism    Personal history of radiation therapy    Thyroid disease    Hypothyroidism    Past Surgical History:  Procedure Laterality Date   ABDOMINAL HYSTERECTOMY     APPENDECTOMY     BREAST BIOPSY Right 02/14/2016   BREAST BIOPSY Right 01/09/2016   BREAST LUMPECTOMY Right 03/26/2016   BREAST LUMPECTOMY WITH AXILLARY LYMPH NODE BIOPSY Right 03/26/2016   Procedure: RIGHT BREAST LUMPECTOMY WITH AXILLARY LYMPH NODE BIOPSY;  Surgeon: Autumn Messing III, MD;  Location: [facility];  Service: General;  Laterality: Right;   CARPAL TUNNEL RELEASE Right 02/09/2019   Procedure: RIGHT CARPAL TUNNEL RELEASE;  Surgeon: Daryll Brod, MD;  Location: [facility];  Service: Orthopedics;  Laterality: Right;   NASAL SINUS SURGERY     ruptured ovarian cyst     SHOULDER ARTHROSCOPY  2004   b/L --second one 2011   TONSILECTOMY, ADENOIDECTOMY, BILATERAL MYRINGOTOMY AND TUBES     TRIGGER FINGER RELEASE Right 07/04/2020   Procedure: RELEASE TRIGGER FINGER/A-1 PULLEY;  Surgeon: Daryll Brod, MD;  Location: [facility];  Service: Orthopedics;  Laterality: Right;  IV REGIONAL FOREARM BLOCK   TUBAL LIGATION     then reversed it    Family History: Family History  Problem Relation Age of Onset   Cancer Mother        cervical   Hypertension Mother    Hyperlipidemia Mother    Heart disease Father 2        MI---- cabg before that   Hypothyroidism Sister    Breast cancer Sister 67   Heart disease Brother 36       MI--- second one 3   Hyperlipidemia Brother    Hypertension Brother    Hypothyroidism Sister    Kidney cancer Other 21       son of her sister with breast cancer    Social History:   reports that she has never smoked. She has never used smokeless tobacco. She reports current alcohol use. She reports that she does not use drugs.  Medications: Medications Prior to Admission  Medication Sig Dispense Refill   Alpha-Lipoic Acid 300 MG CAPS Take by mouth daily.     beclomethasone (QVAR REDIHALER) 40 MCG/ACT inhaler Inhale 2 puffs into the lungs 2 (two) times daily. 10.6 g 5   Biotin 10000 MCG TABS Take 1 tablet by mouth daily.     Calcium Carb-Cholecalciferol (CALTRATE 600+D3) 600-800 MG-UNIT TABS 1x a day     Calcium-Magnesium-Vitamin D (CALCIUM MAGNESIUM PO) Take 1 tablet by mouth daily.     Cinnamon 500 MG capsule Take 500 mg by mouth daily.     fish oil-omega-3 fatty acids 1000 MG capsule Take 1 g by mouth daily.     Insulin Lispro-aabc (LYUMJEV) 100 UNIT/ML SOLN Inject up to 35  Units as directed with insulin pump 40 mL 3   levothyroxine (SYNTHROID) 137 MCG tablet Take 1 tablet (137 mcg total) by mouth daily before breakfast. 30 tablet 2   montelukast (SINGULAIR) 10 MG tablet Take 1 tablet (10 mg total) by mouth at bedtime. 90 tablet 3   Multiple Vitamin (MULTIVITAMIN) tablet Take 1 tablet by mouth daily.     rosuvastatin (CRESTOR) 5 MG tablet Take 1 tablet (5 mg total) by mouth daily. 90 tablet 3   venlafaxine XR (EFFEXOR-XR) 75 MG 24 hr capsule Take 1 capsule (75 mg total) by mouth daily with food 90 capsule 0   albuterol (VENTOLIN HFA) 108 (90 Base) MCG/ACT inhaler Inhale 2 puffs into the lungs every 6 hours as needed for wheezing or shortness of breath. 6.7 g 2   glucose blood (FREESTYLE LITE) test strip Check blood sugar 6 times a day 600 each 3   influenza vac split  quadrivalent PF (AFLURIA QUADRIVALENT) 0.5 ML injection Afluria Qd 2019-20 (36 mos up)(PF)60 mcg (15 mcg x4)/0.5 mL IM syringe  TO BE ADMINISTERED BY PHARMACIST FOR IMMUNIZATION     neomycin-polymyxin-hydrocortisone (CORTISPORIN) 3.5-10000-1 OTIC suspension 3 drops 3 (three) times daily.      Results for orders placed or performed during the hospital encounter of 02/05/22 (from the past 48 hour(s))  Glucose, capillary     Status: Abnormal   Collection Time: 02/05/22  7:31 AM  Result Value Ref Range   Glucose-Capillary 157 (H) 70 - 99 mg/dL    Comment: Glucose reference range applies only to samples taken after fasting for at least 8 hours.    No results found.    Blood pressure (!) 114/51, pulse (!) 55, temperature 97.8 F (36.6 C), temperature source Oral, resp. rate 18, height '5\' 1"'$  (1.549 m), weight 60.1 kg, SpO2 98 %.  General appearance: alert, cooperative, and appears stated age Head: Normocephalic, without obvious abnormality, atraumatic Neck: supple, symmetrical, trachea midline Extremities: Intact sensation and capillary refill all digits.  +epl/fpl/io.  No wounds.  Pulses: 2+ and symmetric Skin: Skin color, texture, turgor normal. No rashes or lesions Neurologic: Grossly normal Incision/Wound: none  Assessment/Plan Left carpal tunnel syndrome.  Non operative and operative treatment options have been discussed with the patient and patient wishes to proceed with operative treatment. Risks, benefits and alternatives of surgery were discussed including risks of blood loss, infection, damage to nerves/vessels/tendons/ligament/bone, failure of surgery, need for additional surgery, complication with wound healing, stiffness, recurrence, damage to motor branch.  She voiced understanding of these risks and elected to proceed.    Tiffany Browning 02/05/2022, 8:38 AM

## 2022-02-05 NOTE — Op Note (Signed)
02/05/2022 [facility]                              OPERATIVE REPORT   PREOPERATIVE DIAGNOSIS:  Left carpal tunnel syndrome.  POSTOPERATIVE DIAGNOSIS:  Left carpal tunnel syndrome.  PROCEDURE:  Left carpal tunnel release.  SURGEON:  Leanora Cover, MD  ASSISTANT:  none.  ANESTHESIA: Bier block with sedation  IV FLUIDS:  Per anesthesia flow sheet.  ESTIMATED BLOOD LOSS:  Minimal.  COMPLICATIONS:  None.  SPECIMENS:  None.  TOURNIQUET TIME:    Total Tourniquet Time Documented: Forearm (Left) - 25 minutes Total: Forearm (Left) - 25 minutes   DISPOSITION:  Stable to PACU.  LOCATION: [facility]  INDICATIONS:  66 yo female with numbness and tingling left hand.  Nocturnal symptoms.  Ultrasound shows swollen median nerve.  Positive transcarpal comparisons on NCV.  She wishes to have a carpal tunnel release for management of her symptoms.  Risks, benefits and alternatives of surgery were discussed including the risk of blood loss; infection; damage to nerves, vessels, tendons, ligaments, bone; failure of surgery; need for additional surgery; complications with wound healing; continued pain; recurrence of carpal tunnel syndrome; and damage to motor branch. She voiced understanding of these risks and elected to proceed.   OPERATIVE COURSE:  After being identified preoperatively by myself, the patient and I agreed upon the procedure and site of procedure.  The surgical site was marked.  Surgical consent had been signed.  She was given IV Ancef as preoperative antibiotic prophylaxis.  She was transferred to the operating room and placed on the operating room table in supine position with the Left upper extremity on an armboard.  Bier block anesthesia was induced by the anesthesiologist.  Left upper extremity was prepped and draped in normal sterile orthopaedic fashion.  A surgical pause was performed between the surgeons, anesthesia, and operating room staff, and all  were in agreement as to the patient, procedure, and site of procedure.  Tourniquet at the proximal aspect of the forearm had been inflated for the Bier block  Incision was made over the transverse carpal ligament and carried into the subcutaneous tissues by spreading technique.  Bipolar electrocautery was used to obtain hemostasis.  The palmar fascia was sharply incised.  The transverse carpal ligament was identified and sharply incised.  It was incised distally first.  The flexor tendons were identified.  The flexor tendon to the little finger was identified and retracted radially.  The transverse carpal ligament was then incised proximally.  Scissors were used to split the distal aspect of the volar antebrachial fascia.  A finger was placed into the wound to ensure complete decompression, which was the case.  The nerve was examined.  It was adherent to the radial leaflet.  The motor branch was identified and was intact.  The wound was copiously irrigated with sterile saline.  It was then closed with 4-0 nylon in a horizontal mattress fashion.  It was injected with 0.25% plain Marcaine to aid in postoperative analgesia.  It was dressed with sterile Xeroform, 4x4s, an ABD, and wrapped with Kerlix and an Ace bandage.  Tourniquet was deflated at 25 minutes.  Fingertips were pink with brisk capillary refill after deflation of the tourniquet.  Operative drapes were broken down.  The patient was awoken from anesthesia safely.  She was transferred back to stretcher and taken to the PACU in stable condition.  I  will see her back in the office in 1 week for postoperative followup.  I will give her a prescription for Norco 5/325 1-2 tabs PO q6 hours prn pain, dispense # 15.    Leanora Cover, MD Electronically signed, 02/05/22

## 2022-02-06 ENCOUNTER — Other Ambulatory Visit (HOSPITAL_COMMUNITY): Payer: Self-pay

## 2022-02-06 ENCOUNTER — Encounter (HOSPITAL_BASED_OUTPATIENT_CLINIC_OR_DEPARTMENT_OTHER): Payer: Self-pay | Admitting: Orthopedic Surgery

## 2022-02-09 ENCOUNTER — Other Ambulatory Visit (HOSPITAL_COMMUNITY): Payer: Self-pay

## 2022-02-09 IMAGING — CT CT CARDIAC CORONARY ARTERY CALCIUM SCORE
3 series · 14 of 20 positions shown, 16 images · non-contrast
Comparison: None.

CLINICAL DATA: 65-year-old Caucasian female with treated
hyperlipidemia and family history of coronary artery disease.
Personal history of RIGHT breast cancer.

EXAM:
CT CARDIAC CORONARY ARTERY CALCIUM SCORE
TECHNIQUE: Non-contrast imaging through the heart was performed using
prospective ECG gating. Image post processing was performed on an
independent workstation, allowing for quantitative analysis of the
heart and coronary arteries. Note that this exam targets the heart
and the chest was not imaged in its entirety.

[Series 2: calcium scoring 2.00 qr36 bestdiast 68% hrt calciu · axial · 0.33mm/px · z∈[+1657,+1753]mm · 4 of 80 slices shown]
[im 16/80  vessel]
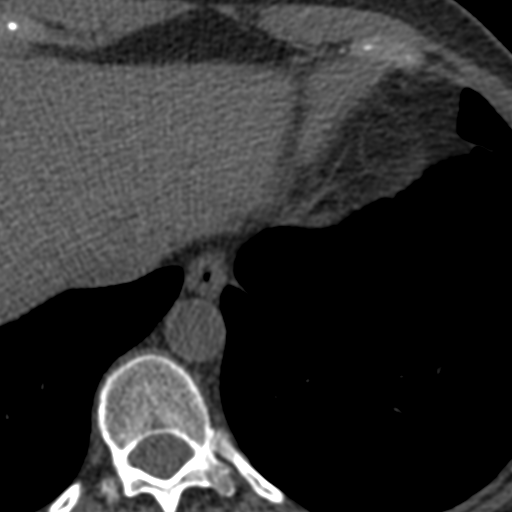
[im 32/80  vessel]
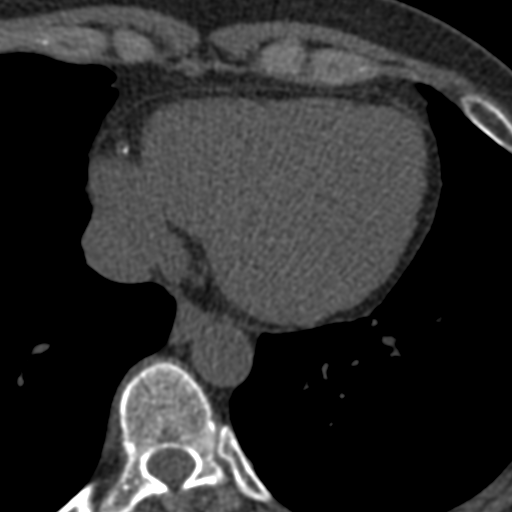
[im 48/80  vessel]
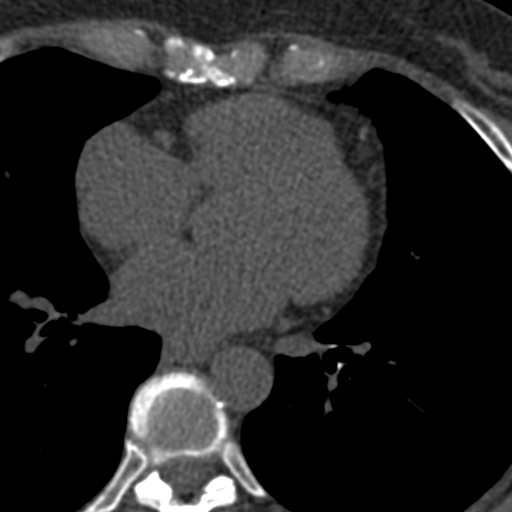
[im 64/80  vessel]
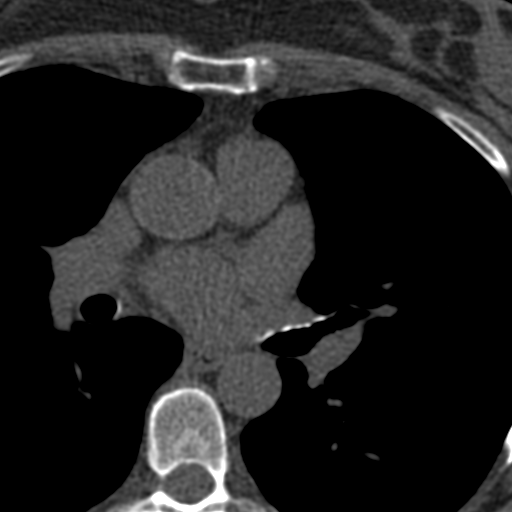

[Series 3: calcium scoring 2.00 br40 bestdiast 68% axial · axial · 0.50mm/px · z∈[+1653,+1757]mm · 5 of 80 slices shown, 7 images]
[im 14/80  vessel]
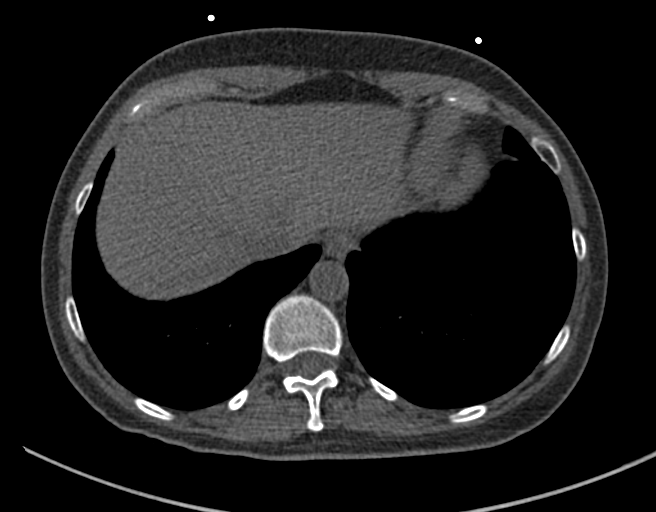
[im 14/80  lung]
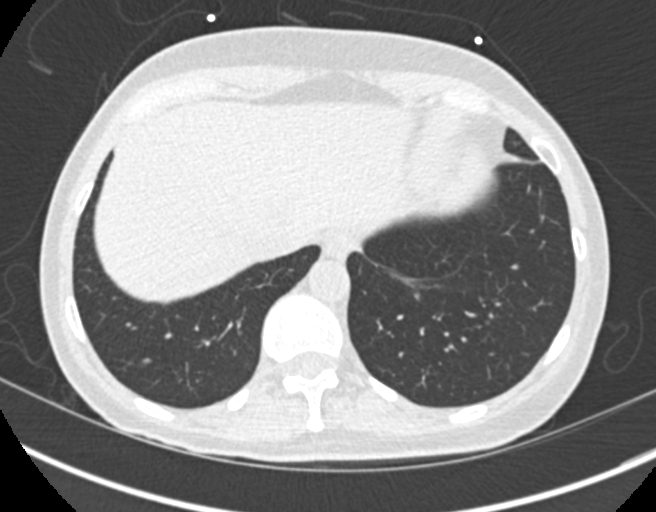
[im 27/80  vessel]
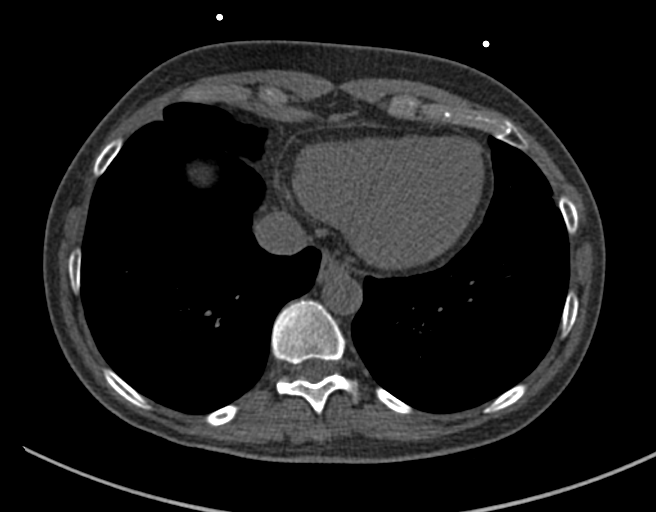
[im 40/80  vessel]
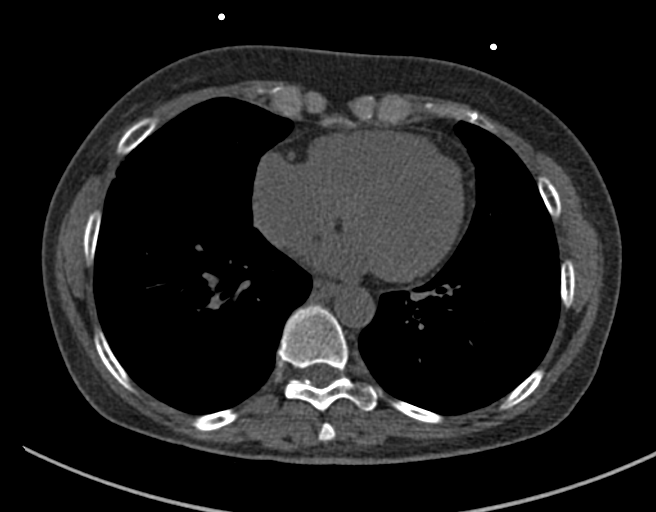
[im 53/80  vessel]
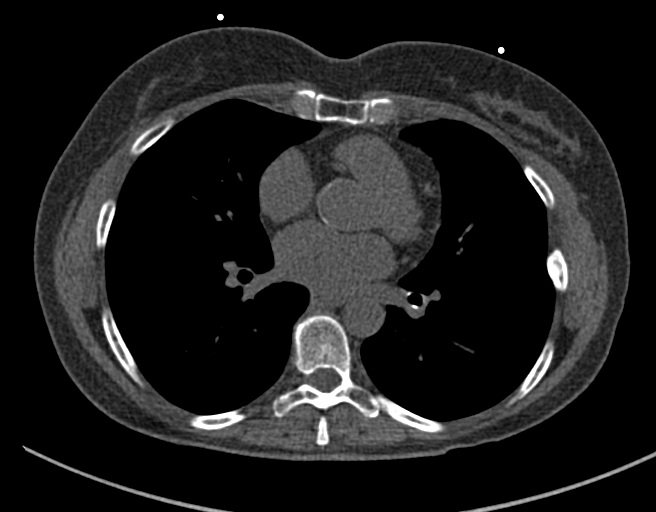
[im 66/80  vessel]
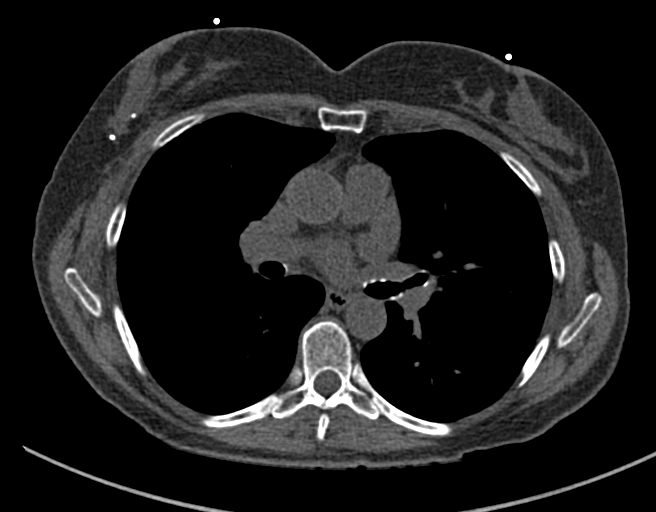
[im 66/80  lung]
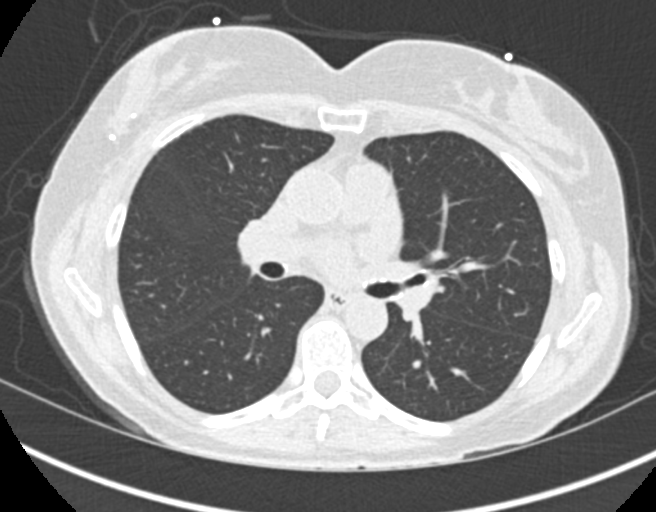

[Series 9: calcium scoring 2.00 br60 bestdiast 68% lungs · axial · 0.50mm/px · z∈[+1653,+1757]mm · 5 of 80 slices shown]
[im 14/80  vessel]
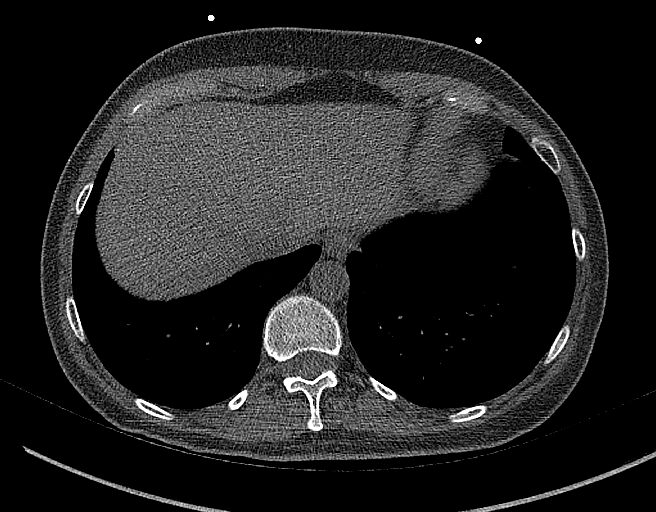
[im 27/80  vessel]
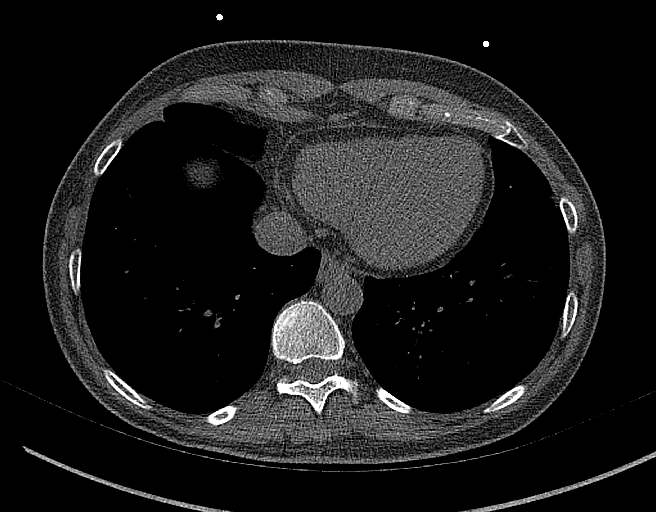
[im 40/80  vessel]
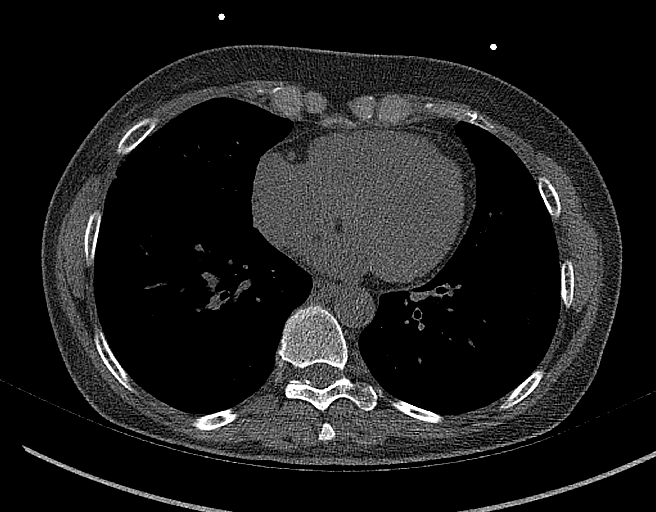
[im 53/80  vessel]
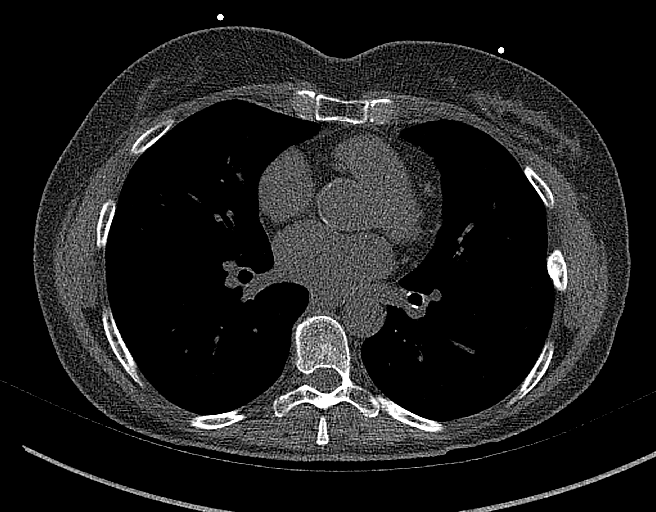
[im 66/80  vessel]
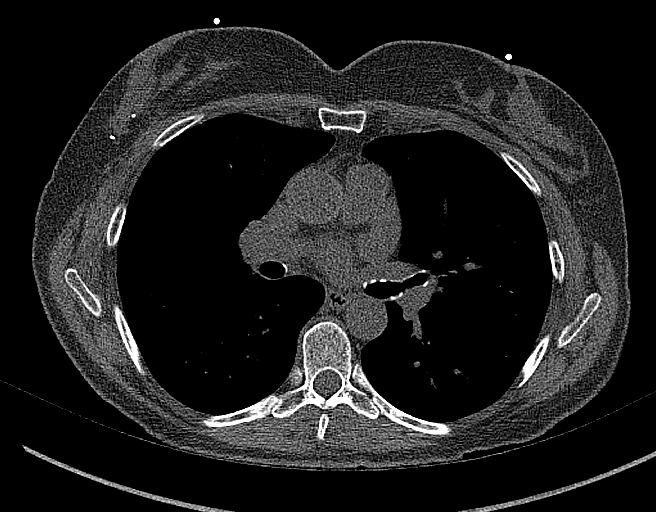

[14 of 20 positions shown; findings below may reference images not displayed]

FINDINGS: CORONARY CALCIUM SCORES:

Left Main:

LAD:

LCx:

RCA:

Total Agatston Score: 355

[HOSPITAL] percentile: 94

AORTA MEASUREMENTS:

Ascending Aorta: 30 mm

Descending Aorta: 22 mm

OTHER FINDINGS:

Scarring in the anterolateral RIGHT UPPER LOBE underlying the RIGHT
breast lumpectomy site, therefore due to the prior radiation
therapy. Visualized lung parenchyma otherwise clear. No pathologic
lymphadenopathy within the visualized hila and mediastinum.
Visualized extreme upper abdomen unremarkable for the unenhanced
technique.
IMPRESSION: 1. Total coronary artery Agatston score of 355, placing the patient
in the 94th percentile for age, gender and race.
2. No significant extracardiac findings.

## 2022-02-12 DIAGNOSIS — E1165 Type 2 diabetes mellitus with hyperglycemia: Secondary | ICD-10-CM | POA: Diagnosis not present

## 2022-02-13 DIAGNOSIS — J45909 Unspecified asthma, uncomplicated: Secondary | ICD-10-CM | POA: Diagnosis not present

## 2022-02-13 DIAGNOSIS — Z794 Long term (current) use of insulin: Secondary | ICD-10-CM | POA: Diagnosis not present

## 2022-02-13 DIAGNOSIS — Z4681 Encounter for fitting and adjustment of insulin pump: Secondary | ICD-10-CM | POA: Diagnosis not present

## 2022-02-13 DIAGNOSIS — M8580 Other specified disorders of bone density and structure, unspecified site: Secondary | ICD-10-CM | POA: Diagnosis not present

## 2022-02-13 DIAGNOSIS — E1042 Type 1 diabetes mellitus with diabetic polyneuropathy: Secondary | ICD-10-CM | POA: Diagnosis not present

## 2022-02-13 DIAGNOSIS — E039 Hypothyroidism, unspecified: Secondary | ICD-10-CM | POA: Diagnosis not present

## 2022-02-13 DIAGNOSIS — Z853 Personal history of malignant neoplasm of breast: Secondary | ICD-10-CM | POA: Diagnosis not present

## 2022-03-09 ENCOUNTER — Other Ambulatory Visit (HOSPITAL_COMMUNITY): Payer: Self-pay

## 2022-03-22 ENCOUNTER — Other Ambulatory Visit: Payer: Self-pay | Admitting: Family Medicine

## 2022-03-22 ENCOUNTER — Other Ambulatory Visit (HOSPITAL_COMMUNITY): Payer: Self-pay

## 2022-03-23 ENCOUNTER — Other Ambulatory Visit (HOSPITAL_COMMUNITY): Payer: Self-pay

## 2022-03-23 MED ORDER — LEVOTHYROXINE SODIUM 137 MCG PO TABS
137.0000 ug | ORAL_TABLET | Freq: Every day | ORAL | 0 refills | Status: DC
  Filled 2022-03-23: qty 90, 90d supply, fill #0

## 2022-03-23 MED ORDER — VENLAFAXINE HCL ER 75 MG PO CP24
ORAL_CAPSULE | ORAL | 12 refills | Status: DC
  Filled 2022-03-23: qty 90, 90d supply, fill #0

## 2022-03-23 MED ORDER — ROSUVASTATIN CALCIUM 5 MG PO TABS
5.0000 mg | ORAL_TABLET | Freq: Every day | ORAL | 3 refills | Status: DC
  Filled 2022-03-23: qty 90, 90d supply, fill #0

## 2022-03-24 ENCOUNTER — Other Ambulatory Visit (HOSPITAL_COMMUNITY): Payer: Self-pay

## 2022-03-27 ENCOUNTER — Other Ambulatory Visit (HOSPITAL_COMMUNITY): Payer: Self-pay

## 2022-03-31 ENCOUNTER — Other Ambulatory Visit (HOSPITAL_COMMUNITY): Payer: Self-pay

## 2022-04-03 ENCOUNTER — Other Ambulatory Visit (HOSPITAL_COMMUNITY): Payer: Self-pay

## 2022-04-13 ENCOUNTER — Other Ambulatory Visit: Payer: Self-pay | Admitting: Nurse Practitioner

## 2022-04-13 ENCOUNTER — Other Ambulatory Visit (HOSPITAL_COMMUNITY)
Admission: RE | Admit: 2022-04-13 | Discharge: 2022-04-13 | Disposition: A | Discharge status: Home / Self Care | Payer: 59 | Source: Ambulatory Visit | Attending: Nurse Practitioner | Admitting: Nurse Practitioner

## 2022-04-13 DIAGNOSIS — Z01419 Encounter for gynecological examination (general) (routine) without abnormal findings: Secondary | ICD-10-CM | POA: Diagnosis not present

## 2022-04-13 DIAGNOSIS — Z124 Encounter for screening for malignant neoplasm of cervix: Secondary | ICD-10-CM | POA: Insufficient documentation

## 2022-04-13 DIAGNOSIS — M85852 Other specified disorders of bone density and structure, left thigh: Secondary | ICD-10-CM | POA: Diagnosis not present

## 2022-04-13 DIAGNOSIS — Z1231 Encounter for screening mammogram for malignant neoplasm of breast: Secondary | ICD-10-CM

## 2022-04-16 LAB — CYTOLOGY - PAP
Comment: NEGATIVE
Diagnosis: NEGATIVE
High risk HPV: NEGATIVE

## 2022-05-14 ENCOUNTER — Other Ambulatory Visit (HOSPITAL_COMMUNITY): Payer: Self-pay

## 2022-05-14 ENCOUNTER — Other Ambulatory Visit: Payer: Self-pay | Admitting: Family Medicine

## 2022-05-14 ENCOUNTER — Ambulatory Visit
Admission: RE | Admit: 2022-05-14 | Discharge: 2022-05-14 | Disposition: A | Discharge status: Home / Self Care | Payer: 59 | Source: Ambulatory Visit | Attending: Nurse Practitioner | Admitting: Nurse Practitioner

## 2022-05-14 DIAGNOSIS — J4520 Mild intermittent asthma, uncomplicated: Secondary | ICD-10-CM

## 2022-05-14 DIAGNOSIS — Z1231 Encounter for screening mammogram for malignant neoplasm of breast: Secondary | ICD-10-CM | POA: Diagnosis not present

## 2022-05-14 MED ORDER — MONTELUKAST SODIUM 10 MG PO TABS
10.0000 mg | ORAL_TABLET | Freq: Every day | ORAL | 0 refills | Status: DC
  Filled 2022-05-14: qty 30, 30d supply, fill #0

## 2022-05-15 ENCOUNTER — Other Ambulatory Visit (HOSPITAL_COMMUNITY): Payer: Self-pay

## 2022-05-15 DIAGNOSIS — E1165 Type 2 diabetes mellitus with hyperglycemia: Secondary | ICD-10-CM | POA: Diagnosis not present

## 2022-05-29 ENCOUNTER — Other Ambulatory Visit: Payer: Self-pay | Admitting: *Deleted

## 2022-05-29 DIAGNOSIS — J4520 Mild intermittent asthma, uncomplicated: Secondary | ICD-10-CM

## 2022-05-29 MED ORDER — LEVOTHYROXINE SODIUM 137 MCG PO TABS: 137.0000 ug | ORAL_TABLET | Freq: Every day | ORAL | 1 refills | Status: DC

## 2022-05-29 MED ORDER — MONTELUKAST SODIUM 10 MG PO TABS: 10.0000 mg | ORAL_TABLET | Freq: Every day | ORAL | 1 refills | Status: DC

## 2022-05-29 MED ORDER — ARNUITY ELLIPTA 50 MCG/ACT IN AEPB: 1.0000 | INHALATION_SPRAY | Freq: Every day | RESPIRATORY_TRACT | 1 refills | Status: DC

## 2022-05-29 NOTE — Telephone Encounter (Signed)
Spoke with pt and she now uses [facility]

## 2022-06-03 DIAGNOSIS — E109 Type 1 diabetes mellitus without complications: Secondary | ICD-10-CM | POA: Diagnosis not present

## 2022-06-06 DIAGNOSIS — R6883 Chills (without fever): Secondary | ICD-10-CM | POA: Diagnosis not present

## 2022-06-06 DIAGNOSIS — R6889 Other general symptoms and signs: Secondary | ICD-10-CM | POA: Diagnosis not present

## 2022-06-06 DIAGNOSIS — E1165 Type 2 diabetes mellitus with hyperglycemia: Secondary | ICD-10-CM | POA: Diagnosis not present

## 2022-06-23 ENCOUNTER — Ambulatory Visit (INDEPENDENT_AMBULATORY_CARE_PROVIDER_SITE_OTHER): Payer: Medicare Other | Admitting: Family Medicine

## 2022-06-23 ENCOUNTER — Encounter: Payer: Self-pay | Admitting: Family Medicine

## 2022-06-23 VITALS — BP 108/68 | HR 67 | Temp 97.8°F | Resp 18 | Ht 61.0 in | Wt 133.4 lb

## 2022-06-23 DIAGNOSIS — E1165 Type 2 diabetes mellitus with hyperglycemia: Secondary | ICD-10-CM | POA: Diagnosis not present

## 2022-06-23 DIAGNOSIS — Z23 Encounter for immunization: Secondary | ICD-10-CM | POA: Diagnosis not present

## 2022-06-23 DIAGNOSIS — E785 Hyperlipidemia, unspecified: Secondary | ICD-10-CM

## 2022-06-23 DIAGNOSIS — Z136 Encounter for screening for cardiovascular disorders: Secondary | ICD-10-CM

## 2022-06-23 DIAGNOSIS — Z0000 Encounter for general adult medical examination without abnormal findings: Secondary | ICD-10-CM | POA: Diagnosis not present

## 2022-06-23 DIAGNOSIS — E039 Hypothyroidism, unspecified: Secondary | ICD-10-CM

## 2022-06-23 NOTE — Assessment & Plan Note (Signed)
Encourage heart healthy diet such as MIND or DASH diet, increase exercise, avoid trans fats, simple carbohydrates and processed foods, consider a krill or fish or flaxseed oil cap daily.  °

## 2022-06-23 NOTE — Progress Notes (Addendum)
Subjective:   By signing my name below, I, Shehryar Baig, attest that this documentation has been prepared under the direction and in the presence of [name], DO. 06/23/2022    Patient ID: Tiffany Browning, female    DOB: [birth year], 66 y.o.   MRN: [id number]  Chief Complaint  Patient presents with   Annual Exam    Pt states not fasting.     HPI Patient is in today for a comprehensive physical exam.and welcome to medicare.    She has no new issues with her asthma. She continues using her inhaler when her breathing worsens.  She last ate a smoothie prior to this visit and reports her blood sugar measured 94 after. She continues following up with her endocrinologist regularly.  She denies having any fever, new muscle pain, new joint pain, new moles, congestion, sinus pain, sore throat, chest pain, palpations, cough, SOB, wheezing, n/v/d, constipation, memory issues, blood in stool, dysuria, frequency, hematuria, or headaches at this time. She has no changes to her family medical history.  She reports receiving the flu vaccine on 06/02/2022. She received the new pfizer Covid-19 booster vaccine at the same time as well.  She is participating in regular exercise by going to the gym and walking 2-3 days weekly for at least 1 hour.  She is UTD on vision care. She is 20/25 -1 with both eyes with glasses.     Past Medical History:  Diagnosis Date   Allergy    Asthma    under control   Breast cancer ([place])    Diabetes mellitus    Type 1-on insulin pump   Family history of breast cancer    Family history of kidney cancer    Heart murmur    Hypothyroidism    Personal history of radiation therapy    Thyroid disease    Hypothyroidism    Past Surgical History:  Procedure Laterality Date   ABDOMINAL HYSTERECTOMY     APPENDECTOMY     BREAST BIOPSY Right 02/14/2016   BREAST BIOPSY Right 01/09/2016   BREAST LUMPECTOMY Right 03/26/2016   BREAST LUMPECTOMY WITH AXILLARY  LYMPH NODE BIOPSY Right 03/26/2016   Procedure: RIGHT BREAST LUMPECTOMY WITH AXILLARY LYMPH NODE BIOPSY;  Surgeon: Autumn Messing III, MD;  Location: [place];  Service: General;  Laterality: Right;   CARPAL TUNNEL RELEASE Right 02/09/2019   Procedure: RIGHT CARPAL TUNNEL RELEASE;  Surgeon: Daryll Brod, MD;  Location: [place];  Service: Orthopedics;  Laterality: Right;   CARPAL TUNNEL RELEASE Left 02/05/2022   Procedure: CARPAL TUNNEL RELEASE;  Surgeon: Leanora Cover, MD;  Location: [place];  Service: Orthopedics;  Laterality: Left;  30 MIN   NASAL SINUS SURGERY     ruptured ovarian cyst     SHOULDER ARTHROSCOPY  2004   b/L --second one 2011   TONSILECTOMY, ADENOIDECTOMY, BILATERAL MYRINGOTOMY AND TUBES     TRIGGER FINGER RELEASE Right 07/04/2020   Procedure: RELEASE TRIGGER FINGER/A-1 PULLEY;  Surgeon: Daryll Brod, MD;  Location: [place];  Service: Orthopedics;  Laterality: Right;  IV REGIONAL FOREARM BLOCK   TUBAL LIGATION     then reversed it    Family History  Problem Relation Age of Onset   Cancer Mother        cervical   Hypertension Mother    Hyperlipidemia Mother    Heart disease Father 46       MI---- cabg before that  Hypothyroidism Sister    Breast cancer Sister 26   Heart disease Brother 85       MI--- second one 92   Hyperlipidemia Brother    Hypertension Brother    Hypothyroidism Sister    Kidney cancer Other 70       son of her sister with breast cancer    Social History   Socioeconomic History   Marital status: Married    Spouse name: Tommy   Number of children: 0   Years of education: Not on file   Highest education level: Not on file  Occupational History   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  Tobacco Use   Smoking status: Never   Smokeless tobacco: Never  Vaping Use   Vaping Use: Never used  Substance and Sexual Activity   Alcohol use: Yes    Comment: occ   Drug use: No   Sexual activity:  Yes    Partners: Male  Other Topics Concern   Not on file  Social History Narrative   Not on file   Social Determinants of Health   Financial Resource Strain: Not on file  Food Insecurity: Not on file  Transportation Needs: Not on file  Physical Activity: Not on file  Stress: Not on file  Social Connections: Not on file  Intimate Partner Violence: Not on file    Outpatient Medications Prior to Visit  Medication Sig Dispense Refill   Alpha-Lipoic Acid 300 MG CAPS Take by mouth daily.     Biotin 10000 MCG TABS Take 1 tablet by mouth daily.     Calcium Carb-Cholecalciferol (CALTRATE 600+D3) 600-800 MG-UNIT TABS 1x a day     Calcium-Magnesium-Vitamin D (CALCIUM MAGNESIUM PO) Take 1 tablet by mouth daily.     Cinnamon 500 MG capsule Take 500 mg by mouth daily.     fish oil-omega-3 fatty acids 1000 MG capsule Take 1 g by mouth daily.     Fluticasone Furoate (ARNUITY ELLIPTA) 50 MCG/ACT AEPB Inhale 1 puff into the lungs daily. 3 each 1   glucose blood (FREESTYLE LITE) test strip Check blood sugar 6 times a day 600 each 3   HYDROcodone-acetaminophen (NORCO/VICODIN) 5-325 MG tablet Take 1-2 tablets by mouth every 6 hours as needed for pain. 15 tablet 0   Insulin Lispro-aabc (LYUMJEV) 100 UNIT/ML SOLN Inject up to 35 Units as directed with insulin pump 40 mL 3   levothyroxine (SYNTHROID) 137 MCG tablet Take 1 tablet (137 mcg total) by mouth daily before breakfast. 90 tablet 1   montelukast (SINGULAIR) 10 MG tablet Take 1 tablet (10 mg total) by mouth at bedtime. 90 tablet 1   Multiple Vitamin (MULTIVITAMIN) tablet Take 1 tablet by mouth daily.     neomycin-polymyxin-hydrocortisone (CORTISPORIN) 3.5-10000-1 OTIC suspension 3 drops 3 (three) times daily.     rosuvastatin (CRESTOR) 5 MG tablet Take 1 tablet (5 mg total) by mouth daily. 90 tablet 3   albuterol (VENTOLIN HFA) 108 (90 Base) MCG/ACT inhaler Inhale 2 puffs into the lungs every 6 hours as needed for wheezing or shortness of breath.  (Patient not taking: Reported on 06/23/2022) 6.7 g 2   influenza vac split quadrivalent PF (AFLURIA QUADRIVALENT) 0.5 ML injection Afluria Qd 2019-20 (36 mos up)(PF)60 mcg (15 mcg x4)/0.5 mL IM syringe  TO BE ADMINISTERED BY PHARMACIST FOR IMMUNIZATION (Patient not taking: Reported on 06/23/2022)     venlafaxine XR (EFFEXOR-XR) 75 MG 24 hr capsule Take 1 capsule (75 mg total) by mouth daily with food (  Patient not taking: Reported on 06/23/2022) 90 capsule 12   No facility-administered medications prior to visit.    No Known Allergies  Review of Systems  Constitutional:  Negative for chills, fever and malaise/fatigue.  HENT:  Negative for congestion, hearing loss, sinus pain and sore throat.   Eyes:  Negative for discharge.  Respiratory:  Negative for cough, sputum production, shortness of breath and wheezing.   Cardiovascular:  Negative for chest pain, palpitations and leg swelling.  Gastrointestinal:  Negative for abdominal pain, blood in stool, constipation, diarrhea, heartburn, nausea and vomiting.  Genitourinary:  Negative for dysuria, frequency, hematuria and urgency.  Musculoskeletal:  Negative for back pain, falls and myalgias.       (-)new muscle pain (-)new joint pain  Skin:  Negative for rash.       (-)New moles  Neurological:  Negative for dizziness, sensory change, loss of consciousness, weakness and headaches.  Endo/Heme/Allergies:  Negative for environmental allergies. Does not bruise/bleed easily.  Psychiatric/Behavioral:  Negative for depression and suicidal ideas. The patient is not nervous/anxious and does not have insomnia.        Objective:    Physical Exam Vitals and nursing note reviewed.  Constitutional:      General: She is not in acute distress.    Appearance: Normal appearance. She is not ill-appearing.  HENT:     Head: Normocephalic and atraumatic.     Right Ear: Tympanic membrane, ear canal and external ear normal.     Left Ear: Tympanic membrane, ear  canal and external ear normal.  Eyes:     Extraocular Movements: Extraocular movements intact.     Pupils: Pupils are equal, round, and reactive to light.  Cardiovascular:     Rate and Rhythm: Normal rate and regular rhythm.     Heart sounds: Normal heart sounds. No murmur heard.    No gallop.  Pulmonary:     Effort: Pulmonary effort is normal. No respiratory distress.     Breath sounds: Normal breath sounds. No wheezing or rales.  Abdominal:     General: Bowel sounds are normal. There is no distension.     Palpations: Abdomen is soft.     Tenderness: There is no abdominal tenderness. There is no guarding.  Skin:    General: Skin is warm and dry.  Neurological:     General: No focal deficit present.     Mental Status: She is alert and oriented to person, place, and time. Mental status is at baseline.  Psychiatric:        Judgment: Judgment normal.     BP 108/68 (BP Location: Left Arm, Patient Position: Sitting, Cuff Size: Normal)   Pulse 67   Temp 97.8 F (36.6 C) (Oral)   Resp 18   Ht _0  (1.549 m)   Wt 133 lb 6.4 oz (60.5 kg)   SpO2 97%   BMI 25.21 kg/m  Wt Readings from Last 3 Encounters:  06/23/22 133 lb 6.4 oz (60.5 kg)  02/05/22 132 lb 7.9 oz (60.1 kg)  12/12/21 135 lb 3.2 oz (61.3 kg)    Diabetic Foot Exam - Simple   No data filed    Lab Results  Component Value Date   WBC 5.2 12/12/2021   HGB 11.2 (L) 12/12/2021   HCT 33.0 (L) 12/12/2021   PLT 344.0 12/12/2021   GLUCOSE 198 (H) 02/02/2022   CHOL 163 12/12/2021   TRIG 88.0 12/12/2021   HDL 59.70 12/12/2021   LDLCALC 86 12/12/2021  ALT 29 12/12/2021   AST 33 12/12/2021   NA 142 02/02/2022   K 4.5 02/02/2022   CL 108 02/02/2022   CREATININE 0.86 02/02/2022   BUN 12 02/02/2022   CO2 26 02/02/2022   TSH 0.12 (L) 12/12/2021   HGBA1C 7.1 (H) 12/17/2021   MICROALBUR 12 06/11/2015    Lab Results  Component Value Date   TSH 0.12 (L) 12/12/2021   Lab Results  Component Value Date   WBC 5.2  12/12/2021   HGB 11.2 (L) 12/12/2021   HCT 33.0 (L) 12/12/2021   MCV 93.3 12/12/2021   PLT 344.0 12/12/2021   Lab Results  Component Value Date   NA 142 02/02/2022   K 4.5 02/02/2022   CHLORIDE 106 12/15/2016   CO2 26 02/02/2022   GLUCOSE 198 (H) 02/02/2022   BUN 12 02/02/2022   CREATININE 0.86 02/02/2022   BILITOT 0.3 12/12/2021   ALKPHOS 106 12/12/2021   AST 33 12/12/2021   ALT 29 12/12/2021   PROT 6.6 12/12/2021   ALBUMIN 4.1 12/12/2021   CALCIUM 9.5 02/02/2022   ANIONGAP 8 02/02/2022   EGFR 82 (L) 12/15/2016   GFR 88.29 12/12/2021   Lab Results  Component Value Date   CHOL 163 12/12/2021   Lab Results  Component Value Date   HDL 59.70 12/12/2021   Lab Results  Component Value Date   LDLCALC 86 12/12/2021   Lab Results  Component Value Date   TRIG 88.0 12/12/2021   Lab Results  Component Value Date   CHOLHDL 3 12/12/2021   Lab Results  Component Value Date   HGBA1C 7.1 (H) 12/17/2021   Mammogram- Last completed 05/20/2022. Results are normal. Repeat in 1 year.  Dexa- Last completed 09/05/2021. Results show she has low bone mass. Repeat in 2 years.  Pap smear- Last completed 04/13/2022. Results are normal. Repeat in 3 years.  Colonoscopy- Last completed 02/25/2008.      Assessment & Plan:   Problem List Items Addressed This Visit       Unprioritized   Preventative health care - Primary    Ghm utd Check labs  See AVS       Hypothyroidism    Per endo      Relevant Orders   CBC with Differential/Platelet   Comprehensive metabolic panel   Lipid panel   Hyperlipemia    Encourage heart healthy diet such as MIND or DASH diet, increase exercise, avoid trans fats, simple carbohydrates and processed foods, consider a krill or fish or flaxseed oil cap daily.        Relevant Orders   CBC with Differential/Platelet   Comprehensive metabolic panel   Lipid panel   Other Visit Diagnoses     Welcome to Medicare preventive visit       Relevant  Orders   EKG 12-Lead (Completed)   CBC with Differential/Platelet   Comprehensive metabolic panel   Lipid panel   Type 2 diabetes mellitus with hyperglycemia, without long-term current use of insulin ([place])       Relevant Orders   CBC with Differential/Platelet   Comprehensive metabolic panel   Lipid panel   Need for pneumococcal 20-valent conjugate vaccination       Relevant Orders   Pneumococcal conjugate vaccine 20-valent (Prevnar 20) (Completed)        No orders of the defined types were placed in this encounter.   IAnn Held, DO, personally preformed the services described in this documentation.  All medical  record entries made by the scribe were at my direction and in my presence.  I have reviewed the chart and discharge instructions (if applicable) and agree that the record reflects my personal performance and is accurate and complete. 06/23/2022   I,Shehryar Baig,acting as a scribe for [name], DO.,have documented all relevant documentation on the behalf of [name], DO,as directed by  [name], DO while in the presence of [name], DO.   [name], DO    Subjective:    Tiffany Browning is a 66 y.o. female who presents for a Welcome to Medicare exam.   Review of Systems  Review of Systems  Constitutional: Negative for activity change, appetite change and fatigue.  HENT: Negative for hearing loss, congestion, tinnitus and ear discharge.   Eyes: Negative for visual disturbance (see optho q1y -- vision corrected to 20/20 with glasses).  Respiratory: Negative for cough, chest tightness and shortness of breath.   Cardiovascular: Negative for chest pain, palpitations and leg swelling.  Gastrointestinal: Negative for abdominal pain, diarrhea, constipation and abdominal distention.  Genitourinary: Negative for urgency, frequency, decreased urine volume and difficulty urinating.  Musculoskeletal:  Negative for back pain, arthralgias and gait problem.  Skin: Negative for color change, pallor and rash.  Neurological: Negative for dizziness, light-headedness, numbness and headaches.  Hematological: Negative for adenopathy. Does not bruise/bleed easily.  Psychiatric/Behavioral: Negative for suicidal ideas, confusion, sleep disturbance, self-injury, dysphoric mood, decreased concentration and agitation.  Pt is able to read and write and can do all ADLs No risk for falling No abuse/ violence in home          Objective:    Today's Vitals   06/23/22 1337  BP: 108/68  Pulse: 67  Resp: 18  Temp: 97.8 F (36.6 C)  TempSrc: Oral  SpO2: 97%  Weight: 133 lb 6.4 oz (60.5 kg)  Height: _0  (1.549 m)  Body mass index is 25.21 kg/m.  Medications Outpatient Encounter Medications as of 06/23/2022  Medication Sig   Alpha-Lipoic Acid 300 MG CAPS Take by mouth daily.   Biotin 10000 MCG TABS Take 1 tablet by mouth daily.   Calcium Carb-Cholecalciferol (CALTRATE 600+D3) 600-800 MG-UNIT TABS 1x a day   Calcium-Magnesium-Vitamin D (CALCIUM MAGNESIUM PO) Take 1 tablet by mouth daily.   Cinnamon 500 MG capsule Take 500 mg by mouth daily.   fish oil-omega-3 fatty acids 1000 MG capsule Take 1 g by mouth daily.   Fluticasone Furoate (ARNUITY ELLIPTA) 50 MCG/ACT AEPB Inhale 1 puff into the lungs daily.   glucose blood (FREESTYLE LITE) test strip Check blood sugar 6 times a day   HYDROcodone-acetaminophen (NORCO/VICODIN) 5-325 MG tablet Take 1-2 tablets by mouth every 6 hours as needed for pain.   Insulin Lispro-aabc (LYUMJEV) 100 UNIT/ML SOLN Inject up to 35 Units as directed with insulin pump   levothyroxine (SYNTHROID) 137 MCG tablet Take 1 tablet (137 mcg total) by mouth daily before breakfast.   montelukast (SINGULAIR) 10 MG tablet Take 1 tablet (10 mg total) by mouth at bedtime.   Multiple Vitamin (MULTIVITAMIN) tablet Take 1 tablet by mouth daily.   neomycin-polymyxin-hydrocortisone  (CORTISPORIN) 3.5-10000-1 OTIC suspension 3 drops 3 (three) times daily.   rosuvastatin (CRESTOR) 5 MG tablet Take 1 tablet (5 mg total) by mouth daily.   albuterol (VENTOLIN HFA) 108 (90 Base) MCG/ACT inhaler Inhale 2 puffs into the lungs every 6 hours as needed for wheezing or shortness of  breath. (Patient not taking: Reported on 06/23/2022)   influenza vac split quadrivalent PF (AFLURIA QUADRIVALENT) 0.5 ML injection Afluria Qd 2019-20 (36 mos up)(PF)60 mcg (15 mcg x4)/0.5 mL IM syringe  TO BE ADMINISTERED BY PHARMACIST FOR IMMUNIZATION (Patient not taking: Reported on 06/23/2022)   venlafaxine XR (EFFEXOR-XR) 75 MG 24 hr capsule Take 1 capsule (75 mg total) by mouth daily with food (Patient not taking: Reported on 06/23/2022)   No facility-administered encounter medications on file as of 06/23/2022.     History: Past Medical History:  Diagnosis Date   Allergy    Asthma    under control   Breast cancer ([place])    Diabetes mellitus    Type 1-on insulin pump   Family history of breast cancer    Family history of kidney cancer    Heart murmur    Hypothyroidism    Personal history of radiation therapy    Thyroid disease    Hypothyroidism   Past Surgical History:  Procedure Laterality Date   ABDOMINAL HYSTERECTOMY     APPENDECTOMY     BREAST BIOPSY Right 02/14/2016   BREAST BIOPSY Right 01/09/2016   BREAST LUMPECTOMY Right 03/26/2016   BREAST LUMPECTOMY WITH AXILLARY LYMPH NODE BIOPSY Right 03/26/2016   Procedure: RIGHT BREAST LUMPECTOMY WITH AXILLARY LYMPH NODE BIOPSY;  Surgeon: Autumn Messing III, MD;  Location: [place];  Service: General;  Laterality: Right;   CARPAL TUNNEL RELEASE Right 02/09/2019   Procedure: RIGHT CARPAL TUNNEL RELEASE;  Surgeon: Daryll Brod, MD;  Location: [place];  Service: Orthopedics;  Laterality: Right;   CARPAL TUNNEL RELEASE Left 02/05/2022   Procedure: CARPAL TUNNEL RELEASE;  Surgeon: Leanora Cover, MD;  Location: [place];   Service: Orthopedics;  Laterality: Left;  30 MIN   NASAL SINUS SURGERY     ruptured ovarian cyst     SHOULDER ARTHROSCOPY  2004   b/L --second one 2011   TONSILECTOMY, ADENOIDECTOMY, BILATERAL MYRINGOTOMY AND TUBES     TRIGGER FINGER RELEASE Right 07/04/2020   Procedure: RELEASE TRIGGER FINGER/A-1 PULLEY;  Surgeon: Daryll Brod, MD;  Location: [place];  Service: Orthopedics;  Laterality: Right;  IV REGIONAL FOREARM BLOCK   TUBAL LIGATION     then reversed it    Family History  Problem Relation Age of Onset   Cancer Mother        cervical   Hypertension Mother    Hyperlipidemia Mother    Heart disease Father 53       MI---- cabg before that   Hypothyroidism Sister    Breast cancer Sister 28   Heart disease Brother 52       MI--- second one 57   Hyperlipidemia Brother    Hypertension Brother    Hypothyroidism Sister    Kidney cancer Other 92       son of her sister with breast cancer   Social History   Occupational History   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  Tobacco Use   Smoking status: Never   Smokeless tobacco: Never  Vaping Use   Vaping Use: Never used  Substance and Sexual Activity   Alcohol use: Yes    Comment: occ   Drug use: No   Sexual activity: Yes    Partners: Male    Tobacco Counseling Counseling given: Not Answered   Immunizations and Health Maintenance Immunization History  Administered Date(s) Administered   Fluad Quad(high Dose 65+) 06/02/2022   Influenza,inj,Quad PF,6+ Mos 04/24/2015,  04/28/2018   Influenza-Unspecified 05/23/2013   PFIZER(Purple Top)SARS-COV-2 Vaccination 07/19/2020   PNEUMOCOCCAL CONJUGATE-20 06/23/2022   PPD Test 06/02/2022, 06/13/2022   Pfizer Covid-19 Vaccine Bivalent Booster 7yr & up 06/02/2022   Tdap 11/21/2013   Health Maintenance Due  Topic Date Due   Medicare Annual Wellness (AWV)  Never done   OPHTHALMOLOGY EXAM  Never done   Zoster Vaccines- Shingrix (1 of 2) Never done    COLONOSCOPY (Pts [phone] coverage will need to be confirmed)  02/24/2018   Diabetic kidney evaluation - Urine ACR  09/26/2021   HEMOGLOBIN A1C  06/19/2022    Activities of Daily Living    06/23/2022    1:41 PM 02/05/2022    7:17 AM  In your present state of health, do you have any difficulty performing the following activities:  Hearing? 0 0  Vision? 0 0  Difficulty concentrating or making decisions? 0 0  Walking or climbing stairs? 0 0  Dressing or bathing? 0 0  Doing errands, shopping? 0     Physical Exam  see cpe(optional), or other factors deemed appropriate based on the beneficiary's medical and social history and current clinical standards.  Advanced Directives:  Pt has documents     Assessment:    This is a routine wellness examination for this patient .   Vision/Hearing screen Vision Screening   Right eye Left eye Both eyes  Without correction     With correction   20/25  Hearing Screening - Comments:: Normal whisper   Dietary issues and exercise activities discussed:  Current Exercise Habits: Structured exercise class, Type of exercise: strength training/weights;walking;calisthenics, Time (Minutes): 60, Frequency (Times/Week): 4, Weekly Exercise (Minutes/Week): 240, Intensity: Moderate, Exercise limited by: None identified  Goals---  con't diet and exercise f/u 1 year     Depression Screen    06/23/2022    1:40 PM 04/04/2021   10:27 AM 05/04/2016    8:13 AM 02/05/2016    2:04 PM  PHQ 2/9 Scores  PHQ - 2 Score 0 0 0 0     Fall Risk    06/23/2022    1:51 PM  Fall Risk   Falls in the past year? 0  Number falls in past yr: 0  Injury with Fall? 0  Risk for fall due to : No Fall Risks  Follow up Falls evaluation completed    Cognitive Function:    06/23/2022    1:57 PM  MMSE - Mini Mental State Exam  Orientation to time 5  Orientation to Place 5  Registration 3  Attention/ Calculation 5  Recall 3  Language- name 2 objects 2  Language-  repeat 1  Language- follow 3 step command 3  Language- read & follow direction 1  Write a sentence 1  Copy design 1  Total score 30        Patient Care Team: LoAnn HeldDO as PCP - General (Family Medicine) SoReynold BowenMD as Consulting Physician (Endocrinology) ToJovita KussmaulMD as Consulting Physician (General Surgery) [name], GuVirgie DadMD (Inactive) as Consulting Physician (Oncology) SqEppie GibsonMD as Attending Physician (Radiation Oncology) CaGardenia PhlegmNP as Nurse Practitioner (Hematology and Oncology)     Plan:   Cont with diet and exercise I have personally reviewed and noted the following in the patient's chart:   Medical and social history Use of alcohol, tobacco or illicit drugs  Current medications and supplements Functional ability and status Nutritional status Physical activity Advanced directives  List of other physicians Hospitalizations, surgeries, and ER visits in previous 12 months Vitals Screenings to include cognitive, depression, and falls Referrals and appointments  In addition, I have reviewed and discussed with patient certain preventive protocols, quality metrics, and best practice recommendations. A written personalized care plan for preventive services as well as general preventive health recommendations were provided to patient.     [place], DO 06/23/2022

## 2022-06-23 NOTE — Patient Instructions (Signed)
Preventive Care 65 Years and Older, Female Preventive care refers to lifestyle choices and visits with your health care provider that can promote health and wellness. Preventive care visits are also called wellness exams. What can I expect for my preventive care visit? Counseling Your health care provider may ask you questions about your: Medical history, including: Past medical problems. Family medical history. Pregnancy and menstrual history. History of falls. Current health, including: Memory and ability to understand (cognition). Emotional well-being. Home life and relationship well-being. Sexual activity and sexual health. Lifestyle, including: Alcohol, nicotine or tobacco, and drug use. Access to firearms. Diet, exercise, and sleep habits. Work and work environment. Sunscreen use. Safety issues such as seatbelt and bike helmet use. Physical exam Your health care provider will check your: Height and weight. These may be used to calculate your BMI (body mass index). BMI is a measurement that tells if you are at a healthy weight. Waist circumference. This measures the distance around your waistline. This measurement also tells if you are at a healthy weight and may help predict your risk of certain diseases, such as type 2 diabetes and high blood pressure. Heart rate and blood pressure. Body temperature. Skin for abnormal spots. What immunizations do I need?  Vaccines are usually given at various ages, according to a schedule. Your health care provider will recommend vaccines for you based on your age, medical history, and lifestyle or other factors, such as travel or where you work. What tests do I need? Screening Your health care provider may recommend screening tests for certain conditions. This may include: Lipid and cholesterol levels. Hepatitis C test. Hepatitis B test. HIV (human immunodeficiency virus) test. STI (sexually transmitted infection) testing, if you are at  risk. Lung cancer screening. Colorectal cancer screening. Diabetes screening. This is done by checking your blood sugar (glucose) after you have not eaten for a while (fasting). Mammogram. Talk with your health care provider about how often you should have regular mammograms. BRCA-related cancer screening. This may be done if you have a family history of breast, ovarian, tubal, or peritoneal cancers. Bone density scan. This is done to screen for osteoporosis. Talk with your health care provider about your test results, treatment options, and if necessary, the need for more tests. Follow these instructions at home: Eating and drinking  Eat a diet that includes fresh fruits and vegetables, whole grains, lean protein, and low-fat dairy products. Limit your intake of foods with high amounts of sugar, saturated fats, and salt. Take vitamin and mineral supplements as recommended by your health care provider. Do not drink alcohol if your health care provider tells you not to drink. If you drink alcohol: Limit how much you have to 0-1 drink a day. Know how much alcohol is in your drink. In the [place] one drink equals one 12 oz bottle of beer (355 mL), one 5 oz glass of wine (148 mL), or one 1 oz glass of hard liquor (44 mL). Lifestyle Brush your teeth every morning and night with fluoride toothpaste. Floss one time each day. Exercise for at least 30 minutes 5 or more days each week. Do not use any products that contain nicotine or tobacco. These products include cigarettes, chewing tobacco, and vaping devices, such as e-cigarettes. If you need help quitting, ask your health care provider. Do not use drugs. If you are sexually active, practice safe sex. Use a condom or other form of protection in order to prevent STIs. Take aspirin only as told by   your health care provider. Make sure that you understand how much to take and what form to take. Work with your health care provider to find out whether it  is safe and beneficial for you to take aspirin daily. Ask your health care provider if you need to take a cholesterol-lowering medicine (statin). Find healthy ways to manage stress, such as: Meditation, yoga, or listening to music. Journaling. Talking to a trusted person. Spending time with friends and family. Minimize exposure to UV radiation to reduce your risk of skin cancer. Safety Always wear your seat belt while driving or riding in a vehicle. Do not drive: If you have been drinking alcohol. Do not ride with someone who has been drinking. When you are tired or distracted. While texting. If you have been using any mind-altering substances or drugs. Wear a helmet and other protective equipment during sports activities. If you have firearms in your house, make sure you follow all gun safety procedures. What's next? Visit your health care provider once a year for an annual wellness visit. Ask your health care provider how often you should have your eyes and teeth checked. Stay up to date on all vaccines. This information is not intended to replace advice given to you by your health care provider. Make sure you discuss any questions you have with your health care provider. Document Revised: 01/29/2021 Document Reviewed: 01/29/2021 Elsevier Patient Education  2023 Elsevier Inc.  

## 2022-06-23 NOTE — Assessment & Plan Note (Signed)
Per endo °

## 2022-06-23 NOTE — Assessment & Plan Note (Signed)
Stable

## 2022-06-23 NOTE — Assessment & Plan Note (Signed)
Ghm utd Check labs  See AVS  

## 2022-06-24 LAB — COMPREHENSIVE METABOLIC PANEL
ALT: 29 U/L (ref 0–35)
AST: 35 U/L (ref 0–37)
Albumin: 4.2 g/dL (ref 3.5–5.2)
Alkaline Phosphatase: 108 U/L (ref 39–117)
BUN: 11 mg/dL (ref 6–23)
CO2: 28 mEq/L (ref 19–32)
Calcium: 9.4 mg/dL (ref 8.4–10.5)
Chloride: 104 mEq/L (ref 96–112)
Creatinine, Ser: 0.87 mg/dL (ref 0.40–1.20)
GFR: 70.09 mL/min (ref >60.00)
Glucose, Bld: 81 mg/dL (ref 70–99)
Potassium: 5.1 mEq/L (ref 3.5–5.1)
Sodium: 138 mEq/L (ref 135–145)
Total Bilirubin: 0.3 mg/dL (ref 0.2–1.2)
Total Protein: 7 g/dL (ref 6.0–8.3)

## 2022-06-24 LAB — LIPID PANEL
Cholesterol: 163 mg/dL (ref 0–200)
HDL: 57.4 mg/dL (ref >39.00)
LDL Cholesterol: 85 mg/dL (ref 0–99)
NonHDL: 105.48
Total CHOL/HDL Ratio: 3
Triglycerides: 100 mg/dL (ref 0.0–149.0)
VLDL: 20 mg/dL (ref 0.0–40.0)

## 2022-06-24 LAB — CBC WITH DIFFERENTIAL/PLATELET
Basophils Absolute: 0 10*3/uL (ref 0.0–0.1)
Basophils Relative: 0.9 % (ref 0.0–3.0)
Eosinophils Absolute: 0.5 10*3/uL (ref 0.0–0.7)
Eosinophils Relative: 9.2 % — ABNORMAL HIGH (ref 0.0–5.0)
HCT: 32.8 % — ABNORMAL LOW (ref 36.0–46.0)
Hemoglobin: 10.7 g/dL — ABNORMAL LOW (ref 12.0–15.0)
Lymphocytes Relative: 24.4 % (ref 12.0–46.0)
Lymphs Abs: 1.3 10*3/uL (ref 0.7–4.0)
MCHC: 32.7 g/dL (ref 30.0–36.0)
MCV: 93.1 fl (ref 78.0–100.0)
Monocytes Absolute: 0.5 10*3/uL (ref 0.1–1.0)
Monocytes Relative: 8.7 % (ref 3.0–12.0)
Neutro Abs: 3 10*3/uL (ref 1.4–7.7)
Neutrophils Relative %: 56.8 % (ref 43.0–77.0)
Platelets: 386 10*3/uL (ref 150.0–400.0)
RBC: 3.52 Mil/uL — ABNORMAL LOW (ref 3.87–5.11)
RDW: 13 % (ref 11.5–15.5)
WBC: 5.3 10*3/uL (ref 4.0–10.5)

## 2022-07-06 ENCOUNTER — Ambulatory Visit (INDEPENDENT_AMBULATORY_CARE_PROVIDER_SITE_OTHER): Payer: Medicare Other

## 2022-07-06 ENCOUNTER — Ambulatory Visit: Payer: 59 | Admitting: Podiatry

## 2022-07-06 DIAGNOSIS — M25572 Pain in left ankle and joints of left foot: Secondary | ICD-10-CM

## 2022-07-06 DIAGNOSIS — M76822 Posterior tibial tendinitis, left leg: Secondary | ICD-10-CM

## 2022-07-06 MED ORDER — METHYLPREDNISOLONE 4 MG PO TBPK: ORAL_TABLET | ORAL | 0 refills | Status: DC

## 2022-07-06 MED ORDER — MELOXICAM 15 MG PO TABS: 15.0000 mg | ORAL_TABLET | Freq: Every day | ORAL | 0 refills | Status: DC

## 2022-07-06 NOTE — Progress Notes (Signed)
Subjective:  Patient ID: MERYEM HAERTEL, female    DOB: 03/26/1956,  MRN: [id number]  Chief Complaint  Patient presents with   Ankle Pain    Left ankle pain-no known injuries. Medial aspect of ankle.    66 y.o. female presents with concern for left medial ankle pain.  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  She notices pain worse after her shift is over.  If she is not on her feet it does not bother her too much.  Does have a history of diabetes type I.  Has tried taking some ibuprofen which does take the edge off when she is having pain.  States that it feels like the ankle is weak and giving out at times.  Past Medical History:  Diagnosis Date   Allergy    Asthma    under control   Breast cancer ([place])    Diabetes mellitus    Type 1-on insulin pump   Family history of breast cancer    Family history of kidney cancer    Heart murmur    Hypothyroidism    Personal history of radiation therapy    Thyroid disease    Hypothyroidism    No Known Allergies  ROS: Negative except as per HPI above  Objective:  General: AAO x3, NAD  Dermatological: With inspection and palpation of the right and left lower extremities there are no open sores, no preulcerative lesions, no rash or signs of infection present. Nails are of normal length thickness and coloration.   Vascular:  Dorsalis Pedis artery and Posterior Tibial artery pedal pulses are 2/4 bilateral.  Capillary fill time < 3 sec to all digits.   Neruologic: Grossly intact via light touch bilateral. Protective threshold intact to all sites bilateral.   Musculoskeletal: Focal pain with palpation along the course of the posterior tibial tendon especially distally near its insertion and just proximal to the area as it traverses underneath the medial malleolus.  Gait: Unassisted, Nonantalgic.   No images are attached to the encounter.  Radiographs:  Date:  07/06/2022 XR left ankle weightbearing AP/Lateral/Oblique   Findings: No fracture identified.  Ankle is in normal alignment though there is noted to be prominence of the navicular tuberosity medially. Assessment:   1. Posterior tibial tendon dysfunction (PTTD) of left lower extremity   2. Acute left ankle pain      Plan:  Patient was evaluated and treated and all questions answered.  #Posterior tibial tendon dysfunction of left lower extremity -Discussed with the patient I believe she is having a flareup of posterior tibial tendinitis.  This is likely related to foot structure and being on her feet long hours at work. -I recommend methylprednisolone 4 mg steroid Dosepak for 6 days to decrease pain and inflammation. -I also recommend meloxicam 15 mg daily as needed for pain control for the next month. -Recommend the patient use a ankle stabilizer brace Tri-Lock brace at work especially which I believe will take some strain off the posterior tibial tendon.  This was dispensed at this visit. -Additionally recommend power step orthotics for the patient to decrease strain on the posterior tibial tendon, dispensed to the patient at this visit. -Patient will follow-up in 4 weeks to recheck PTTD left lower extremity.  Return in about 4 weeks (around 08/03/2022) for L ankle PTTD.          Everitt Amber, DPM [facility]

## 2022-07-07 DIAGNOSIS — Z853 Personal history of malignant neoplasm of breast: Secondary | ICD-10-CM | POA: Diagnosis not present

## 2022-07-07 DIAGNOSIS — M8580 Other specified disorders of bone density and structure, unspecified site: Secondary | ICD-10-CM | POA: Diagnosis not present

## 2022-07-07 DIAGNOSIS — D649 Anemia, unspecified: Secondary | ICD-10-CM | POA: Diagnosis not present

## 2022-07-07 DIAGNOSIS — Z794 Long term (current) use of insulin: Secondary | ICD-10-CM | POA: Diagnosis not present

## 2022-07-07 DIAGNOSIS — E1042 Type 1 diabetes mellitus with diabetic polyneuropathy: Secondary | ICD-10-CM | POA: Diagnosis not present

## 2022-07-24 ENCOUNTER — Other Ambulatory Visit (HOSPITAL_COMMUNITY): Payer: Self-pay

## 2022-07-31 ENCOUNTER — Other Ambulatory Visit: Payer: Self-pay

## 2022-08-06 ENCOUNTER — Ambulatory Visit: Payer: 59 | Admitting: Podiatry

## 2022-08-07 DIAGNOSIS — H2101 Hyphema, right eye: Secondary | ICD-10-CM | POA: Diagnosis not present

## 2022-08-07 DIAGNOSIS — H2513 Age-related nuclear cataract, bilateral: Secondary | ICD-10-CM | POA: Diagnosis not present

## 2022-08-07 DIAGNOSIS — E1042 Type 1 diabetes mellitus with diabetic polyneuropathy: Secondary | ICD-10-CM | POA: Diagnosis not present

## 2022-08-07 DIAGNOSIS — Z888 Allergy status to other drugs, medicaments and biological substances status: Secondary | ICD-10-CM | POA: Diagnosis not present

## 2022-08-08 ENCOUNTER — Other Ambulatory Visit: Payer: Self-pay | Admitting: Family Medicine

## 2022-08-08 DIAGNOSIS — J4520 Mild intermittent asthma, uncomplicated: Secondary | ICD-10-CM

## 2022-08-14 DIAGNOSIS — Z888 Allergy status to other drugs, medicaments and biological substances status: Secondary | ICD-10-CM | POA: Diagnosis not present

## 2022-08-14 DIAGNOSIS — H25813 Combined forms of age-related cataract, bilateral: Secondary | ICD-10-CM | POA: Diagnosis not present

## 2022-08-14 DIAGNOSIS — H2101 Hyphema, right eye: Secondary | ICD-10-CM | POA: Diagnosis not present

## 2022-08-14 DIAGNOSIS — H5704 Mydriasis: Secondary | ICD-10-CM | POA: Diagnosis not present

## 2022-08-15 ENCOUNTER — Other Ambulatory Visit (HOSPITAL_COMMUNITY): Payer: Self-pay

## 2022-08-16 DIAGNOSIS — E1165 Type 2 diabetes mellitus with hyperglycemia: Secondary | ICD-10-CM | POA: Diagnosis not present

## 2022-08-24 ENCOUNTER — Ambulatory Visit: Payer: 59 | Admitting: Podiatry

## 2022-09-02 DIAGNOSIS — H25813 Combined forms of age-related cataract, bilateral: Secondary | ICD-10-CM | POA: Diagnosis not present

## 2022-09-03 ENCOUNTER — Encounter: Payer: Self-pay | Admitting: Family Medicine

## 2022-09-07 DIAGNOSIS — E1042 Type 1 diabetes mellitus with diabetic polyneuropathy: Secondary | ICD-10-CM | POA: Diagnosis not present

## 2022-09-08 ENCOUNTER — Telehealth: Payer: Self-pay | Admitting: Family Medicine

## 2022-09-08 NOTE — Telephone Encounter (Signed)
Tiffany Browning with [facility] states that they received the health statement and there were corrections and they just want to confirm the changes were made by the provider. Please call Tiffany Browning back at [phone]

## 2022-09-09 NOTE — Telephone Encounter (Signed)
Spoke with [place]. Corrected the date on the form. Form was faxed back.

## 2022-09-14 DIAGNOSIS — H2101 Hyphema, right eye: Secondary | ICD-10-CM | POA: Diagnosis not present

## 2022-09-14 DIAGNOSIS — E1136 Type 2 diabetes mellitus with diabetic cataract: Secondary | ICD-10-CM | POA: Diagnosis not present

## 2022-09-14 DIAGNOSIS — Z794 Long term (current) use of insulin: Secondary | ICD-10-CM | POA: Diagnosis not present

## 2022-09-14 DIAGNOSIS — Z888 Allergy status to other drugs, medicaments and biological substances status: Secondary | ICD-10-CM | POA: Diagnosis not present

## 2022-09-14 DIAGNOSIS — H2513 Age-related nuclear cataract, bilateral: Secondary | ICD-10-CM | POA: Diagnosis not present

## 2022-09-25 DIAGNOSIS — H25813 Combined forms of age-related cataract, bilateral: Secondary | ICD-10-CM | POA: Diagnosis not present

## 2022-10-06 DIAGNOSIS — Z79899 Other long term (current) drug therapy: Secondary | ICD-10-CM | POA: Diagnosis not present

## 2022-10-06 DIAGNOSIS — H25813 Combined forms of age-related cataract, bilateral: Secondary | ICD-10-CM | POA: Diagnosis not present

## 2022-10-06 DIAGNOSIS — E1036 Type 1 diabetes mellitus with diabetic cataract: Secondary | ICD-10-CM | POA: Diagnosis not present

## 2022-10-06 DIAGNOSIS — Z853 Personal history of malignant neoplasm of breast: Secondary | ICD-10-CM | POA: Diagnosis not present

## 2022-10-06 DIAGNOSIS — H25812 Combined forms of age-related cataract, left eye: Secondary | ICD-10-CM | POA: Diagnosis not present

## 2022-10-06 DIAGNOSIS — J45909 Unspecified asthma, uncomplicated: Secondary | ICD-10-CM | POA: Diagnosis not present

## 2022-10-06 DIAGNOSIS — E039 Hypothyroidism, unspecified: Secondary | ICD-10-CM | POA: Diagnosis not present

## 2022-10-08 DIAGNOSIS — E1042 Type 1 diabetes mellitus with diabetic polyneuropathy: Secondary | ICD-10-CM | POA: Diagnosis not present

## 2022-10-30 DIAGNOSIS — H59032 Cystoid macular edema following cataract surgery, left eye: Secondary | ICD-10-CM | POA: Diagnosis not present

## 2022-11-03 DIAGNOSIS — J45909 Unspecified asthma, uncomplicated: Secondary | ICD-10-CM | POA: Diagnosis not present

## 2022-11-03 DIAGNOSIS — H25811 Combined forms of age-related cataract, right eye: Secondary | ICD-10-CM | POA: Diagnosis not present

## 2022-11-03 DIAGNOSIS — E1136 Type 2 diabetes mellitus with diabetic cataract: Secondary | ICD-10-CM | POA: Diagnosis not present

## 2022-11-03 DIAGNOSIS — Z79899 Other long term (current) drug therapy: Secondary | ICD-10-CM | POA: Diagnosis not present

## 2022-11-03 DIAGNOSIS — H52221 Regular astigmatism, right eye: Secondary | ICD-10-CM | POA: Diagnosis not present

## 2022-11-07 DIAGNOSIS — E1042 Type 1 diabetes mellitus with diabetic polyneuropathy: Secondary | ICD-10-CM | POA: Diagnosis not present

## 2022-11-16 DIAGNOSIS — E1165 Type 2 diabetes mellitus with hyperglycemia: Secondary | ICD-10-CM | POA: Diagnosis not present

## 2022-12-07 DIAGNOSIS — E1042 Type 1 diabetes mellitus with diabetic polyneuropathy: Secondary | ICD-10-CM | POA: Diagnosis not present

## 2022-12-14 DIAGNOSIS — E039 Hypothyroidism, unspecified: Secondary | ICD-10-CM | POA: Diagnosis not present

## 2022-12-14 DIAGNOSIS — Z79899 Other long term (current) drug therapy: Secondary | ICD-10-CM | POA: Diagnosis not present

## 2022-12-14 DIAGNOSIS — D649 Anemia, unspecified: Secondary | ICD-10-CM | POA: Diagnosis not present

## 2022-12-14 DIAGNOSIS — Z853 Personal history of malignant neoplasm of breast: Secondary | ICD-10-CM | POA: Diagnosis not present

## 2022-12-14 DIAGNOSIS — E1042 Type 1 diabetes mellitus with diabetic polyneuropathy: Secondary | ICD-10-CM | POA: Diagnosis not present

## 2022-12-14 DIAGNOSIS — M8580 Other specified disorders of bone density and structure, unspecified site: Secondary | ICD-10-CM | POA: Diagnosis not present

## 2022-12-14 DIAGNOSIS — M8589 Other specified disorders of bone density and structure, multiple sites: Secondary | ICD-10-CM | POA: Diagnosis not present

## 2022-12-30 DIAGNOSIS — H59033 Cystoid macular edema following cataract surgery, bilateral: Secondary | ICD-10-CM | POA: Diagnosis not present

## 2023-01-03 ENCOUNTER — Other Ambulatory Visit: Payer: Self-pay | Admitting: Family Medicine

## 2023-01-03 DIAGNOSIS — J4520 Mild intermittent asthma, uncomplicated: Secondary | ICD-10-CM

## 2023-01-06 DIAGNOSIS — E1042 Type 1 diabetes mellitus with diabetic polyneuropathy: Secondary | ICD-10-CM | POA: Diagnosis not present

## 2023-01-07 DIAGNOSIS — Z4681 Encounter for fitting and adjustment of insulin pump: Secondary | ICD-10-CM | POA: Diagnosis not present

## 2023-01-07 DIAGNOSIS — E1042 Type 1 diabetes mellitus with diabetic polyneuropathy: Secondary | ICD-10-CM | POA: Diagnosis not present

## 2023-01-10 ENCOUNTER — Other Ambulatory Visit: Payer: Self-pay | Admitting: Family Medicine

## 2023-01-10 DIAGNOSIS — J4520 Mild intermittent asthma, uncomplicated: Secondary | ICD-10-CM

## 2023-02-03 DIAGNOSIS — E1165 Type 2 diabetes mellitus with hyperglycemia: Secondary | ICD-10-CM | POA: Diagnosis not present

## 2023-02-05 DIAGNOSIS — E1042 Type 1 diabetes mellitus with diabetic polyneuropathy: Secondary | ICD-10-CM | POA: Diagnosis not present

## 2023-02-12 DIAGNOSIS — Z961 Presence of intraocular lens: Secondary | ICD-10-CM | POA: Diagnosis not present

## 2023-02-12 DIAGNOSIS — H59033 Cystoid macular edema following cataract surgery, bilateral: Secondary | ICD-10-CM | POA: Diagnosis not present

## 2023-02-16 DIAGNOSIS — E1165 Type 2 diabetes mellitus with hyperglycemia: Secondary | ICD-10-CM | POA: Diagnosis not present

## 2023-03-01 DIAGNOSIS — M79641 Pain in right hand: Secondary | ICD-10-CM | POA: Diagnosis not present

## 2023-03-07 DIAGNOSIS — E1042 Type 1 diabetes mellitus with diabetic polyneuropathy: Secondary | ICD-10-CM | POA: Diagnosis not present

## 2023-03-09 ENCOUNTER — Telehealth: Payer: Self-pay | Admitting: Licensed Clinical Social Worker

## 2023-03-09 NOTE — Patient Outreach (Signed)
  Care Coordination   03/09/2023 Name: Tiffany Browning MRN: [id number] DOB: 22-Sep-1955   Care Coordination Outreach Attempts:  An unsuccessful telephone outreach was attempted today to offer the patient information about available care coordination services.  Follow Up Plan:  Additional outreach attempts will be made to offer the patient care coordination information and services.   Encounter Outcome:  No Answer   Care Coordination Interventions:  Yes, provided   Interventions Today    Flowsheet Row Most Recent Value  General Interventions   General Interventions Discussed/Reviewed Communication with  Communication with PCP/Specialists       Sammuel Hines, LCSW Social Work Care Coordination  [facility] [employer] [phone]

## 2023-03-15 ENCOUNTER — Ambulatory Visit: Payer: Self-pay | Admitting: Licensed Clinical Social Worker

## 2023-03-15 NOTE — Patient Instructions (Signed)
  It was a pleasure speaking with you today. Per your request a Care Coordination phone appointment is scheduled 03/16/23  Tiffany Hines, LCSW Social Work Care Coordination  [phone]

## 2023-03-15 NOTE — Patient Outreach (Signed)
  Care Coordination  Initial Visit Note   03/15/2023 Name: Tiffany Browning MRN: [id number] DOB: 1956-03-08  Tiffany Browning is a 67 y.o. year old female who sees Zola Button, Grayling Congress, DO for primary care. [name] by phone today.  What matters to the patients health and wellness today?    Patient scheduled phone appointment to obtain additional information about the Care Coordination Program..   SDOH assessments and interventions completed:  No  Care Coordination Interventions:  No, not indicated   Follow up plan: Follow up call scheduled for 03/16/23    Encounter Outcome:  Pt. Visit Completed   Sammuel Hines, LCSW Social Work Care Coordination  [facility] [employer] [phone]

## 2023-03-16 ENCOUNTER — Ambulatory Visit: Payer: Self-pay | Admitting: Licensed Clinical Social Worker

## 2023-03-16 NOTE — Patient Outreach (Signed)
  Care Coordination  Initial Visit Note   03/16/2023 Name: Tiffany Browning MRN: [id number] DOB: 03-25-1956  Tiffany Browning is a 67 y.o. year old female who sees Zola Button, Grayling Congress, DO for primary care. [name] by phone today.  What matters to the patients health and wellness today?  Healthy life style and managing her diabetes    Goals Addressed             This Visit's Progress    COMPLETED: Care Coordination connect with RN       Activities and task to complete in order to accomplish goals.   Keep all upcoming appointment discussed today Continue with compliance of taking medication prescribed by Doctor I have scheduled a phone appointment with the RN Care Manager she will provide educational informational and assist you with managing your health needs        SDOH assessments and interventions completed:  Yes  SDOH Interventions Today    Flowsheet Row Most Recent Value  SDOH Interventions   Food Insecurity Interventions Intervention Not Indicated  Housing Interventions Intervention Not Indicated  Transportation Interventions Intervention Not Indicated  Utilities Interventions Intervention Not Indicated  Financial Strain Interventions Intervention Not Indicated  Physical Activity Interventions Intervention Not Indicated  Stress Interventions Intervention Not Indicated  Health Literacy Interventions Intervention Not Indicated       Care Coordination Interventions:  Yes, provided  Interventions Today    Flowsheet Row Most Recent Value  Chronic Disease   Chronic disease during today's visit Diabetes  General Interventions   General Interventions Discussed/Reviewed General Interventions Discussed, Referral to Nurse  Theodosia Paling of Care Coordination Services]  Exercise Interventions   Exercise Discussed/Reviewed Exercise Discussed  Education Interventions   Education Provided Provided Education  Mental Health Interventions   Mental Health  Discussed/Reviewed Mental Health Discussed  Nutrition Interventions   Nutrition Discussed/Reviewed Nutrition Discussed  Pharmacy Interventions   Pharmacy Dicussed/Reviewed Pharmacy Topics Discussed  Advanced Directive Interventions   Advanced Directives Discussed/Reviewed Advanced Directives Discussed  [Has document will provide a copy for the office]       Follow up plan:  No further intervention required for LCSW Referral made to RN  Encounter Outcome:  Pt. Visit Completed   Sammuel Hines, LCSW Social Work Care Coordination  [facility] [employer] [phone]

## 2023-03-16 NOTE — Patient Instructions (Signed)
Social Work Visit Information  Thank you for taking time to visit with me today. Please don't hesitate to contact me if I can be of assistance to you.   Following are the goals we discussed today:   Goals Addressed             This Visit's Progress    COMPLETED: Care Coordination connect with RN       Activities and task to complete in order to accomplish goals.   Keep all upcoming appointment discussed today Continue with compliance of taking medication prescribed by Doctor I have scheduled a phone appointment with the RN Care Manager she will provide educational informational and assist you with managing your health needs         your next appointment is by telephone with the RN    Please call the care guide team at [phone] if you need to cancel or reschedule your appointment.   If you or anyone you know are experiencing a Mental Health or Behavioral Health Crisis or need someone to talk to, please call the Suicide and Crisis Lifeline: 988 call the [place] National Suicide Prevention Lifeline: 662-004-8732 or TTY: 714-458-4925 TTY 4376010125) to talk to a trained counselor call 1-800-273-TALK (toll free, 24 hour hotline) go to [facility] [address] [place] [phone])   Patient verbalizes understanding of instructions and care plan provided today and agrees to view in MyChart. Active MyChart status and patient understanding of how to access instructions and care plan via MyChart confirmed with patient.       Sammuel Hines, LCSW Social Work Care Coordination  [facility] [employer] [phone]

## 2023-03-29 DIAGNOSIS — E113212 Type 2 diabetes mellitus with mild nonproliferative diabetic retinopathy with macular edema, left eye: Secondary | ICD-10-CM | POA: Diagnosis not present

## 2023-03-29 DIAGNOSIS — H35353 Cystoid macular degeneration, bilateral: Secondary | ICD-10-CM | POA: Diagnosis not present

## 2023-03-29 DIAGNOSIS — H43813 Vitreous degeneration, bilateral: Secondary | ICD-10-CM | POA: Diagnosis not present

## 2023-03-30 DIAGNOSIS — E039 Hypothyroidism, unspecified: Secondary | ICD-10-CM | POA: Diagnosis not present

## 2023-03-30 DIAGNOSIS — D649 Anemia, unspecified: Secondary | ICD-10-CM | POA: Diagnosis not present

## 2023-03-30 DIAGNOSIS — M8580 Other specified disorders of bone density and structure, unspecified site: Secondary | ICD-10-CM | POA: Diagnosis not present

## 2023-03-30 DIAGNOSIS — Z853 Personal history of malignant neoplasm of breast: Secondary | ICD-10-CM | POA: Diagnosis not present

## 2023-03-30 DIAGNOSIS — E1042 Type 1 diabetes mellitus with diabetic polyneuropathy: Secondary | ICD-10-CM | POA: Diagnosis not present

## 2023-04-05 ENCOUNTER — Ambulatory Visit: Payer: Self-pay

## 2023-04-05 NOTE — Patient Outreach (Signed)
  Care Coordination   Initial Visit Note   04/05/2023 Name: Tiffany Browning MRN: [id number] DOB: 1955-10-13  Tiffany Browning is a 67 y.o. year old female who sees Tiffany Browning, Tiffany Congress, DO for primary care. I spoke with  Tiffany Browning by phone today.  What matters to the patients health and wellness today?  Tiffany Browning reports recent visit with endocrinologist 03/30/23 A1C 7.1. she states she has noticed 5 pound weight increase over the year and noticed it is harder to get weight off now. She states she is very active; has Systems analyst at least 3 times/week.  Patient reports usual meal: breakfast:  wheat toast, dab jelly with 2 boilded eggs; lunch chicken noodle soup 1/2can with pack of tuna or samon; fruit in between; dinner chicken vegetables, backed salmon. Snack skinny popcorn around 7:30 pm. She reports occasional low blood sugar reading has freestyle libre 3. She would like to maintain or decrease weight 5 pounds.  Goals Addressed             This Visit's Progress    maintain or decrease weight by 5 pounds       Interventions Today    Flowsheet Row Most Recent Value  Chronic Disease   Chronic disease during today's visit Diabetes  General Interventions   General Interventions Discussed/Reviewed General Interventions Discussed  Exercise Interventions   Exercise Discussed/Reviewed Exercise Discussed, Physical Activity  Physical Activity Discussed/Reviewed Physical Activity Discussed  Education Interventions   Education Provided Provided Web-based Education, Provided Education  [assigned: emmi Diabetes: nutrition and healthy eating,  diabetes: carbohydrate counting,  low blood sugar in type 1 diabetes overview and blood sugar emergencies for type 1 diabets.]  Provided Verbal Education On Other, Medication, Blood Sugar Monitoring, Nutrition, Exercise  [advised to continue to remain active and eat healthy, continue to monitor blood sugar response to exercise.]  Nutrition  Interventions   Nutrition Discussed/Reviewed Nutrition Discussed  [discussed [facility] and diabetes education service-provided contact number [phone]. advised to discuss with provider.]  Pharmacy Interventions   Pharmacy Dicussed/Reviewed Pharmacy Topics Discussed  [medications review completed]            SDOH assessments and interventions completed:  No  Care Coordination Interventions:  Yes, provided   Follow up plan: Follow up call scheduled for 05/06/23    Encounter Outcome:  Pt. Visit Completed   Tiffany Sheriff, RN, MSN, BSN, CCM [facility] Care Coordinator [phone]

## 2023-04-05 NOTE — Patient Instructions (Addendum)
Visit Information  Thank you for taking time to visit with me today. Please don't hesitate to contact me if I can be of assistance to you.   Following are the goals we discussed today:   Goals Addressed             This Visit's Progress    maintain or decrease weight by 5 pounds       Interventions Today    Flowsheet Row Most Recent Value  Chronic Disease   Chronic disease during today's visit Diabetes  General Interventions   General Interventions Discussed/Reviewed General Interventions Discussed  Exercise Interventions   Exercise Discussed/Reviewed Exercise Discussed, Physical Activity  Physical Activity Discussed/Reviewed Physical Activity Discussed  Education Interventions   Education Provided Provided Web-based Education, Provided Education  [assigned: emmi Diabetes: nutrition and healthy eating,  diabetes: carbohydrate counting,  low blood sugar in type 1 diabetes overview and blood sugar emergencies for type 1 diabets.]  Provided Verbal Education On Other, Medication, Blood Sugar Monitoring, Nutrition, Exercise  [advised to continue to remain active and eat healthy, continue to monitor blood sugar response to exercise.]  Nutrition Interventions   Nutrition Discussed/Reviewed Nutrition Discussed  [discussed [facility] Nutrition and diabetes education service-provided contact number [phone]. advised to discuss with provider.]  Pharmacy Interventions   Pharmacy Dicussed/Reviewed Pharmacy Topics Discussed  [medications review completed]            Our next appointment is by telephone on 05/06/23 at 9:00 am  Please call the care guide team at [phone] if you need to cancel or reschedule your appointment.   If you are experiencing a Mental Health or Behavioral Health Crisis or need someone to talk to, please call the Suicide and Crisis Lifeline: 988 call the [place] National Suicide Prevention Lifeline: (239)600-3707 or TTY: (904) 222-5907 TTY (502)560-7225) to talk to a  trained counselor call 1-800-273-TALK (toll free, 24 hour hotline)  Kathyrn Sheriff, RN, MSN, BSN, CCM [facility] Care Coordinator [phone]

## 2023-04-06 DIAGNOSIS — E1042 Type 1 diabetes mellitus with diabetic polyneuropathy: Secondary | ICD-10-CM | POA: Diagnosis not present

## 2023-04-14 ENCOUNTER — Other Ambulatory Visit: Payer: Self-pay | Admitting: Family Medicine

## 2023-04-14 DIAGNOSIS — J4520 Mild intermittent asthma, uncomplicated: Secondary | ICD-10-CM

## 2023-04-22 ENCOUNTER — Other Ambulatory Visit: Payer: Self-pay | Admitting: Family Medicine

## 2023-04-22 DIAGNOSIS — Z1231 Encounter for screening mammogram for malignant neoplasm of breast: Secondary | ICD-10-CM

## 2023-05-06 ENCOUNTER — Ambulatory Visit: Payer: Self-pay

## 2023-05-06 NOTE — Patient Instructions (Signed)
Visit Information  Thank you for taking time to visit with me today. Please don't hesitate to contact me if I can be of assistance to you.   Following are the goals we discussed today:  Continue to take medications as prescribed. Contact your provider to notify of addition of any OTC medications Continue to attend provider visits as scheduled Continue to eat healthy, lean meats, vegetables, fruits, avoid saturated and transfats Contact provider with health questions or concerns Plan to received a phone call from the clinical pharmacist to discuss medications  Our next appointment is by telephone on 06/08/23 at 10:00 am  Please call the care guide team at [phone] if you need to cancel or reschedule your appointment.   If you are experiencing a Mental Health or Behavioral Health Crisis or need someone to talk to, please call the Suicide and Crisis Lifeline: 988 call the [place] National Suicide Prevention Lifeline: (470)224-2078 or TTY: 843-131-0626 TTY (541)351-1283) to talk to a trained counselor call 1-800-273-TALK (toll free, 24 hour hotline)  Kathyrn Sheriff, RN, MSN, BSN, CCM Care Management Coordinator [phone]

## 2023-05-06 NOTE — Patient Outreach (Signed)
Care Coordination   Follow Up Visit Note   05/06/2023 Name: Tiffany Browning MRN: [id number] DOB: 08-28-1955  Tiffany Browning is a 67 y.o. year old female who sees Zola Button, Grayling Congress, DO for primary care. [name] by phone today.  What matters to the patients health and wellness today?  Continues to work with Systems analyst, saw endocrinologist on 04/09/23. Per patient A1C 7.2 ; thyroid function was low and endocrinologist increased thyroid medication to 175 mcg daily. Ms. Matherly states she has started an over the counter medication, Alli, to help with weight loss. She is receptive to clinical pharmacy referral regarding side effects and if any possible drug interactions. Ms. Stude reports Cataract surgery right eye Feb and left eye in March. 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 She now reports a blood vessel burst in left eye. She reports an appointment with ophthalmologist today. She reports she is being followed by an ophthalmologist and a retina specialist.   Goals Addressed             This Visit's Progress    maintain or decrease weight by 5 pounds       Interventions Today    Flowsheet Row Most Recent Value  Chronic Disease   Chronic disease during today's visit Diabetes  General Interventions   General Interventions Discussed/Reviewed General Interventions Reviewed  Education Interventions   Education Provided Provided Education  Provided Verbal Education On Blood Sugar Monitoring, When to see the doctor, Medication, Exercise  [advised take medications as prescribed, attend provider visits as recommended, work with trainer per provider recommendation, check blood sugar,  iadvised contact provider with health questions concerns as needed]  Nutrition Interventions   Nutrition Discussed/Reviewed Fluid intake, Nutrition Reviewed  [increasing water intake]  Pharmacy Interventions   Pharmacy Dicussed/Reviewed Pharmacy  Topics Reviewed, Referral to Pharmacist  [discussed alli and how it works by blocking absorption of  fat in the intestines. patient encouraged to discuss with provider and pharmaicst]  Referral to Pharmacist Drug interaction/side effects  [started Alli for weight loss]            SDOH assessments and interventions completed:  No  Care Coordination Interventions:  Yes, provided   Follow up plan: Follow up call scheduled for 06/08/23    Encounter Outcome:  Patient Visit Completed   Kathyrn Sheriff, RN, MSN, BSN, CCM Care Management Coordinator [phone]

## 2023-05-07 DIAGNOSIS — E109 Type 1 diabetes mellitus without complications: Secondary | ICD-10-CM | POA: Diagnosis not present

## 2023-05-19 ENCOUNTER — Ambulatory Visit
Admission: RE | Admit: 2023-05-19 | Discharge: 2023-05-19 | Disposition: A | Discharge status: Home / Self Care | Payer: Medicare Other | Source: Ambulatory Visit | Attending: Family Medicine | Admitting: Family Medicine

## 2023-05-19 ENCOUNTER — Telehealth: Payer: Self-pay

## 2023-05-19 DIAGNOSIS — Z1231 Encounter for screening mammogram for malignant neoplasm of breast: Secondary | ICD-10-CM | POA: Diagnosis not present

## 2023-05-19 DIAGNOSIS — E1042 Type 1 diabetes mellitus with diabetic polyneuropathy: Secondary | ICD-10-CM | POA: Diagnosis not present

## 2023-05-19 NOTE — Progress Notes (Signed)
Care Guide Note  05/19/2023 Name: Tiffany Browning MRN: [id number] DOB: 1956-06-06  Referred by: Donato Schultz, DO Reason for referral : Care Coordination (Outreach to schedule with Pharm d )   Tiffany Browning is a 67 y.o. year old female who is a primary care patient of Donato Schultz, DO. Tiffany Browning was referred to the pharmacist for assistance related to med review.    Successful contact was made with the patient to discuss pharmacy services including being ready for the pharmacist to call at least 5 minutes before the scheduled appointment time, to have medication bottles and any blood sugar or blood pressure readings ready for review. The patient agreed to meet with the pharmacist via with the pharmacist via telephone visit on (date/time).  05/26/2023  Penne Lash, RMA Care Guide [facility]  [place], [place] [address] Direct Dial: [phone] [email or web address]

## 2023-05-19 NOTE — Progress Notes (Signed)
Care Guide Note  05/19/2023 Name: JANARIAH MARA MRN: [id number] DOB: 26-Aug-1955  Referred by: Donato Schultz, DO Reason for referral : Care Coordination (Outreach to schedule with Pharm d )   DEMONI LAWTON is a 67 y.o. year old female who is a primary care patient of Donato Schultz, DO. [name] was referred to the pharmacist for assistance related to  Med review  .    An unsuccessful telephone outreach was attempted today to contact the patient who was referred to the pharmacy team for assistance with medication management. Additional attempts will be made to contact the patient.   Penne Lash, RMA Care Guide [facility]  [place], [place] [address] Direct Dial: [phone] [email or web address]

## 2023-05-21 ENCOUNTER — Other Ambulatory Visit: Payer: Self-pay | Admitting: Family Medicine

## 2023-05-21 DIAGNOSIS — Z1212 Encounter for screening for malignant neoplasm of rectum: Secondary | ICD-10-CM

## 2023-05-21 DIAGNOSIS — Z1211 Encounter for screening for malignant neoplasm of colon: Secondary | ICD-10-CM

## 2023-05-24 DIAGNOSIS — H35353 Cystoid macular degeneration, bilateral: Secondary | ICD-10-CM | POA: Diagnosis not present

## 2023-05-24 DIAGNOSIS — E119 Type 2 diabetes mellitus without complications: Secondary | ICD-10-CM | POA: Diagnosis not present

## 2023-05-24 DIAGNOSIS — E113212 Type 2 diabetes mellitus with mild nonproliferative diabetic retinopathy with macular edema, left eye: Secondary | ICD-10-CM | POA: Diagnosis not present

## 2023-05-24 DIAGNOSIS — H43813 Vitreous degeneration, bilateral: Secondary | ICD-10-CM | POA: Diagnosis not present

## 2023-05-26 ENCOUNTER — Ambulatory Visit (INDEPENDENT_AMBULATORY_CARE_PROVIDER_SITE_OTHER): Payer: Medicare Other | Admitting: Pharmacist

## 2023-05-26 DIAGNOSIS — E785 Hyperlipidemia, unspecified: Secondary | ICD-10-CM

## 2023-05-26 DIAGNOSIS — Z79899 Other long term (current) drug therapy: Secondary | ICD-10-CM

## 2023-05-26 MED ORDER — ROSUVASTATIN CALCIUM 5 MG PO TABS: 5.0000 mg | ORAL_TABLET | Freq: Every day | ORAL | 0 refills | Status: DC

## 2023-05-26 NOTE — Progress Notes (Signed)
05/26/2023 Name: Tiffany Browning MRN: [id number] DOB: [birth year]  Chief Complaint  Patient presents with   Medication Management    assessment    Tiffany Browning is a 67 y.o. year old female who presented for a telephone visit.   They were referred to the pharmacist by their Case Management Team  for assistance in managing complex medication management.    Subjective: Patient spoke with care management nurse who noted patient was taking Alli over-the-counter. I was asked to discuss possible drug-drug interactions with Alli and potential side effects.   Patient is concerned about her weight.  Last BMI based on weight from office visit in November 2024 was 25.5 She mentioned weight gain at appointment 03/20/2023 with Dr Evlyn Kanner. He was was concerned about thyroid not being adequately supplemented. He checked TSH and was elevated. Dose of levothyroxine was increased from to daily.    Patient started over-the-counter Alli about 1 month ago but hasn't seen much change in weight.  She is taking Alli 60mg  prior to each meal.   Has diabetes that is currently managed with insulin pump.   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   Current meal patterns:  - Breakfast:  At 7 am 2 boiled eggs (yolk removed from 1 egg)  with toast and jelly Then to gym at 8am Takes levothyroxine when she gets home at 9:30 or 10am - Lunch: 11:30 to noon - soup with fruit; sometimes a few crackers (10 or less) - Supper: 5 to 6pm - rotisserie chicken, salmon, pot roast + green or salad + carb - Snacks: grapes or apple - Bedtime usually 10pm to 11:30p  Current physical activity: 3 times per week with personal trainer  Annual Eye Exam - done on September 2024 - Tiffany Browning    Objective:  Lab Results  Component Value Date   HGBA1C 7.1 (H) 12/17/2021    Lab Results  Component Value Date   CREATININE  0.87 06/23/2022   BUN 11 06/23/2022   NA 138 06/23/2022   K 5.1 06/23/2022   CL 104 06/23/2022   CO2 28 06/23/2022    Lab Results  Component Value Date   CHOL 163 06/23/2022   HDL 57.40 06/23/2022   LDLCALC 85 06/23/2022   TRIG 100.0 06/23/2022   CHOLHDL 3 06/23/2022    Medications Reviewed Today     Reviewed by Henrene Pastor, RPH-CPP (Pharmacist) on 05/26/23 at 1139  Med List Status: <None>   Medication Order Taking? Sig Documenting Provider Last Dose Status Informant  Alpha-Lipoic Acid 300 MG CAPS [id number] Yes Take by mouth daily. [provider] Taking Active   Calcium Carb-Cholecalciferol (CALTRATE 600+D3) 600-800 MG-UNIT TABS [id number] Yes 1x a day [provider] Taking Active   Calcium-Magnesium-Vitamin D (CALCIUM MAGNESIUM PO) [id number] Yes Take 1 tablet by mouth daily. [provider] Taking Active   Cinnamon 500 MG capsule [id number] Yes Take 500 mg by mouth daily. [provider] Taking Active   fish oil-omega-3 fatty acids 1000 MG capsule [id number] Yes Take 2 g by mouth daily. [provider] Taking Active Self  Fluticasone Furoate (ARNUITY ELLIPTA) 50 MCG/ACT AEPB [id number] Yes USE 1 INHALATION BY MOUTH DAILY Donato Schultz, DO Taking Active   glucose blood (FREESTYLE LITE) test strip [id number] Yes Check blood sugar 6 times a day  Taking Active   Insulin Lispro-aabc (LYUMJEV) 100 UNIT/ML  SOLN [id number] Yes Inject up to 35 Units as directed with insulin pump  Taking Active   ketorolac (ACULAR) 0.5 % ophthalmic solution [id number] Yes Place 1 drop into both eyes 4 (four) times daily. Per patient recent cataract surgery [provider] Taking Active   levothyroxine (SYNTHROID) 175 MCG tablet [id number] Yes Take 175 mcg by mouth daily before breakfast. [provider] Taking Active Self  montelukast (SINGULAIR) 10 MG tablet [id number] Yes TAKE 1 TABLET BY MOUTH AT  BEDTIME PT DUE FOR OFFICE VISIT Margaree Mackintosh, MD Taking Active   Multiple Vitamin (MULTIVITAMIN) tablet [id number] Yes Take 1 tablet by mouth daily. [provider] Taking Active Self  orlistat (ALLI) 60 MG capsule [id number] Yes Take 60 mg by mouth 3 (three) times daily with meals. [provider] Taking Active Self  rosuvastatin (CRESTOR) 5 MG tablet [id number] Yes Take 1 tablet (5 mg total) by mouth daily.  Taking Active               Assessment/Plan:  Overweight - patient's last weight was a year ago and indicated a BMI that was just slightly over 25.  - Recommended she adjust medication administration schedule.   Take levothyroxine daily when she wakes. After 30 minutes she can eat breakfast.   Recommended trial of taking Alli 60 to 120mg  only with lunch and evening meal to avoid possible affects on levothyroxine absorption (recommended to separate levothyroxine and Alli by at least 4 hours)   Recommended she take Fish oil at different time of day from [employer].  - Recommended she continue to take multivitamin and discussed that Alli can decrease absorption of fat soluble vitamins.  - Reviewed possible side effects of Alli - loose stools / diarrhea or fecal leakage Explained this is more likely to occur if she eats a high fat meal.  - Continue to follow low calorie diet and exercise 3 times per week.  - She is noted to have type 1 DM so not sure that agents like Mounjaro, Ozempic or Trulicity would help with blood glucose (last A1c was 7.2%). Could help to lower amount of insulin needed and decrease weight. Patient to discuss further with Dr Evlyn Kanner - [facility] and requested annual eye exam documentation be faxed to our office. He to leave a message on VM.  - rosuvastatin Rx needed updated - sent Rx to [facility]   Meds ordered this encounter  Medications   rosuvastatin (CRESTOR) 5 MG tablet    Sig: Take 1 tablet (5 mg total) by mouth daily.    Dispense:  90 tablet    Refill:  0    Fill  or profile based on patient's refill schedule.     Follow Up Plan: no follow up needed but patient is aware if she has further questions about medications she can contact me thru PCP office.   Henrene Pastor, PharmD Clinical Pharmacist [facility]

## 2023-06-08 ENCOUNTER — Ambulatory Visit: Payer: Self-pay

## 2023-06-08 NOTE — Patient Instructions (Signed)
Visit Information  Thank you for taking time to visit with me today. Please don't hesitate to contact me if I can be of assistance to you.   Following are the goals we discussed today:  Continue to take medications as prescribed. Continue to attend provider visits as scheduled Continue to eat healthy, lean meats, vegetables, fruits, avoid saturated and transfats Contact provider with health questions or concerns   Our next appointment is by telephone on 08/03/23 at 10:00 am  Please call the care guide team at [phone] if you need to cancel or reschedule your appointment.   If you are experiencing a Mental Health or Behavioral Health Crisis or need someone to talk to, please call the Suicide and Crisis Lifeline: 988 call the [place] National Suicide Prevention Lifeline: 817 368 7355 or TTY: 564-808-8885 TTY 240 440 0863) to talk to a trained counselor call 1-800-273-TALK (toll free, 24 hour hotline)  Kathyrn Sheriff, RN, MSN, BSN, CCM Care Management Coordinator [phone]

## 2023-06-08 NOTE — Patient Outreach (Signed)
Care Coordination   Follow Up Visit Note   06/08/2023 Name: Tiffany Browning MRN: [id number] DOB: Jan 29, 1956  Tiffany Browning is a 67 y.o. year old female who sees Zola Button, Grayling Congress, DO for primary care. I spoke with  Tiffany Browning by phone today.  What matters to the patients health and wellness today?  [name] reports she continues to work on healthy lifestyle. She continues to work with Systems analyst and monitor food choices. She reports improvement from bursted blood vessel in eye and adds eye drops have been discontinued-continues to see ophthalmologist(next visit 07/12/23. She denies any questions or concerns at this time.   Goals Addressed             This Visit's Progress    maintain or decrease weight by 5 pounds       Interventions Today    Flowsheet Row Most Recent Value  Chronic Disease   Chronic disease during today's visit Diabetes  General Interventions   General Interventions Discussed/Reviewed General Interventions Reviewed, Doctor Visits  [Evaluation of current treatment plan for health condition and patient's adherence to plan.]  Doctor Visits Discussed/Reviewed Doctor Visits Reviewed, Specialist  PCP/Specialist Visits Compliance with follow-up visit  [reviewed upcoming/scheduled provider visits.]  Exercise Interventions   Exercise Discussed/Reviewed Exercise Reviewed  Education Interventions   Education Provided Provided Education  [reviewed patient instructions per clinical pharmacist telephone visit on 05/19/23.]  Provided Verbal Education On Other, Blood Sugar Monitoring, Medication, When to see the doctor  [advised to continue to take medications as prescribed, attend provider visits as scheduled, check blood sugar as recommended by provider, eat healthy, contact provider with health questions or concerns.]  Nutrition Interventions   Nutrition Discussed/Reviewed Nutrition Reviewed  Pharmacy Interventions   Pharmacy Dicussed/Reviewed Pharmacy  Topics Reviewed            SDOH assessments and interventions completed:  No  Care Coordination Interventions:  Yes, provided   Follow up plan: Follow up call scheduled for 08/03/23    Encounter Outcome:  Patient Visit Completed   Kathyrn Sheriff, RN, MSN, BSN, CCM Care Management Coordinator [phone]

## 2023-06-25 ENCOUNTER — Other Ambulatory Visit: Payer: Self-pay | Admitting: Internal Medicine

## 2023-06-25 DIAGNOSIS — J4520 Mild intermittent asthma, uncomplicated: Secondary | ICD-10-CM

## 2023-07-01 ENCOUNTER — Ambulatory Visit: Payer: Medicare Other

## 2023-07-01 DIAGNOSIS — Z0000 Encounter for general adult medical examination without abnormal findings: Secondary | ICD-10-CM

## 2023-07-01 NOTE — Progress Notes (Signed)
Subjective:   Tiffany Browning is a 67 y.o. female who presents for Medicare Annual (Subsequent) preventive examination.  Visit Complete: Virtual I connected with  Tiffany Browning on 07/01/23 by a audio enabled telemedicine application and verified that I am speaking with the correct person using two identifiers.  Patient Location: Home  Provider Location: Office/Clinic  I discussed the limitations of evaluation and management by telemedicine. The patient expressed understanding and agreed to proceed.  Vital Signs: Because this visit was a virtual/telehealth visit, some criteria may be missing or patient reported. Any vitals not documented were not able to be obtained and vitals that have been documented are patient reported.  Patient Medicare AWV questionnaire was completed by the patient on 06/29/23; I have confirmed that all information answered by patient is correct and no changes since this date.  Cardiac Risk Factors include: advanced age (>59men, >49 women);dyslipidemia;diabetes mellitus     Objective:    There were no vitals filed for this visit. There is no height or weight on file to calculate BMI.     07/01/2023    1:02 PM 02/05/2022    7:12 AM 01/28/2022    1:21 PM 07/04/2020    7:05 AM 06/28/2020   12:04 PM 03/29/2020    9:41 AM 02/09/2019    8:12 AM  Advanced Directives  Does Patient Have a Medical Advance Directive? Yes Yes Yes No No Yes Yes  Type of Estate agent of [place];Living will Healthcare Power of [place];Living will Healthcare Power of [place];Living will   Healthcare Power of [place];Living will Living will  Does patient want to make changes to medical advance directive? No - Patient declined No - Patient declined No - Patient declined    Yes (Inpatient - patient defers changing a medical advance directive at this time - Information given)  Copy of Healthcare Power of Attorney in Chart? Yes - validated most recent copy scanned in  chart (See row information) Yes - validated most recent copy scanned in chart (See row information) No - copy requested   No - copy requested   Would patient like information on creating a medical advance directive?    No - Patient declined No - Patient declined      Current Medications (verified) Outpatient Encounter Medications as of 07/01/2023  Medication Sig   Alpha-Lipoic Acid 300 MG CAPS Take by mouth daily.   Calcium Carb-Cholecalciferol (CALTRATE 600+D3) 600-800 MG-UNIT TABS 1x a day (Patient not taking: Reported on 06/08/2023)   Calcium-Magnesium-Vitamin D (CALCIUM MAGNESIUM PO) Take 1 tablet by mouth daily.   Cinnamon 500 MG capsule Take 500 mg by mouth daily.   fish oil-omega-3 fatty acids 1000 MG capsule Take 2 g by mouth daily.   Fluticasone Furoate (ARNUITY ELLIPTA) 50 MCG/ACT AEPB USE 1 INHALATION BY MOUTH DAILY   glucose blood (FREESTYLE LITE) test strip Check blood sugar 6 times a day   Insulin Lispro-aabc (LYUMJEV) 100 UNIT/ML SOLN Inject up to 35 Units as directed with insulin pump   levothyroxine (SYNTHROID) 175 MCG tablet Take 175 mcg by mouth daily before breakfast.   montelukast (SINGULAIR) 10 MG tablet TAKE 1 TABLET BY MOUTH AT  BEDTIME PT DUE FOR OFFICE VISIT   Multiple Vitamin (MULTIVITAMIN) tablet Take 1 tablet by mouth daily.   orlistat (ALLI) 60 MG capsule Take 60 mg by mouth 3 (three) times daily with meals.   rosuvastatin (CRESTOR) 5 MG tablet Take 1 tablet (5 mg total) by mouth daily.   [  DISCONTINUED] ketorolac (ACULAR) 0.5 % ophthalmic solution Place 1 drop into both eyes 4 (four) times daily. Per patient recent cataract surgery (Patient not taking: Reported on 06/08/2023)   No facility-administered encounter medications on file as of 07/01/2023.    Allergies (verified) Patient has no known allergies.   History: Past Medical History:  Diagnosis Date   Allergy    Asthma    under control   Breast cancer (HCC)    Diabetes mellitus    Type 1-on insulin  pump   Family history of breast cancer    Family history of kidney cancer    Heart murmur    Hypothyroidism    Personal history of radiation therapy    Thyroid disease    Hypothyroidism   Past Surgical History:  Procedure Laterality Date   ABDOMINAL HYSTERECTOMY     APPENDECTOMY     BREAST BIOPSY Right 02/14/2016   BREAST BIOPSY Right 01/09/2016   BREAST LUMPECTOMY Right 03/26/2016   BREAST LUMPECTOMY WITH AXILLARY LYMPH NODE BIOPSY Right 03/26/2016   Procedure: RIGHT BREAST LUMPECTOMY WITH AXILLARY LYMPH NODE BIOPSY;  Surgeon: Chevis Pretty III, MD;  Location: [facility] OR;  Service: General;  Laterality: Right;   CARPAL TUNNEL RELEASE Right 02/09/2019   Procedure: RIGHT CARPAL TUNNEL RELEASE;  Surgeon: Cindee Salt, MD;  Location: [facility];  Service: Orthopedics;  Laterality: Right;   CARPAL TUNNEL RELEASE Left 02/05/2022   Procedure: CARPAL TUNNEL RELEASE;  Surgeon: Betha Loa, MD;  Location: [facility];  Service: Orthopedics;  Laterality: Left;  30 MIN   NASAL SINUS SURGERY     ruptured ovarian cyst     SHOULDER ARTHROSCOPY  2004   b/L --second one 2011   TONSILECTOMY, ADENOIDECTOMY, BILATERAL MYRINGOTOMY AND TUBES     TRIGGER FINGER RELEASE Right 07/04/2020   Procedure: RELEASE TRIGGER FINGER/A-1 PULLEY;  Surgeon: Cindee Salt, MD;  Location: [facility];  Service: Orthopedics;  Laterality: Right;  IV REGIONAL FOREARM BLOCK   TUBAL LIGATION     then reversed it   Family History  Problem Relation Age of Onset   Cancer Mother        cervical   Hypertension Mother    Hyperlipidemia Mother    Arthritis Mother    Heart disease Father 52       MI---- cabg before that   Alcohol abuse Father    Hypothyroidism Sister    Breast cancer Sister 105   Cancer Sister    Heart disease Brother 65       MI--- second one 44   Hyperlipidemia Brother    Hypertension Brother    Hypothyroidism Sister    Kidney cancer Other 29       son of her sister with  breast cancer   Social History   Socioeconomic History   Marital status: Married    Spouse name: Tiffany Browning   Number of children: 0   Years of education: Not on file   Highest education level: Not on file  Occupational History   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  Tobacco Use   Smoking status: Never   Smokeless tobacco: Never  Vaping Use   Vaping status: Never Used  Substance and Sexual Activity   Alcohol use: Yes    Comment: occ   Drug use: No   Sexual activity: Yes    Partners: Male  Other Topics Concern   Not on file  Social History Narrative   Not on file  Social Determinants of Health   Financial Resource Strain: Low Risk  (06/29/2023)   Overall Financial Resource Strain (CARDIA)    Difficulty of Paying Living Expenses: Not hard at all  Food Insecurity: No Food Insecurity (06/29/2023)   Hunger Vital Sign    Worried About Running Out of Food in the Last Year: Never true    Ran Out of Food in the Last Year: Never true  Transportation Needs: No Transportation Needs (06/29/2023)   PRAPARE - Administrator, Civil Service (Medical): No    Lack of Transportation (Non-Medical): No  Physical Activity: Sufficiently Active (06/29/2023)   Exercise Vital Sign    Days of Exercise per Week: 3 days    Minutes of Exercise per Session: 60 min  Stress: No Stress Concern Present (06/29/2023)   [employer] of Occupational Health - Occupational Stress Questionnaire    Feeling of Stress : Only a little  Social Connections: Unknown (06/29/2023)   Social Connection and Isolation Panel [NHANES]    Frequency of Communication with Friends and Family: Once a week    Frequency of Social Gatherings with Friends and Family: Once a week    Attends Religious Services: Not on Marketing executive or Organizations: Yes    Attends Banker Meetings: 1 to 4 times per year    Marital Status: Married    Tobacco Counseling Counseling given: Not  Answered   Clinical Intake:  Pre-visit preparation completed: Yes  Pain : No/denies pain  Nutritional Risks: None Diabetes: Yes CBG done?: No Did pt. bring in CBG monitor from home?: No  How often do you need to have someone help you when you read instructions, pamphlets, or other written materials from your doctor or pharmacy?: 1 - Never  Interpreter Needed?: No  Information entered by :: Donne Anon, CMA   Activities of Daily Living    06/29/2023    8:48 AM  In your present state of health, do you have any difficulty performing the following activities:  Hearing? 0  Vision? 0  Difficulty concentrating or making decisions? 0  Walking or climbing stairs? 0  Dressing or bathing? 0  Doing errands, shopping? 0  Preparing Food and eating ? N  Using the Toilet? N  In the past six months, have you accidently leaked urine? Y  Do you have problems with loss of bowel control? N  Managing your Medications? N  Managing your Finances? N  Housekeeping or managing your Housekeeping? N    Patient Care Team: Zola Button, Grayling Congress, DO as PCP - General (Family Medicine) Adrian Prince, MD as Consulting Physician (Endocrinology) Griselda Miner, MD as Consulting Physician (General Surgery) [name], Valentino Hue, MD (Inactive) as Consulting Physician (Oncology) Lonie Peak, MD as Attending Physician (Radiation Oncology) Loa Socks, NP as Nurse Practitioner (Hematology and Oncology)  Indicate any recent Medical Services you may have received from other than [facility] providers in the past year (date may be approximate).     Assessment:   This is a routine wellness examination for Darrian.  Hearing/Vision screen No results found.   Goals Addressed   None    Depression Screen    07/01/2023    1:09 PM 06/23/2022    1:40 PM 04/04/2021   10:27 AM 05/04/2016    8:13 AM 02/05/2016    2:04 PM  PHQ 2/9 Scores  PHQ - 2 Score 0 0 0 0 0    Fall Risk  06/29/2023    8:48  AM 06/23/2022    1:51 PM 06/23/2022    1:40 PM 04/04/2021   10:26 AM 05/04/2016    8:13 AM  Fall Risk   Falls in the past year? 0 0 0 0 No  Number falls in past yr: 0 0 0 0   Injury with Fall? 0 0 0 0   Risk for fall due to : No Fall Risks No Fall Risks     Follow up Falls evaluation completed Falls evaluation completed  Falls evaluation completed     MEDICARE RISK AT HOME: Medicare Risk at Home Any stairs in or around the home?: Yes If so, are there any without handrails?: Yes Home free of loose throw rugs in walkways, pet beds, electrical cords, etc?: Yes Adequate lighting in your home to reduce risk of falls?: Yes Life alert?: No Use of a cane, walker or w/c?: No Grab bars in the bathroom?: Yes Shower chair or bench in shower?: Yes Elevated toilet seat or a handicapped toilet?: No  TIMED UP AND GO:  Was the test performed?  No    Cognitive Function:    06/23/2022    1:57 PM  MMSE - Mini Mental State Exam  Orientation to time 5  Orientation to Place 5  Registration 3  Attention/ Calculation 5  Recall 3  Language- name 2 objects 2  Language- repeat 1  Language- follow 3 step command 3  Language- read & follow direction 1  Write a sentence 1  Copy design 1  Total score 30        07/01/2023    1:19 PM  6CIT Screen  What Year? 0 points  What month? 0 points  What time? 0 points  Count back from 20 0 points  Months in reverse 0 points  Repeat phrase 2 points  Total Score 2 points    Immunizations Immunization History  Administered Date(s) Administered   Fluad Quad(high Dose 65+) 06/02/2022   Influenza,inj,Quad PF,6+ Mos 04/24/2015, 04/28/2018   Influenza-Unspecified 05/23/2013, 04/19/2023   PFIZER(Purple Top)SARS-COV-2 Vaccination 07/19/2020   PNEUMOCOCCAL CONJUGATE-20 06/23/2022   PPD Test 06/02/2022, 06/13/2022   Pfizer Covid-19 Vaccine Bivalent Booster 78yrs & up 06/02/2022   Tdap 11/21/2013    TDAP status: Up to date  Flu Vaccine status: Up to  date  Pneumococcal vaccine status: Up to date  Covid-19 vaccine status: Information provided on how to obtain vaccines.   Qualifies for Shingles Vaccine? Yes   Zostavax completed No   Shingrix Completed?: No.    Education has been provided regarding the importance of this vaccine. Patient has been advised to call insurance company to determine out of pocket expense if they have not yet received this vaccine. Advised may also receive vaccine at local pharmacy or Health Dept. Verbalized acceptance and understanding.  Screening Tests Health Maintenance  Topic Date Due   OPHTHALMOLOGY EXAM  Never done   Diabetic kidney evaluation - Urine ACR  Never done   Zoster Vaccines- Shingrix (1 of 2) Never done   Fecal DNA (Cologuard)  Never done   HEMOGLOBIN A1C  06/19/2022   COVID-19 Vaccine (3 - Pfizer risk series) 06/30/2022   FOOT EXAM  02/26/2023   Diabetic kidney evaluation - eGFR measurement  06/24/2023   Medicare Annual Wellness (AWV)  06/24/2023   DTaP/Tdap/Td (2 - Td or Tdap) 11/22/2023   MAMMOGRAM  05/18/2025   Pneumonia Vaccine 64+ Years old  Completed   INFLUENZA VACCINE  Completed  DEXA SCAN  Completed   Hepatitis C Screening  Completed   HPV VACCINES  Aged Out   Colonoscopy  Discontinued    Health Maintenance  Health Maintenance Due  Topic Date Due   OPHTHALMOLOGY EXAM  Never done   Diabetic kidney evaluation - Urine ACR  Never done   Zoster Vaccines- Shingrix (1 of 2) Never done   Fecal DNA (Cologuard)  Never done   HEMOGLOBIN A1C  06/19/2022   COVID-19 Vaccine (3 - Pfizer risk series) 06/30/2022   FOOT EXAM  02/26/2023   Diabetic kidney evaluation - eGFR measurement  06/24/2023   Medicare Annual Wellness (AWV)  06/24/2023    Colorectal cancer screening: Type of screening: Colonoscopy. Completed 2021 [facility]. Repeat every 10 years  Mammogram status: Completed 05/19/23. Repeat every year  Bone Density status: Completed 09/05/21. Results reflect: Bone density  results: OSTEOPENIA. Repeat every 2 years.  Lung Cancer Screening: (Low Dose CT Chest recommended if Age 8-80 years, 20 pack-year currently smoking OR have quit w/in 15years.) does not qualify.    Additional Screening:  Hepatitis C Screening: does qualify; Completed 12/12/21  Vision Screening: Recommended annual ophthalmology exams for early detection of glaucoma and other disorders of the eye. Is the patient up to date with their annual eye exam?  Yes  Who is the provider or what is the name of the office in which the patient attends annual eye exams? Dr. Candyce Churn If pt is not established with a provider, would they like to be referred to a provider to establish care? No .   Dental Screening: Recommended annual dental exams for proper oral hygiene  Diabetic Foot Exam: Diabetic Foot Exam: Overdue, Pt has been advised about the importance in completing this exam. Pt is scheduled for diabetic foot exam on N/a.  Community Resource Referral / Chronic Care Management: CRR required this visit?  No   CCM required this visit?  No     Plan:     I have personally reviewed and noted the following in the patient's chart:   Medical and social history Use of alcohol, tobacco or illicit drugs  Current medications and supplements including opioid prescriptions. Patient is not currently taking opioid prescriptions. Functional ability and status Nutritional status Physical activity Advanced directives List of other physicians Hospitalizations, surgeries, and ER visits in previous 12 months Vitals Screenings to include cognitive, depression, and falls Referrals and appointments  In addition, I have reviewed and discussed with patient certain preventive protocols, quality metrics, and best practice recommendations. A written personalized care plan for preventive services as well as general preventive health recommendations were provided to patient.     Donne Anon,  CMA   07/01/2023   After Visit Summary: (MyChart) Due to this being a telephonic visit, the after visit summary with patients personalized plan was offered to patient via MyChart   Nurse Notes: None

## 2023-07-01 NOTE — Patient Instructions (Signed)
Tiffany Browning , Thank you for taking time to come for your Medicare Wellness Visit. I appreciate your ongoing commitment to your health goals. Please review the following plan we discussed and let me know if I can assist you in the future.     This is a list of the screening recommended for you and due dates:  Health Maintenance  Topic Date Due   Eye exam for diabetics  Never done   Yearly kidney health urinalysis for diabetes  Never done   Zoster (Shingles) Vaccine (1 of 2) Never done   Cologuard (Stool DNA test)  Never done   Hemoglobin A1C  06/19/2022   COVID-19 Vaccine (3 - Pfizer risk series) 06/30/2022   Complete foot exam   02/26/2023   Yearly kidney function blood test for diabetes  06/24/2023   DTaP/Tdap/Td vaccine (2 - Td or Tdap) 11/22/2023   Medicare Annual Wellness Visit  06/30/2024   Mammogram  05/18/2025   Pneumonia Vaccine  Completed   Flu Shot  Completed   DEXA scan (bone density measurement)  Completed   Hepatitis C Screening  Completed   HPV Vaccine  Aged Out   Colon Cancer Screening  Discontinued     Next appointment: Follow up in one year for your annual wellness visit    Preventive Care 65 Years and Older, Female Preventive care refers to lifestyle choices and visits with your health care provider that can promote health and wellness. What does preventive care include? A yearly physical exam. This is also called an annual well check. Dental exams once or twice a year. Routine eye exams. Ask your health care provider how often you should have your eyes checked. Personal lifestyle choices, including: Daily care of your teeth and gums. Regular physical activity. Eating a healthy diet. Avoiding tobacco and drug use. Limiting alcohol use. Practicing safe sex. Taking low-dose aspirin every day. Taking vitamin and mineral supplements as recommended by your health care provider. What happens during an annual well check? The services and screenings done by  your health care provider during your annual well check will depend on your age, overall health, lifestyle risk factors, and family history of disease. Counseling  Your health care provider may ask you questions about your: Alcohol use. Tobacco use. Drug use. Emotional well-being. Home and relationship well-being. Sexual activity. Eating habits. History of falls. Memory and ability to understand (cognition). Work and work Astronomer. Reproductive health. Screening  You may have the following tests or measurements: Height, weight, and BMI. Blood pressure. Lipid and cholesterol levels. These may be checked every 5 years, or more frequently if you are over 6 years old. Skin check. Lung cancer screening. You may have this screening every year starting at age 65 if you have a 30-pack-year history of smoking and currently smoke or have quit within the past 15 years. Fecal occult blood test (FOBT) of the stool. You may have this test every year starting at age 56. Flexible sigmoidoscopy or colonoscopy. You may have a sigmoidoscopy every 5 years or a colonoscopy every 10 years starting at age 16. Hepatitis C blood test. Hepatitis B blood test. Sexually transmitted disease (STD) testing. Diabetes screening. This is done by checking your blood sugar (glucose) after you have not eaten for a while (fasting). You may have this done every 1-3 years. Bone density scan. This is done to screen for osteoporosis. You may have this done starting at age 44. Mammogram. This may be done every 1-2 years. Talk  to your health care provider about how often you should have regular mammograms. Talk with your health care provider about your test results, treatment options, and if necessary, the need for more tests. Vaccines  Your health care provider may recommend certain vaccines, such as: Influenza vaccine. This is recommended every year. Tetanus, diphtheria, and acellular pertussis (Tdap, Td) vaccine. You  may need a Td booster every 10 years. Zoster vaccine. You may need this after age 12. Pneumococcal 13-valent conjugate (PCV13) vaccine. One dose is recommended after age 43. Pneumococcal polysaccharide (PPSV23) vaccine. One dose is recommended after age 23. Talk to your health care provider about which screenings and vaccines you need and how often you need them. This information is not intended to replace advice given to you by your health care provider. Make sure you discuss any questions you have with your health care provider. Document Released: 08/30/2015 Document Revised: 04/22/2016 Document Reviewed: 06/04/2015 Elsevier Interactive Patient Education  2017 [employer].  Fall Prevention in the Home Falls can cause injuries. They can happen to people of all ages. There are many things you can do to make your home safe and to help prevent falls. What can I do on the outside of my home? Regularly fix the edges of walkways and driveways and fix any cracks. Remove anything that might make you trip as you walk through a door, such as a raised step or threshold. Trim any bushes or trees on the path to your home. Use bright outdoor lighting. Clear any walking paths of anything that might make someone trip, such as rocks or tools. Regularly check to see if handrails are loose or broken. Make sure that both sides of any steps have handrails. Any raised decks and porches should have guardrails on the edges. Have any leaves, snow, or ice cleared regularly. Use sand or salt on walking paths during winter. Clean up any spills in your garage right away. This includes oil or grease spills. What can I do in the bathroom? Use night lights. Install grab bars by the toilet and in the tub and shower. Do not use towel bars as grab bars. Use non-skid mats or decals in the tub or shower. If you need to sit down in the shower, use a plastic, non-slip stool. Keep the floor dry. Clean up any water that spills  on the floor as soon as it happens. Remove soap buildup in the tub or shower regularly. Attach bath mats securely with double-sided non-slip rug tape. Do not have throw rugs and other things on the floor that can make you trip. What can I do in the bedroom? Use night lights. Make sure that you have a light by your bed that is easy to reach. Do not use any sheets or blankets that are too big for your bed. They should not hang down onto the floor. Have a firm chair that has side arms. You can use this for support while you get dressed. Do not have throw rugs and other things on the floor that can make you trip. What can I do in the kitchen? Clean up any spills right away. Avoid walking on wet floors. Keep items that you use a lot in easy-to-reach places. If you need to reach something above you, use a strong step stool that has a grab bar. Keep electrical cords out of the way. Do not use floor polish or wax that makes floors slippery. If you must use wax, use non-skid floor wax. Do  not have throw rugs and other things on the floor that can make you trip. What can I do with my stairs? Do not leave any items on the stairs. Make sure that there are handrails on both sides of the stairs and use them. Fix handrails that are broken or loose. Make sure that handrails are as long as the stairways. Check any carpeting to make sure that it is firmly attached to the stairs. Fix any carpet that is loose or worn. Avoid having throw rugs at the top or bottom of the stairs. If you do have throw rugs, attach them to the floor with carpet tape. Make sure that you have a light switch at the top of the stairs and the bottom of the stairs. If you do not have them, ask someone to add them for you. What else can I do to help prevent falls? Wear shoes that: Do not have high heels. Have rubber bottoms. Are comfortable and fit you well. Are closed at the toe. Do not wear sandals. If you use a stepladder: Make  sure that it is fully opened. Do not climb a closed stepladder. Make sure that both sides of the stepladder are locked into place. Ask someone to hold it for you, if possible. Clearly mark and make sure that you can see: Any grab bars or handrails. First and last steps. Where the edge of each step is. Use tools that help you move around (mobility aids) if they are needed. These include: Canes. Walkers. Scooters. Crutches. Turn on the lights when you go into a dark area. Replace any light bulbs as soon as they burn out. Set up your furniture so you have a clear path. Avoid moving your furniture around. If any of your floors are uneven, fix them. If there are any pets around you, be aware of where they are. Review your medicines with your doctor. Some medicines can make you feel dizzy. This can increase your chance of falling. Ask your doctor what other things that you can do to help prevent falls. This information is not intended to replace advice given to you by your health care provider. Make sure you discuss any questions you have with your health care provider. Document Released: 05/30/2009 Document Revised: 01/09/2016 Document Reviewed: 09/07/2014 Elsevier Interactive Patient Education  2017 [employer].

## 2023-07-06 ENCOUNTER — Encounter: Payer: Medicare Other | Admitting: Family Medicine

## 2023-07-12 DIAGNOSIS — H35353 Cystoid macular degeneration, bilateral: Secondary | ICD-10-CM | POA: Diagnosis not present

## 2023-07-12 DIAGNOSIS — H43813 Vitreous degeneration, bilateral: Secondary | ICD-10-CM | POA: Diagnosis not present

## 2023-07-12 DIAGNOSIS — E1042 Type 1 diabetes mellitus with diabetic polyneuropathy: Secondary | ICD-10-CM | POA: Diagnosis not present

## 2023-07-12 DIAGNOSIS — E119 Type 2 diabetes mellitus without complications: Secondary | ICD-10-CM | POA: Diagnosis not present

## 2023-07-12 DIAGNOSIS — E113212 Type 2 diabetes mellitus with mild nonproliferative diabetic retinopathy with macular edema, left eye: Secondary | ICD-10-CM | POA: Diagnosis not present

## 2023-07-12 LAB — HM DIABETES EYE EXAM

## 2023-07-27 ENCOUNTER — Ambulatory Visit (INDEPENDENT_AMBULATORY_CARE_PROVIDER_SITE_OTHER): Payer: Medicare Other | Admitting: Family Medicine

## 2023-07-27 ENCOUNTER — Encounter: Payer: Self-pay | Admitting: Family Medicine

## 2023-07-27 VITALS — BP 122/78 | HR 57 | Temp 97.6°F | Resp 16 | Ht 61.0 in | Wt 132.8 lb

## 2023-07-27 DIAGNOSIS — J4520 Mild intermittent asthma, uncomplicated: Secondary | ICD-10-CM | POA: Diagnosis not present

## 2023-07-27 DIAGNOSIS — Z0000 Encounter for general adult medical examination without abnormal findings: Secondary | ICD-10-CM

## 2023-07-27 DIAGNOSIS — D649 Anemia, unspecified: Secondary | ICD-10-CM

## 2023-07-27 DIAGNOSIS — E785 Hyperlipidemia, unspecified: Secondary | ICD-10-CM | POA: Diagnosis not present

## 2023-07-27 DIAGNOSIS — E039 Hypothyroidism, unspecified: Secondary | ICD-10-CM

## 2023-07-27 DIAGNOSIS — E1165 Type 2 diabetes mellitus with hyperglycemia: Secondary | ICD-10-CM | POA: Diagnosis not present

## 2023-07-27 LAB — COMPREHENSIVE METABOLIC PANEL
ALT: 50 U/L — ABNORMAL HIGH (ref 0–35)
AST: 44 U/L — ABNORMAL HIGH (ref 0–37)
Albumin: 4.4 g/dL (ref 3.5–5.2)
Alkaline Phosphatase: 125 U/L — ABNORMAL HIGH (ref 39–117)
BUN: 15 mg/dL (ref 6–23)
CO2: 27 meq/L (ref 19–32)
Calcium: 9.4 mg/dL (ref 8.4–10.5)
Chloride: 105 meq/L (ref 96–112)
Creatinine, Ser: 0.78 mg/dL (ref 0.40–1.20)
GFR: 79.29 mL/min (ref >60.00)
Glucose, Bld: 110 mg/dL — ABNORMAL HIGH (ref 70–99)
Potassium: 4.8 meq/L (ref 3.5–5.1)
Sodium: 141 meq/L (ref 135–145)
Total Bilirubin: 0.4 mg/dL (ref 0.2–1.2)
Total Protein: 7.2 g/dL (ref 6.0–8.3)

## 2023-07-27 LAB — LIPID PANEL
Cholesterol: 170 mg/dL (ref 0–200)
HDL: 57.2 mg/dL (ref >39.00)
LDL Cholesterol: 98 mg/dL (ref 0–99)
NonHDL: 112.82
Total CHOL/HDL Ratio: 3
Triglycerides: 76 mg/dL (ref 0.0–149.0)
VLDL: 15.2 mg/dL (ref 0.0–40.0)

## 2023-07-27 LAB — TSH: TSH: 0.9 u[IU]/mL (ref 0.35–5.50)

## 2023-07-27 LAB — CBC WITH DIFFERENTIAL/PLATELET
Basophils Absolute: 0.1 10*3/uL (ref 0.0–0.1)
Basophils Relative: 1 % (ref 0.0–3.0)
Eosinophils Absolute: 0.3 10*3/uL (ref 0.0–0.7)
Eosinophils Relative: 4.6 % (ref 0.0–5.0)
HCT: 37.7 % (ref 36.0–46.0)
Hemoglobin: 12.4 g/dL (ref 12.0–15.0)
Lymphocytes Relative: 22.3 % (ref 12.0–46.0)
Lymphs Abs: 1.2 10*3/uL (ref 0.7–4.0)
MCHC: 32.9 g/dL (ref 30.0–36.0)
MCV: 94.3 fL (ref 78.0–100.0)
Monocytes Absolute: 0.5 10*3/uL (ref 0.1–1.0)
Monocytes Relative: 8.2 % (ref 3.0–12.0)
Neutro Abs: 3.5 10*3/uL (ref 1.4–7.7)
Neutrophils Relative %: 63.9 % (ref 43.0–77.0)
Platelets: 349 10*3/uL (ref 150.0–400.0)
RBC: 4 Mil/uL (ref 3.87–5.11)
RDW: 12.3 % (ref 11.5–15.5)
WBC: 5.5 10*3/uL (ref 4.0–10.5)

## 2023-07-27 LAB — VITAMIN D 25 HYDROXY (VIT D DEFICIENCY, FRACTURES): VITD: 55.23 ng/mL (ref 30.00–100.00)

## 2023-07-27 LAB — VITAMIN B12: Vitamin B-12: 904 pg/mL (ref 211–911)

## 2023-07-27 LAB — HEMOGLOBIN A1C: Hgb A1c MFr Bld: 7.7 % — ABNORMAL HIGH (ref 4.6–6.5)

## 2023-07-27 NOTE — Assessment & Plan Note (Signed)
Encourage heart healthy diet such as MIND or DASH diet, increase exercise, avoid trans fats, simple carbohydrates and processed foods, consider a krill or fish or flaxseed oil cap daily.  °

## 2023-07-27 NOTE — Assessment & Plan Note (Signed)
Check labs 

## 2023-07-27 NOTE — Progress Notes (Signed)
Established Patient Office Visit  Subjective   Patient ID: Tiffany Browning, female    DOB: [birth year]  Age: 67 y.o. MRN: [id number]  Chief Complaint  Patient presents with   Annual Exam    Pt states fasting     HPI Discussed the use of AI scribe software for clinical note transcription with the patient, who gave verbal consent to proceed.  History of Present Illness   The patient, with a history of type 1 diabetes, presents with concerns about weight management and iron deficiency. She reports difficulty losing weight despite regular exercise and a consultation with an endocrinologist. The patient also mentions a recent unsuccessful attempt to donate blood due to low iron levels (10.4), despite taking over-the-counter iron supplements and consuming iron-rich foods. She expresses interest in exploring other options to increase iron levels.  The patient also reports a recent issue with earwax buildup, which she believes is affecting her hearing. She describes a sensation of moving the wax around to improve hearing.  Additionally, the patient mentions her spouse's recent back surgery and the subsequent recovery process. She notes that her spouse's post-operative pain has been significant, leading to disrupted sleep for both of them. The surgery was performed by Dr. Dutch Quint and involved extensive work to remove arthritis. The patient's spouse is reportedly improving but still experiencing some discomfort.  The patient is otherwise in good health, with no changes in family history or other medical conditions. She maintains an active lifestyle, including regular workouts with a Systems analyst.      Patient Active Problem List   Diagnosis Date Noted   Preventative health care 06/23/2022   Mild intermittent asthma 04/04/2021   Other fatigue 04/04/2021   Hair loss 04/04/2021   Trigger finger of right thumb 09/13/2019   Bilateral carpal tunnel syndrome 03/01/2019   Trigger finger 07/27/2018    Acquired trigger finger of left ring finger 03/14/2018   Medial epicondylitis 03/14/2018   Pain in finger of left hand 03/14/2018   Long term (current) use of insulin (HCC) 01/11/2018   Encounter for general adult medical examination without abnormal findings 01/07/2017   Pansinusitis 12/21/2016   Bronchitis 10/31/2016   Acute urticaria 08/07/2016   History of therapeutic radiation 06/30/2016   Family history of breast cancer    Family history of kidney cancer    Breast cancer of upper-outer quadrant of right female breast (HCC) 01/28/2016   Sinusitis, acute maxillary 07/11/2014   Wheezing 07/11/2014   Hyperlipemia 03/13/2013   Systolic murmur 03/13/2013   VAGINITIS, CANDIDAL 02/06/2010   VAGINITIS, BACTERIAL 02/06/2010   Asthma without status asthmaticus 09/30/2009   DYSURIA 09/20/2007   Hypothyroidism 02/21/2007   Type 1 diabetes mellitus (HCC) 02/21/2007   EXTERNAL OTITIS 02/21/2007   ALLERGIC RHINITIS 02/21/2007   ASTHMA 02/21/2007   Past Medical History:  Diagnosis Date   Allergy    Asthma    under control   Breast cancer (HCC)    Diabetes mellitus    Type 1-on insulin pump   Family history of breast cancer    Family history of kidney cancer    Heart murmur    Hypothyroidism    Personal history of radiation therapy    Thyroid disease    Hypothyroidism   Past Surgical History:  Procedure Laterality Date   ABDOMINAL HYSTERECTOMY     APPENDECTOMY     BREAST BIOPSY Right 02/14/2016   BREAST BIOPSY Right 01/09/2016   BREAST LUMPECTOMY Right 03/26/2016   BREAST LUMPECTOMY  WITH AXILLARY LYMPH NODE BIOPSY Right 03/26/2016   Procedure: RIGHT BREAST LUMPECTOMY WITH AXILLARY LYMPH NODE BIOPSY;  Surgeon: Chevis Pretty III, MD;  Location: [facility] OR;  Service: General;  Laterality: Right;   CARPAL TUNNEL RELEASE Right 02/09/2019   Procedure: RIGHT CARPAL TUNNEL RELEASE;  Surgeon: Cindee Salt, MD;  Location: [facility];  Service: Orthopedics;  Laterality: Right;    CARPAL TUNNEL RELEASE Left 02/05/2022   Procedure: CARPAL TUNNEL RELEASE;  Surgeon: Betha Loa, MD;  Location: [facility];  Service: Orthopedics;  Laterality: Left;  30 MIN   NASAL SINUS SURGERY     ruptured ovarian cyst     SHOULDER ARTHROSCOPY  2004   b/L --second one 2011   TONSILECTOMY, ADENOIDECTOMY, BILATERAL MYRINGOTOMY AND TUBES     TRIGGER FINGER RELEASE Right 07/04/2020   Procedure: RELEASE TRIGGER FINGER/A-1 PULLEY;  Surgeon: Cindee Salt, MD;  Location: [facility];  Service: Orthopedics;  Laterality: Right;  IV REGIONAL FOREARM BLOCK   TUBAL LIGATION     then reversed it   Social History   Tobacco Use   Smoking status: Never   Smokeless tobacco: Never  Vaping Use   Vaping status: Never Used  Substance Use Topics   Alcohol use: Yes    Comment: occ   Drug use: No   Social History   Socioeconomic History   Marital status: Married    Spouse name: Tommy   Number of children: 0   Years of education: Not on file   Highest education level: Not on file  Occupational History   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  Tobacco Use   Smoking status: Never   Smokeless tobacco: Never  Vaping Use   Vaping status: Never Used  Substance and Sexual Activity   Alcohol use: Yes    Comment: occ   Drug use: No   Sexual activity: Yes    Partners: Male  Other Topics Concern   Not on file  Social History Narrative   Not on file   Social Determinants of Health   Financial Resource Strain: Low Risk  (06/29/2023)   Overall Financial Resource Strain (CARDIA)    Difficulty of Paying Living Expenses: Not hard at all  Food Insecurity: No Food Insecurity (06/29/2023)   Hunger Vital Sign    Worried About Running Out of Food in the Last Year: Never true    Ran Out of Food in the Last Year: Never true  Transportation Needs: No Transportation Needs (06/29/2023)   PRAPARE - Administrator, Civil Service (Medical): No    Lack of  Transportation (Non-Medical): No  Physical Activity: Sufficiently Active (06/29/2023)   Exercise Vital Sign    Days of Exercise per Week: 3 days    Minutes of Exercise per Session: 60 min  Stress: No Stress Concern Present (06/29/2023)   [employer] of Occupational Health - Occupational Stress Questionnaire    Feeling of Stress : Only a little  Social Connections: Unknown (06/29/2023)   Social Connection and Isolation Panel [NHANES]    Frequency of Communication with Friends and Family: Once a week    Frequency of Social Gatherings with Friends and Family: Once a week    Attends Religious Services: Not on file    Active Member of Clubs or Organizations: Yes    Attends Banker Meetings: 1 to 4 times per year    Marital Status: Married  Intimate Partner Violence: Not At Risk (07/01/2023)  Humiliation, Afraid, Rape, and Kick questionnaire    Fear of Current or Ex-Partner: No    Emotionally Abused: No    Physically Abused: No    Sexually Abused: No   Family Status  Relation Name Status   Mother SUDA WILLS Deceased at age 11       pneumonia   Father Francee Gentile Deceased   Sister robin Alive   Brother Jyla Rhead Alive   Sister debbie Alive   Mat Aunt x4 Deceased   Mat Uncle x4 Deceased   Pat Uncle x2 Deceased   MGM  Deceased at age 72   MGF  Deceased       suicide   PGM  Deceased   PGF  Deceased   Other Nephew Alive  No partnership data on file   Family History  Problem Relation Age of Onset   Cancer Mother        cervical   Hypertension Mother    Hyperlipidemia Mother    Arthritis Mother    Heart disease Father 11       MI---- cabg before that   Alcohol abuse Father    Hypothyroidism Sister    Breast cancer Sister 85   Cancer Sister    Heart disease Brother 38       MI--- second one 39   Hyperlipidemia Brother    Hypertension Brother    Hypothyroidism Sister    Kidney cancer Other 16       son of her sister with breast  cancer   No Known Allergies    Review of Systems  Constitutional:  Negative for chills, fever and malaise/fatigue.  HENT:  Negative for congestion and hearing loss.   Eyes:  Negative for blurred vision and discharge.  Respiratory:  Negative for cough, sputum production and shortness of breath.   Cardiovascular:  Negative for chest pain, palpitations and leg swelling.  Gastrointestinal:  Negative for abdominal pain, blood in stool, constipation, diarrhea, heartburn, nausea and vomiting.  Genitourinary:  Negative for dysuria, frequency, hematuria and urgency.  Musculoskeletal:  Negative for back pain, falls and myalgias.  Skin:  Negative for rash.  Neurological:  Negative for dizziness, sensory change, loss of consciousness, weakness and headaches.  Endo/Heme/Allergies:  Negative for environmental allergies. Does not bruise/bleed easily.  Psychiatric/Behavioral:  Negative for depression and suicidal ideas. The patient is not nervous/anxious and does not have insomnia.       Objective:     BP 122/78 (BP Location: Left Arm, Patient Position: Sitting, Cuff Size: Normal)   Pulse (!) 57   Temp 97.6 F (36.4 C) (Oral)   Resp 16   Ht 5\' 1"  (1.549 m)   Wt 132 lb 12.8 oz (60.2 kg)   SpO2 100%   BMI 25.09 kg/m  BP Readings from Last 3 Encounters:  07/27/23 122/78  06/23/22 108/68  02/05/22 (!) 153/56   Wt Readings from Last 3 Encounters:  07/27/23 132 lb 12.8 oz (60.2 kg)  06/23/22 133 lb 6.4 oz (60.5 kg)  02/05/22 132 lb 7.9 oz (60.1 kg)   SpO2 Readings from Last 3 Encounters:  07/27/23 100%  06/23/22 97%  02/05/22 93%      Physical Exam Vitals and nursing note reviewed.  Constitutional:      General: She is not in acute distress.    Appearance: Normal appearance. She is well-developed.  HENT:     Head: Normocephalic and atraumatic.  Eyes:     General:  No scleral icterus.       Right eye: No discharge.        Left eye: No discharge.  Cardiovascular:     Rate and  Rhythm: Normal rate and regular rhythm.     Heart sounds: No murmur heard. Pulmonary:     Effort: Pulmonary effort is normal. No respiratory distress.     Breath sounds: Normal breath sounds.  Musculoskeletal:        General: Normal range of motion.     Cervical back: Normal range of motion and neck supple.     Right lower leg: No edema.     Left lower leg: No edema.  Skin:    General: Skin is warm and dry.  Neurological:     Mental Status: She is alert and oriented to person, place, and time.  Psychiatric:        Mood and Affect: Mood normal.        Behavior: Behavior normal.        Thought Content: Thought content normal.        Judgment: Judgment normal.      No results found for any visits on 07/27/23.    The 10-year ASCVD risk score (Arnett DK, et al., 2019) is: 9.5%    Assessment & Plan:   Problem List Items Addressed This Visit       Unprioritized   Mild intermittent asthma   Preventative health care - Primary    Ghm utd Check labs  See AVS Health Maintenance  Topic Date Due   OPHTHALMOLOGY EXAM  Never done   Diabetic kidney evaluation - Urine ACR  Never done   Zoster Vaccines- Shingrix (1 of 2) Never done   Fecal DNA (Cologuard)  Never done   HEMOGLOBIN A1C  06/19/2022   COVID-19 Vaccine (3 - Pfizer risk series) 06/30/2022   FOOT EXAM  02/26/2023   Diabetic kidney evaluation - eGFR measurement  06/24/2023   DTaP/Tdap/Td (2 - Td or Tdap) 11/22/2023   Medicare Annual Wellness (AWV)  06/30/2024   MAMMOGRAM  05/18/2025   Pneumonia Vaccine 51+ Years old  Completed   INFLUENZA VACCINE  Completed   DEXA SCAN  Completed   Hepatitis C Screening  Completed   HPV VACCINES  Aged Out   Colonoscopy  Discontinued         Hypothyroidism    Check labs       Relevant Orders   TSH   Hyperlipemia    Encourage heart healthy diet such as MIND or DASH diet, increase exercise, avoid trans fats, simple carbohydrates and processed foods, consider a krill or fish  or flaxseed oil cap daily.        Relevant Orders   Lipid panel   CBC with Differential/Platelet   Comprehensive metabolic panel   Other Visit Diagnoses     Type 2 diabetes mellitus with hyperglycemia, without long-term current use of insulin (HCC)       Relevant Orders   Hemoglobin A1c   Microalbumin / creatinine urine ratio   Iron, TIBC and Ferritin Panel   Anemia, unspecified type       Relevant Orders   CBC with Differential/Platelet   Iron, TIBC and Ferritin Panel   Vitamin B12   VITAMIN D 25 Hydroxy (Vit-D Deficiency, Fractures)       No follow-ups on file.    Donato Schultz, DO

## 2023-07-27 NOTE — Assessment & Plan Note (Signed)
Ghm utd Check labs  See AVS Health Maintenance  Topic Date Due   OPHTHALMOLOGY EXAM  Never done   Diabetic kidney evaluation - Urine ACR  Never done   Zoster Vaccines- Shingrix (1 of 2) Never done   Fecal DNA (Cologuard)  Never done   HEMOGLOBIN A1C  06/19/2022   COVID-19 Vaccine (3 - Pfizer risk series) 06/30/2022   FOOT EXAM  02/26/2023   Diabetic kidney evaluation - eGFR measurement  06/24/2023   DTaP/Tdap/Td (2 - Td or Tdap) 11/22/2023   Medicare Annual Wellness (AWV)  06/30/2024   MAMMOGRAM  05/18/2025   Pneumonia Vaccine 54+ Years old  Completed   INFLUENZA VACCINE  Completed   DEXA SCAN  Completed   Hepatitis C Screening  Completed   HPV VACCINES  Aged Out   Colonoscopy  Discontinued

## 2023-07-28 LAB — IRON,TIBC AND FERRITIN PANEL
%SAT: 50 % — ABNORMAL HIGH (ref 16–45)
Ferritin: 36 ng/mL (ref 16–288)
Iron: 156 ug/dL (ref 45–160)
TIBC: 313 ug/dL (ref 250–450)

## 2023-07-29 DIAGNOSIS — D649 Anemia, unspecified: Secondary | ICD-10-CM | POA: Diagnosis not present

## 2023-07-29 DIAGNOSIS — M8580 Other specified disorders of bone density and structure, unspecified site: Secondary | ICD-10-CM | POA: Diagnosis not present

## 2023-07-29 DIAGNOSIS — Z853 Personal history of malignant neoplasm of breast: Secondary | ICD-10-CM | POA: Diagnosis not present

## 2023-07-29 DIAGNOSIS — E1042 Type 1 diabetes mellitus with diabetic polyneuropathy: Secondary | ICD-10-CM | POA: Diagnosis not present

## 2023-08-03 ENCOUNTER — Ambulatory Visit: Payer: Self-pay

## 2023-08-03 NOTE — Patient Outreach (Signed)
  Care Coordination   Follow Up Visit Note   08/03/2023 Name: Tiffany Browning MRN: [id number] DOB: 1955-12-11  Tiffany Browning is a 67 y.o. year old female who sees Tiffany Browning, Tiffany Congress, DO for primary care. I spoke with  Tiffany Browning by phone today.  What matters to the patients health and wellness today?  Reports increase in thyroid medication has had a positive affect on her weight. Tiffany Browning reports she has lost 4 pounds in the past 2 months. PCP office visit 07/27/23. Diabetes type 1- managed by Tiffany Browning and office visit completed on 07/29/23. Uses freestyle libre. She is without questions or concerns at this time.  Goals      maintain or decrease weight by 5 pounds     Interventions Today    Flowsheet Row Most Recent Value  Chronic Disease   Chronic disease during today's visit Diabetes  General Interventions   General Interventions Discussed/Reviewed General Interventions Reviewed, Doctor Visits  [Evaluation of current treatment plan for health condition and patient's adherence to plan.]  Doctor Visits Discussed/Reviewed Doctor Visits Reviewed, Specialist  PCP/Specialist Visits Compliance with follow-up visit  [reviewed upcoming/scheduled provider visits.]  Exercise Interventions   Exercise Discussed/Reviewed Exercise Reviewed  Education Interventions   Education Provided Provided Education  [reviewed patient instructions per clinical pharmacist telephone visit on 05/19/23.]  Provided Verbal Education On Other, Blood Sugar Monitoring, Medication, When to see the doctor  [advised to continue to take medications as prescribed, attend provider visits as scheduled, check blood sugar as recommended by provider, eat healthy, contact provider with health questions or concerns.]  Nutrition Interventions   Nutrition Discussed/Reviewed Nutrition Reviewed  Pharmacy Interventions   Pharmacy Dicussed/Reviewed Pharmacy Topics Reviewed            SDOH assessments and  interventions completed:  No  Care Coordination Interventions:  Yes, provided   Follow up plan: Follow up call scheduled for 09/01/23    Encounter Outcome:  Patient Visit Completed   Tiffany Sheriff, RN, MSN, BSN, CCM Care Management Coordinator [phone]

## 2023-08-03 NOTE — Patient Instructions (Addendum)
Visit Information  Thank you for taking time to visit with me today. Please don't hesitate to contact me if I can be of assistance to you.   Following are the goals we discussed today:  Continue to take medications as prescribed. Continue to attend provider visits as scheduled Continue to eat healthy, lean meats, vegetables, fruits, avoid saturated and transfats Contact provider with health questions or concerns as needed Continue to check blood sugar as recommended and notify provider if questions or concerns Check with your Endocrinologist to see what your Target BS range is  Our next appointment is by telephone on 09/01/23 at 10:00 am  Please call the care guide team at [phone] if you need to cancel or reschedule your appointment.   If you are experiencing a Mental Health or Behavioral Health Crisis or need someone to talk to, please call the Suicide and Crisis Lifeline: 988 call the [place] National Suicide Prevention Lifeline: (941)355-4254 or TTY: 581-715-3269 TTY [phone]) to talk to a trained counselor  Kathyrn Sheriff, RN, MSN, BSN, CCM Care Management Coordinator [phone]

## 2023-08-04 ENCOUNTER — Other Ambulatory Visit: Payer: Self-pay | Admitting: Family Medicine

## 2023-08-10 ENCOUNTER — Telehealth: Payer: Medicare Other | Admitting: Family

## 2023-08-10 ENCOUNTER — Encounter: Payer: Self-pay | Admitting: Family

## 2023-08-10 ENCOUNTER — Telehealth: Payer: Self-pay

## 2023-08-10 VITALS — Temp 99.0°F | Ht 61.0 in

## 2023-08-10 DIAGNOSIS — U071 COVID-19: Secondary | ICD-10-CM | POA: Diagnosis not present

## 2023-08-10 MED ORDER — NIRMATRELVIR/RITONAVIR (PAXLOVID)TABLET: 3.0000 | ORAL_TABLET | Freq: Two times a day (BID) | ORAL | 0 refills | Status: AC

## 2023-08-10 NOTE — Telephone Encounter (Signed)
Copied from CRM [id number]. Topic: Clinical - Lab/Test Results >> Aug 10, 2023 11:38 AM [place] A wrote: Reason for CRM: Patient called in because she took an at home Covid test and it was Positive. Patient would like for Paxlovid to be prescribed

## 2023-08-10 NOTE — Telephone Encounter (Signed)
Needs appt please

## 2023-08-10 NOTE — Telephone Encounter (Signed)
Pt called and scheduled for VV

## 2023-08-10 NOTE — Progress Notes (Signed)
Tiffany Browning is a 67 y.o. female with the following history as recorded in [employer]:  Patient Active Problem List   Diagnosis Date Noted   Preventative health care 06/23/2022   Mild intermittent asthma 04/04/2021   Other fatigue 04/04/2021   Hair loss 04/04/2021   Trigger finger of right thumb 09/13/2019   Bilateral carpal tunnel syndrome 03/01/2019   Trigger finger 07/27/2018   Acquired trigger finger of left ring finger 03/14/2018   Medial epicondylitis 03/14/2018   Pain in finger of left hand 03/14/2018   Long term (current) use of insulin (HCC) 01/11/2018   Encounter for general adult medical examination without abnormal findings 01/07/2017   Pansinusitis 12/21/2016   Bronchitis 10/31/2016   Acute urticaria 08/07/2016   History of therapeutic radiation 06/30/2016   Family history of breast cancer    Family history of kidney cancer    Breast 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     Calcium Carb-Cholecalciferol (CALTRATE 600+D3) 600-800 MG-UNIT TABS      Calcium-Magnesium-Vitamin D (CALCIUM MAGNESIUM PO) Take 1 tablet by mouth daily.     fish oil-omega-3 fatty acids 1000 MG capsule Take 2 g by mouth daily.     Fluticasone Furoate (ARNUITY ELLIPTA) 50 MCG/ACT AEPB USE 1 INHALATION BY MOUTH DAILY 90 each 1   glucose blood (FREESTYLE LITE) test strip Check blood sugar 6 times a day 600 each 3   Insulin Lispro-aabc  (LYUMJEV) 100 UNIT/ML SOLN Inject up to 35 Units as directed with insulin pump 40 mL 3   levothyroxine (SYNTHROID) 175 MCG tablet Take 175 mcg by mouth daily before breakfast.     montelukast (SINGULAIR) 10 MG tablet TAKE 1 TABLET BY MOUTH AT  BEDTIME PT DUE FOR OFFICE VISIT 90 tablet 0   Multiple Vitamin (MULTIVITAMIN) tablet Take 1 tablet by mouth daily.     nirmatrelvir/ritonavir (PAXLOVID) 20 x 150 MG & 10 x 100MG  TABS Take 3 tablets by mouth 2 (two) times daily for 5 days. (Take nirmatrelvir 150 mg two tablets twice daily for 5 days and ritonavir 100 mg one tablet twice daily for 5 days) Patient GFR is 79 30 tablet 0   rosuvastatin (CRESTOR) 5 MG tablet TAKE 1 TABLET BY MOUTH DAILY 90 tablet 1   No current facility-administered medications for this visit.    Allergies: Patient has no known allergies.  Past Medical History:  Diagnosis Date   Allergy    Asthma    under control   Breast cancer (HCC)    Diabetes mellitus    Type 1-on insulin pump   Family history of breast cancer    Family history of kidney cancer    Heart murmur    Hypothyroidism    Personal history of radiation therapy    Thyroid disease    Hypothyroidism    Past Surgical History:  Procedure Laterality Date   ABDOMINAL HYSTERECTOMY     APPENDECTOMY     BREAST BIOPSY Right  02/14/2016   BREAST BIOPSY Right 01/09/2016   BREAST LUMPECTOMY Right 03/26/2016   BREAST LUMPECTOMY WITH AXILLARY LYMPH NODE BIOPSY Right 03/26/2016   Procedure: RIGHT BREAST LUMPECTOMY WITH AXILLARY LYMPH NODE BIOPSY;  Surgeon: Chevis Pretty III, MD;  Location: [facility] OR;  Service: General;  Laterality: Right;   CARPAL TUNNEL RELEASE Right 02/09/2019   Procedure: RIGHT CARPAL TUNNEL RELEASE;  Surgeon: Cindee Salt, MD;  Location: [facility];  Service: Orthopedics;  Laterality: Right;   CARPAL TUNNEL RELEASE Left 02/05/2022   Procedure: CARPAL TUNNEL RELEASE;  Surgeon: Betha Loa, MD;  Location: [facility];  Service:  Orthopedics;  Laterality: Left;  30 MIN   NASAL SINUS SURGERY     ruptured ovarian cyst     SHOULDER ARTHROSCOPY  2004   b/L --second one 2011   TONSILECTOMY, ADENOIDECTOMY, BILATERAL MYRINGOTOMY AND TUBES     TRIGGER FINGER RELEASE Right 07/04/2020   Procedure: RELEASE TRIGGER FINGER/A-1 PULLEY;  Surgeon: Cindee Salt, MD;  Location: [facility];  Service: Orthopedics;  Laterality: Right;  IV REGIONAL FOREARM BLOCK   TUBAL LIGATION     then reversed it    Family History  Problem Relation Age of Onset   Cancer Mother        cervical   Hypertension Mother    Hyperlipidemia Mother    Arthritis Mother    Heart disease Father 55       MI---- cabg before that   Alcohol abuse Father    Hypothyroidism Sister    Breast cancer Sister 70   Cancer Sister    Heart disease Brother 33       MI--- second one 19   Hyperlipidemia Brother    Hypertension Brother    Hypothyroidism Sister    Kidney cancer Other 27       son of her sister with breast cancer    Social History   Tobacco Use   Smoking status: Never   Smokeless tobacco: Never  Substance Use Topics   Alcohol use: Yes    Comment: occ    Subjective:    I connected with [name] on 08/10/23 at 12:20 PM EST by a video enabled telemedicine application and verified that I am speaking with the correct person using two identifiers.   I discussed the limitations of evaluation and management by telemedicine and the availability of in person appointments. The patient expressed understanding and agreed to proceed. Provider in office/ patient is at home; provider and patient are only 2 people on video call.   Tested positive for COVID this morning; symptoms started yesterday; requesting Rx for Paxlovid; renal functions were checked earlier this month; notes that sister has also tested positive for COVID and they were together on Saturday for a family gathering; no chest pain, no shortness of breath;   Objective:   Vitals:   08/10/23 1211  Temp: 99 F (37.2 C)  TempSrc: Oral  Height: 5\' 1"  (1.549 m)    General: Well developed, well nourished, in no acute distress  Skin : Warm and dry.  Head: Normocephalic and atraumatic  Lungs: Respirations unlabored;  Neurologic: Alert and oriented; speech intact; face symmetrical;   Assessment:  1. COVID-19     Plan:  Symptomatic treatment discussed including need to rest and drink plenty of fluids; Rx for Paxlovid as requested- she understands to hold her Crestor for the next 7 days while on the medication; work restrictions discussed and she will call  back for work note if she does not feel that she can work her shift on Saturday; follow up worse, no better.   No follow-ups on file.  No orders of the defined types were placed in this encounter.   Requested Prescriptions   Signed Prescriptions Disp Refills   nirmatrelvir/ritonavir (PAXLOVID) 20 x 150 MG & 10 x 100MG  TABS 30 tablet 0    Sig: Take 3 tablets by mouth 2 (two) times daily for 5 days. (Take nirmatrelvir 150 mg two tablets twice daily for 5 days and ritonavir 100 mg one tablet twice daily for 5 days) Patient GFR is 79

## 2023-08-20 DIAGNOSIS — E1042 Type 1 diabetes mellitus with diabetic polyneuropathy: Secondary | ICD-10-CM | POA: Diagnosis not present

## 2023-09-01 ENCOUNTER — Ambulatory Visit: Payer: Self-pay

## 2023-09-01 NOTE — Patient Instructions (Signed)
 Visit Information  Thank you for taking time to visit with me today. Please don't hesitate to contact me if I can be of assistance to you.   Following are the goals we discussed today:  Continue to take medications as prescribed. Continue to attend provider visits as scheduled Continue to eat healthy, lean meats, vegetables, fruits, avoid saturated and transfats Contact provider with health questions or concerns as needed Continue to check blood sugar as recommended and notify provider if questions or concerns  If you are experiencing a Mental Health or Behavioral Health Crisis or need someone to talk to, please call the Suicide and Crisis Lifeline: 988 call the [place] National Suicide Prevention Lifeline: 909-499-9843 or TTY: (210) 652-9162 TTY (224)257-2379) to talk to a trained counselor   Kathyrn Sheriff, RN, MSN, BSN, CCM Care Management Coordinator [phone]

## 2023-09-01 NOTE — Patient Outreach (Signed)
  Care Coordination   Follow Up Visit Note   09/01/2023 Name: Tiffany Browning MRN: [id number] DOB: 06-Dec-1955  Tiffany Browning is a 68 y.o. year old female who sees Tiffany Browning, Tiffany Chalk, DO for primary care. I spoke with  Tiffany Browning by phone today.  What matters to the patients health and wellness today?  Tiffany Browning reports she is doing well. She states since provider increased her thyroid  medication, she has lost  weight and is keeping it off-4 pounds at this time. She states she is using CGM and denies any hypoglycemic events. She reports one low reading of 69. Tiffany Browning denies any questions/concerns regarding diabetes management. She continues to follow up with endocrinologist. Per patient, she has all her medications. She continues to remain active, In addition she is working. Tiffany Browning denies any care management needs at this time. No care management needs identified. Patient to contact RNCM or PCP if needed in the future.  Goals Addressed             This Visit's Progress    maintain or decrease weight by 5 pounds       Interventions Today    Flowsheet Row Most Recent Value  Chronic Disease   Chronic disease during today's visit Diabetes, Other  [hypothyroid]  General Interventions   General Interventions Discussed/Reviewed General Interventions Reviewed  [Evaluation of current treatment plan for health condition and patient's adherence to plan. reviewed medications,assessed hypoglyemic episoded. discussed blood sugar management. assess if any hypoglycemic concerns, discussed patient weight loss goals.]  Education Interventions   Education Provided Provided Education  Provided Verbal Education On When to see the doctor, Medication, Blood Sugar Monitoring  Pharmacy Interventions   Pharmacy Dicussed/Reviewed Pharmacy Topics Reviewed             SDOH assessments and interventions completed:  No  Care Coordination Interventions:  Yes, provided   Follow up plan:  No further intervention required.   Encounter Outcome:  Patient Visit Completed   Tiffany Revering, RN, MSN, BSN, CCM RN Case Manager [phone]

## 2023-10-08 ENCOUNTER — Other Ambulatory Visit: Payer: Self-pay | Admitting: Family Medicine

## 2023-10-10 DIAGNOSIS — E1042 Type 1 diabetes mellitus with diabetic polyneuropathy: Secondary | ICD-10-CM | POA: Diagnosis not present

## 2023-11-17 DIAGNOSIS — H43311 Vitreous membranes and strands, right eye: Secondary | ICD-10-CM | POA: Diagnosis not present

## 2023-11-17 DIAGNOSIS — E1042 Type 1 diabetes mellitus with diabetic polyneuropathy: Secondary | ICD-10-CM | POA: Diagnosis not present

## 2023-11-22 DIAGNOSIS — E1042 Type 1 diabetes mellitus with diabetic polyneuropathy: Secondary | ICD-10-CM | POA: Diagnosis not present

## 2023-11-22 DIAGNOSIS — E109 Type 1 diabetes mellitus without complications: Secondary | ICD-10-CM | POA: Diagnosis not present

## 2023-12-01 DIAGNOSIS — E039 Hypothyroidism, unspecified: Secondary | ICD-10-CM | POA: Diagnosis not present

## 2023-12-01 DIAGNOSIS — Z853 Personal history of malignant neoplasm of breast: Secondary | ICD-10-CM | POA: Diagnosis not present

## 2023-12-01 DIAGNOSIS — Z4681 Encounter for fitting and adjustment of insulin pump: Secondary | ICD-10-CM | POA: Diagnosis not present

## 2023-12-01 DIAGNOSIS — E1042 Type 1 diabetes mellitus with diabetic polyneuropathy: Secondary | ICD-10-CM | POA: Diagnosis not present

## 2023-12-01 DIAGNOSIS — D649 Anemia, unspecified: Secondary | ICD-10-CM | POA: Diagnosis not present

## 2023-12-01 DIAGNOSIS — M8580 Other specified disorders of bone density and structure, unspecified site: Secondary | ICD-10-CM | POA: Diagnosis not present

## 2023-12-02 DIAGNOSIS — E1042 Type 1 diabetes mellitus with diabetic polyneuropathy: Secondary | ICD-10-CM | POA: Diagnosis not present

## 2023-12-09 ENCOUNTER — Ambulatory Visit (INDEPENDENT_AMBULATORY_CARE_PROVIDER_SITE_OTHER): Admitting: Family Medicine

## 2023-12-09 VITALS — BP 160/70 | HR 56 | Temp 98.9°F | Resp 16 | Ht 61.0 in | Wt 129.8 lb

## 2023-12-09 DIAGNOSIS — E785 Hyperlipidemia, unspecified: Secondary | ICD-10-CM | POA: Diagnosis not present

## 2023-12-09 DIAGNOSIS — E1165 Type 2 diabetes mellitus with hyperglycemia: Secondary | ICD-10-CM | POA: Diagnosis not present

## 2023-12-09 DIAGNOSIS — J4520 Mild intermittent asthma, uncomplicated: Secondary | ICD-10-CM | POA: Diagnosis not present

## 2023-12-09 DIAGNOSIS — R42 Dizziness and giddiness: Secondary | ICD-10-CM | POA: Diagnosis not present

## 2023-12-09 DIAGNOSIS — R5383 Other fatigue: Secondary | ICD-10-CM

## 2023-12-09 DIAGNOSIS — E039 Hypothyroidism, unspecified: Secondary | ICD-10-CM | POA: Diagnosis not present

## 2023-12-09 DIAGNOSIS — R0602 Shortness of breath: Secondary | ICD-10-CM

## 2023-12-09 DIAGNOSIS — Z8249 Family history of ischemic heart disease and other diseases of the circulatory system: Secondary | ICD-10-CM | POA: Diagnosis not present

## 2023-12-09 LAB — COMPREHENSIVE METABOLIC PANEL WITH GFR
ALT: 29 U/L (ref 0–35)
AST: 28 U/L (ref 0–37)
Albumin: 4.4 g/dL (ref 3.5–5.2)
Alkaline Phosphatase: 110 U/L (ref 39–117)
BUN: 11 mg/dL (ref 6–23)
CO2: 28 meq/L (ref 19–32)
Calcium: 9.3 mg/dL (ref 8.4–10.5)
Chloride: 104 meq/L (ref 96–112)
Creatinine, Ser: 0.82 mg/dL (ref 0.40–1.20)
GFR: 74.48 mL/min (ref >60.00)
Glucose, Bld: 109 mg/dL — ABNORMAL HIGH (ref 70–99)
Potassium: 4.4 meq/L (ref 3.5–5.1)
Sodium: 140 meq/L (ref 135–145)
Total Bilirubin: 0.3 mg/dL (ref 0.2–1.2)
Total Protein: 7 g/dL (ref 6.0–8.3)

## 2023-12-09 LAB — CBC WITH DIFFERENTIAL/PLATELET
Basophils Absolute: 0 10*3/uL (ref 0.0–0.1)
Basophils Relative: 0.9 % (ref 0.0–3.0)
Eosinophils Absolute: 0.4 10*3/uL (ref 0.0–0.7)
Eosinophils Relative: 8.6 % — ABNORMAL HIGH (ref 0.0–5.0)
HCT: 34.9 % — ABNORMAL LOW (ref 36.0–46.0)
Hemoglobin: 11.4 g/dL — ABNORMAL LOW (ref 12.0–15.0)
Lymphocytes Relative: 27 % (ref 12.0–46.0)
Lymphs Abs: 1.2 10*3/uL (ref 0.7–4.0)
MCHC: 32.8 g/dL (ref 30.0–36.0)
MCV: 92.2 fl (ref 78.0–100.0)
Monocytes Absolute: 0.4 10*3/uL (ref 0.1–1.0)
Monocytes Relative: 10.2 % (ref 3.0–12.0)
Neutro Abs: 2.3 10*3/uL (ref 1.4–7.7)
Neutrophils Relative %: 53.3 % (ref 43.0–77.0)
Platelets: 348 10*3/uL (ref 150.0–400.0)
RBC: 3.78 Mil/uL — ABNORMAL LOW (ref 3.87–5.11)
RDW: 13 % (ref 11.5–15.5)
WBC: 4.4 10*3/uL (ref 4.0–10.5)

## 2023-12-09 LAB — VITAMIN D 25 HYDROXY (VIT D DEFICIENCY, FRACTURES): VITD: 47.76 ng/mL (ref 30.00–100.00)

## 2023-12-09 LAB — LIPID PANEL
Cholesterol: 171 mg/dL (ref 0–200)
HDL: 51.1 mg/dL (ref >39.00)
LDL Cholesterol: 100 mg/dL — ABNORMAL HIGH (ref 0–99)
NonHDL: 120.16
Total CHOL/HDL Ratio: 3
Triglycerides: 102 mg/dL (ref 0.0–149.0)
VLDL: 20.4 mg/dL (ref 0.0–40.0)

## 2023-12-09 LAB — VITAMIN B12: Vitamin B-12: 855 pg/mL (ref 211–911)

## 2023-12-09 MED ORDER — AIRSUPRA 90-80 MCG/ACT IN AERO: 2.0000 | INHALATION_SPRAY | Freq: Four times a day (QID) | RESPIRATORY_TRACT | 3 refills | Status: DC

## 2023-12-09 NOTE — Assessment & Plan Note (Signed)
 Most likely cause of shortness of breath Airsupra  2 puffs qid as needed Return to office as needed

## 2023-12-09 NOTE — Progress Notes (Signed)
 Established Patient Office Visit  Subjective   Patient ID: Tiffany Browning, female    DOB: [birth year]  Age: 68 y.o. MRN: [id number]  Chief Complaint  Patient presents with   Hair Loss   Shortness of Breath    Pt states having occ SOB and fatigue. No chest pain.    HPI Discussed the use of AI scribe software for clinical note transcription with the patient, who gave verbal consent to proceed.  History of Present Illness Tiffany Browning is a 68 year old female with diabetes and asthma who presents with diarrhea, fatigue, and shortness of breath.  She has been experiencing diarrhea at least once every two weeks for the past two months, with episodes occurring up to three times in a single morning. These episodes are accompanied by fatigue, which impacts her ability to work out at the gym with her personal trainer four days a week.  She describes lightheadedness and dizziness during workouts, necessitating breaks. She also experiences shortness of breath while working in the garden, which she attributes in part to her asthma. She uses a Primatene  mist inhaler as needed, particularly when engaging in outdoor activities like mowing the yard.  She recently visited her endocrinologist and was dissatisfied with her A1c level of 7.6, preferring to keep it in the six range. She uses a CGM (Libre 3) and has experienced fluctuations in her blood sugar levels, including nocturnal hypoglycemia that wakes her up. Adjustments to her insulin  pump have led to some improvement.  She has a family history of heart disease, with her brother having had a heart attack and bypass surgery, and her father having had a heart attack in his 20. Both were heavy smokers, and she grew up in a household with secondhand smoke, which she believes contributed to her asthma.  She is currently taking several medications and supplements, including insulin , Synthroid , Singulair , Crestor , and a variety of vitamins and  supplements for general health and hair loss. She has been on Nutrafol for two months for hair loss, with no noticeable results yet.  No chest pain, palpitations, or swelling in the ankles, but she does experience shortness of breath with exertion. She had COVID over Christmas and feels that some of her fatigue may be residual from that illness.   Patient Active Problem List   Diagnosis Date Noted   SOB (shortness of breath) on exertion 12/09/2023   Dizzy 12/09/2023   Family history of early CAD 12/09/2023   Preventative health care 06/23/2022   Mild intermittent asthma 04/04/2021   Other fatigue 04/04/2021   Hair loss 04/04/2021   Trigger finger of right thumb 09/13/2019   Bilateral carpal tunnel syndrome 03/01/2019   Trigger finger 07/27/2018   Acquired trigger finger of left ring finger 03/14/2018   Medial epicondylitis 03/14/2018   Pain in finger of left hand 03/14/2018   Long term (current) use of insulin  (HCC) 01/11/2018   Encounter for general adult medical examination without abnormal findings 01/07/2017   Pansinusitis 12/21/2016   Bronchitis 10/31/2016   Acute urticaria 08/07/2016   History of therapeutic radiation 06/30/2016   Family history of breast cancer    Family history of kidney cancer    Breast cancer of upper-outer quadrant of right female breast (HCC) 01/28/2016   Sinusitis, acute maxillary 07/11/2014   Wheezing 07/11/2014   Hyperlipemia 03/13/2013   Systolic murmur 03/13/2013   VAGINITIS, CANDIDAL 02/06/2010   VAGINITIS, BACTERIAL 02/06/2010   Asthma without status asthmaticus 09/30/2009  DYSURIA 09/20/2007   Hypothyroidism 02/21/2007   Type 1 diabetes mellitus (HCC) 02/21/2007   EXTERNAL OTITIS 02/21/2007   ALLERGIC RHINITIS 02/21/2007   ASTHMA 02/21/2007   Past Medical History:  Diagnosis Date   Allergy    Asthma    under control   Breast cancer (HCC)    Diabetes mellitus    Type 1-on insulin  pump   Family history of breast cancer    Family  history of kidney cancer    Heart murmur    Hypothyroidism    Personal history of radiation therapy    Thyroid  disease    Hypothyroidism   Past Surgical History:  Procedure Laterality Date   ABDOMINAL HYSTERECTOMY     APPENDECTOMY     BREAST BIOPSY Right 02/14/2016   BREAST BIOPSY Right 01/09/2016   BREAST LUMPECTOMY Right 03/26/2016   BREAST LUMPECTOMY WITH AXILLARY LYMPH NODE BIOPSY Right 03/26/2016   Procedure: RIGHT BREAST LUMPECTOMY WITH AXILLARY LYMPH NODE BIOPSY;  Surgeon: Lillette Reid III, MD;  Location: [facility];  Service: General;  Laterality: Right;   CARPAL TUNNEL RELEASE Right 02/09/2019   Procedure: RIGHT CARPAL TUNNEL RELEASE;  Surgeon: Lyanne Sample, MD;  Location: [facility];  Service: Orthopedics;  Laterality: Right;   CARPAL TUNNEL RELEASE Left 02/05/2022   Procedure: CARPAL TUNNEL RELEASE;  Surgeon: Brunilda Capra, MD;  Location: [facility];  Service: Orthopedics;  Laterality: Left;  30 MIN   NASAL SINUS SURGERY     ruptured ovarian cyst     SHOULDER ARTHROSCOPY  2004   b/L --second one 2011   TONSILECTOMY, ADENOIDECTOMY, BILATERAL MYRINGOTOMY AND TUBES     TRIGGER FINGER RELEASE Right 07/04/2020   Procedure: RELEASE TRIGGER FINGER/A-1 PULLEY;  Surgeon: Lyanne Sample, MD;  Location: [facility];  Service: Orthopedics;  Laterality: Right;  IV REGIONAL FOREARM BLOCK   TUBAL LIGATION     then reversed it   Social History   Tobacco Use   Smoking status: Never   Smokeless tobacco: Never  Vaping Use   Vaping status: Never Used  Substance Use Topics   Alcohol  use: Yes    Comment: occ   Drug use: No   Social History   Socioeconomic History   Marital status: Married    Spouse name: Tommy   Number of children: 0   Years of education: Not on file   Highest education level: Associate degree: academic program  Occupational History   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  Tobacco Use   Smoking status: Never    Smokeless tobacco: Never  Vaping Use   Vaping status: Never Used  Substance and Sexual Activity   Alcohol  use: Yes    Comment: occ   Drug use: No   Sexual activity: Yes    Partners: Male  Other Topics Concern   Not on file  Social History Narrative   Not on file   Social Drivers of Health   Financial Resource Strain: Low Risk  (12/09/2023)   Overall Financial Resource Strain (CARDIA)    Difficulty of Paying Living Expenses: Not hard at all  Food Insecurity: No Food Insecurity (12/09/2023)   Hunger Vital Sign    Worried About Running Out of Food in the Last Year: Never true    Ran Out of Food in the Last Year: Never true  Transportation Needs: No Transportation Needs (12/09/2023)   PRAPARE - Administrator, Civil Service (Medical): No    Lack of Transportation (Non-Medical):  No  Physical Activity: Sufficiently Active (12/09/2023)   Exercise Vital Sign    Days of Exercise per Week: 3 days    Minutes of Exercise per Session: 60 min  Stress: No Stress Concern Present (12/09/2023)   [employer] of Occupational Health - Occupational Stress Questionnaire    Feeling of Stress : Only a little  Social Connections: Moderately Integrated (12/09/2023)   Social Connection and Isolation Panel [NHANES]    Frequency of Communication with Friends and Family: Once a week    Frequency of Social Gatherings with Friends and Family: Once a week    Attends Religious Services: More than 4 times per year    Active Member of [employer] or Organizations: Yes    Attends Banker Meetings: More than 4 times per year    Marital Status: Married  Catering manager Violence: Not At Risk (07/01/2023)   Humiliation, Afraid, Rape, and Kick questionnaire    Fear of Current or Ex-Partner: No    Emotionally Abused: No    Physically Abused: No    Sexually Abused: No   Family Status  Relation Name Status   Mother SHAVONTE ZHAO Deceased at age 62       pneumonia   Father Alexander Anes  Deceased   Sister robin Alive   Brother Kaytelynn Scripter Alive   Sister debbie Alive   Mat Aunt x4 Deceased   Mat Uncle x4 Deceased   Pat Uncle x2 Deceased   MGM  Deceased at age 56   MGF  Deceased       suicide   PGM  Deceased   PGF  Deceased   Other Nephew Alive  No partnership data on file   Family History  Problem Relation Age of Onset   Cancer Mother        cervical   Hypertension Mother    Hyperlipidemia Mother    Arthritis Mother    Heart disease Father 72       MI---- cabg before that   Alcohol  abuse Father    Hypothyroidism Sister    Breast cancer Sister 16   Cancer Sister    Heart disease Brother 89       MI--- second one 23   Hyperlipidemia Brother    Hypertension Brother    Hypothyroidism Sister    Kidney cancer Other 42       son of her sister with breast cancer   No Known Allergies    Review of Systems  Constitutional:  Positive for malaise/fatigue. Negative for fever.  HENT:  Negative for congestion.   Eyes:  Negative for blurred vision.  Respiratory:  Positive for shortness of breath and wheezing. Negative for cough.   Cardiovascular:  Negative for chest pain, palpitations and leg swelling.  Gastrointestinal:  Negative for vomiting.  Musculoskeletal:  Negative for back pain.  Skin:  Negative for rash.  Neurological:  Negative for loss of consciousness and headaches.      Objective:     BP (!) 160/70 (BP Location: Right Arm, Patient Position: Sitting, Cuff Size: Normal)   Pulse (!) 56   Temp 98.9 F (37.2 C) (Oral)   Resp 16   Ht 5\' 1"  (1.549 m)   Wt 129 lb 12.8 oz (58.9 kg)   SpO2 99%   BMI 24.53 kg/m  BP Readings from Last 3 Encounters:  12/09/23 (!) 160/70  07/27/23 122/78  06/23/22 108/68   Wt Readings from Last 3 Encounters:  12/09/23 129 lb 12.8 oz (58.9 kg)  07/27/23 132 lb 12.8 oz (60.2 kg)  06/23/22 133 lb 6.4 oz (60.5 kg)   SpO2 Readings from Last 3 Encounters:  12/09/23 99%  07/27/23 100%  06/23/22 97%       Physical Exam Vitals and nursing note reviewed.  Constitutional:      General: She is not in acute distress.    Appearance: Normal appearance. She is well-developed.  HENT:     Head: Normocephalic and atraumatic.     Right Ear: Tympanic membrane, ear canal and external ear normal. There is no impacted cerumen.     Left Ear: Tympanic membrane, ear canal and external ear normal. There is no impacted cerumen.     Nose: Nose normal.     Mouth/Throat:     Mouth: Mucous membranes are moist.     Pharynx: Oropharynx is clear. No oropharyngeal exudate or posterior oropharyngeal erythema.  Eyes:     General: No scleral icterus.       Right eye: No discharge.        Left eye: No discharge.     Conjunctiva/sclera: Conjunctivae normal.     Pupils: Pupils are equal, round, and reactive to light.  Neck:     Thyroid : No thyromegaly or thyroid  tenderness.     Vascular: No JVD.  Cardiovascular:     Rate and Rhythm: Normal rate and regular rhythm.     Heart sounds: Normal heart sounds. No murmur heard. Pulmonary:     Effort: Pulmonary effort is normal. No respiratory distress.     Breath sounds: Normal breath sounds.  Abdominal:     General: Bowel sounds are normal. There is no distension.     Palpations: Abdomen is soft. There is no mass.     Tenderness: There is no abdominal tenderness. There is no guarding or rebound.  Genitourinary:    Vagina: Normal.  Musculoskeletal:        General: Normal range of motion.     Cervical back: Normal range of motion and neck supple.     Right lower leg: No edema.     Left lower leg: No edema.  Lymphadenopathy:     Cervical: No cervical adenopathy.  Skin:    General: Skin is warm and dry.     Findings: No erythema or rash.  Neurological:     Mental Status: She is alert and oriented to person, place, and time.     Cranial Nerves: No cranial nerve deficit.     Deep Tendon Reflexes: Reflexes are normal and symmetric.  Psychiatric:        Mood and  Affect: Mood normal.        Behavior: Behavior normal.        Thought Content: Thought content normal.        Judgment: Judgment normal.      No results found for any visits on 12/09/23.  Last CBC Lab Results  Component Value Date   WBC 5.5 07/27/2023   HGB 12.4 07/27/2023   HCT 37.7 07/27/2023   MCV 94.3 07/27/2023   MCH 30.5 03/29/2020   RDW 12.3 07/27/2023   PLT 349.0 07/27/2023   Last metabolic panel Lab Results  Component Value Date   GLUCOSE 110 (H) 07/27/2023   NA 141 07/27/2023   K 4.8 07/27/2023   CL 105 07/27/2023   CO2 27 07/27/2023   BUN 15 07/27/2023   CREATININE 0.78 07/27/2023   GFR 79.29 07/27/2023  CALCIUM  9.4 07/27/2023   PROT 7.2 07/27/2023   ALBUMIN 4.4 07/27/2023   BILITOT 0.4 07/27/2023   ALKPHOS 125 (H) 07/27/2023   AST 44 (H) 07/27/2023   ALT 50 (H) 07/27/2023   ANIONGAP 8 02/02/2022   Last lipids Lab Results  Component Value Date   CHOL 170 07/27/2023   HDL 57.20 07/27/2023   LDLCALC 98 07/27/2023   TRIG 76.0 07/27/2023   CHOLHDL 3 07/27/2023   Last hemoglobin A1c Lab Results  Component Value Date   HGBA1C 7.7 (H) 07/27/2023   Last thyroid  functions Lab Results  Component Value Date   TSH 0.90 07/27/2023   T4TOTAL 9.6 04/04/2021   Last vitamin D  Lab Results  Component Value Date   VD25OH 55.23 07/27/2023   Last vitamin B12 and Folate Lab Results  Component Value Date   VITAMINB12 904 07/27/2023      The 10-year ASCVD risk score (Arnett DK, et al., 2019) is: 16%    Assessment & Plan:   Problem List Items Addressed This Visit       Unprioritized   Other fatigue   Relevant Orders   CBC with Differential/Platelet   Comprehensive metabolic panel with GFR   Vitamin B12   VITAMIN D  25 Hydroxy (Vit-D Deficiency, Fractures)   Mild intermittent asthma   Relevant Medications   Albuterol -Budesonide (AIRSUPRA ) 90-80 MCG/ACT AERO   Hypothyroidism   Relevant Orders   Thyroid  Panel With TSH   Hyperlipemia -  Primary   Relevant Orders   Lipid panel   Lipoprotein A (LPA)   CT CARDIAC SCORING   SOB (shortness of breath) on exertion   Check echo  EKG --- sinus brady      Relevant Orders   ECHOCARDIOGRAM COMPLETE   Family history of early CAD   Relevant Orders   CT CARDIAC SCORING   Dizzy   Relevant Orders   EKG 12-Lead (Completed)   ECHOCARDIOGRAM COMPLETE   Asthma without status asthmaticus   Most likely cause of shortness of breath Airsupra  2 puffs qid as needed Return to office as needed       Relevant Medications   Albuterol -Budesonide (AIRSUPRA ) 90-80 MCG/ACT AERO   Other Visit Diagnoses       Type 2 diabetes mellitus with hyperglycemia, without long-term current use of insulin  (HCC)       Relevant Orders   CT CARDIAC SCORING     Assessment and Plan Assessment & Plan Type 2 diabetes mellitus with hyperglycemia   Her recent A1c is 7.6, above the target of 6.0, with fluctuations in blood glucose levels, including nocturnal hypoglycemia. The insulin  pump has been reprogrammed, showing some improvement. She uses a CGM (Libre 3) for monitoring and experiences nausea when blood glucose is extremely high. Continue with reprogrammed insulin  pump settings and maintain A1c within target range. Encourage communication with endocrinologist for ongoing management.  Family history of ischemic heart disease   Her father and brother have had myocardial infarctions, raising her concern about her own risk, especially with diabetes as a risk factor. LDL was 98 in December, above the target of less than 70 for patients with diabetes. Non-HDL cholesterol was 112, with a goal of less than 100. Discussed the option of a calcium  score CT scan to assess cardiac risk, noting potential insurance coverage issues and a cost of approximately $100. LP(a) genetic testing was also discussed to assess risk and potential need for high-dose statin therapy. Consider calcium  score CT scan and LP(a) genetic testing.  Repeat lipid panel to assess current cholesterol levels.  Asthma, unspecified   She experiences dyspnea during exertion, such as gardening and mowing the lawn, and uses Primatene  mist inhaler as needed, typically once a day, along with Singulair  (montelukast ) 10 mg daily. Wheezing and lightheadedness occur during physical activity. Primatene  mist is being replaced with AirSupra , a combination rescue inhaler with a steroid component, for longer-lasting relief. Discontinue Primatene  mist inhaler and prescribe AirSupra  as a rescue inhaler, to be used as needed up to four times a day. Continue Singulair  (montelukast ) 10 mg daily.  Post-COVID-19 condition   She reports ongoing fatigue and dyspnea on exertion since having COVID-19 over Christmas, similar to a previous infection in 2022. Encourage gradual increase in physical activity as tolerated.  Iron deficiency anemia   Recent improvement noted with iron supplementation. Anemia within the last six months affected her ability to donate blood. Continue iron supplementation and monitor hemoglobin and iron levels.  Elevated liver enzymes   Previous elevated liver enzymes noted in December labs. Recent labs from endocrinologist reportedly show normal liver function, but results are not available for review. Request her to upload or fax recent lab results from endocrinologist for review.    Return in about 6 months (around 06/09/2024), or if symptoms worsen or fail to improve, for fasting, annual exam.    Estill Hemming, DO

## 2023-12-09 NOTE — Assessment & Plan Note (Signed)
 Check echo  EKG --- sinus brady

## 2023-12-09 NOTE — Patient Instructions (Signed)
 Shortness of Breath, Adult Shortness of breath means you have trouble breathing. Shortness of breath could be a sign of a medical problem. Follow these instructions at home:  Pollution Do not smoke or use any products that contain nicotine or tobacco. If you need help quitting, ask your doctor. Avoid things that can make it harder to breathe, such as: Smoke of all kinds. This includes smoke from campfires or forest fires. Do not smoke or allow others to smoke in your home. Mold. Dust. Air pollution. Chemical smells. Things that can give you an allergic reaction (allergens) if you have allergies. Keep your living space clean. Use products that help remove mold and dust. General instructions Watch for any changes in your symptoms. Take over-the-counter and prescription medicines only as told by your doctor. This includes oxygen therapy and inhaled medicines. Rest as needed. Return to your normal activities when your doctor says that it is safe. Keep all follow-up visits. Contact a doctor if: Your condition does not get better as soon as expected. You have a hard time doing your normal activities, even after you rest. You have new symptoms. You cannot walk up stairs. You cannot exercise the way you normally do. Get help right away if: Your shortness of breath gets worse. You have trouble breathing when you are resting. You feel light-headed or you faint. You have a cough that is not helped by medicines. You cough up blood. You have pain with breathing. You have pain in your chest, arms, shoulders, or belly (abdomen). You have a fever. These symptoms may be an emergency. Get help right away. Call 911. Do not wait to see if the symptoms will go away. Do not drive yourself to the hospital. Summary Shortness of breath is when you have trouble breathing enough air. It can be a sign of a medical problem. Avoid things that make it hard for you to breathe, such as smoking, pollution,  mold, and dust. Watch for any changes in your symptoms. Contact your doctor if you do not get better or you get worse. This information is not intended to replace advice given to you by your health care provider. Make sure you discuss any questions you have with your health care provider. Document Revised: 03/22/2021 Document Reviewed: 03/22/2021 Elsevier Patient Education  2024 [employer].

## 2023-12-13 ENCOUNTER — Ambulatory Visit (HOSPITAL_BASED_OUTPATIENT_CLINIC_OR_DEPARTMENT_OTHER)
Admission: RE | Admit: 2023-12-13 | Discharge: 2023-12-13 | Disposition: A | Discharge status: Home / Self Care | Payer: Self-pay | Source: Ambulatory Visit | Attending: Family Medicine | Admitting: Family Medicine

## 2023-12-13 DIAGNOSIS — E785 Hyperlipidemia, unspecified: Secondary | ICD-10-CM | POA: Insufficient documentation

## 2023-12-13 DIAGNOSIS — Z8249 Family history of ischemic heart disease and other diseases of the circulatory system: Secondary | ICD-10-CM | POA: Insufficient documentation

## 2023-12-13 DIAGNOSIS — E1165 Type 2 diabetes mellitus with hyperglycemia: Secondary | ICD-10-CM | POA: Insufficient documentation

## 2023-12-15 LAB — THYROID PANEL WITH TSH
Free Thyroxine Index: 2.4 (ref 1.4–3.8)
T3 Uptake: 28 % (ref 22–35)
T4, Total: 8.6 ug/dL (ref 5.1–11.9)
TSH: 0.96 m[IU]/L (ref 0.40–4.50)

## 2023-12-15 LAB — LIPOPROTEIN A (LPA): Lipoprotein (a): 137 nmol/L — ABNORMAL HIGH (ref <75)

## 2023-12-16 ENCOUNTER — Ambulatory Visit (HOSPITAL_BASED_OUTPATIENT_CLINIC_OR_DEPARTMENT_OTHER)
Admission: RE | Admit: 2023-12-16 | Discharge: 2023-12-16 | Disposition: A | Discharge status: Home / Self Care | Source: Ambulatory Visit | Attending: Family Medicine | Admitting: Family Medicine

## 2023-12-16 DIAGNOSIS — R42 Dizziness and giddiness: Secondary | ICD-10-CM | POA: Diagnosis not present

## 2023-12-16 DIAGNOSIS — R0602 Shortness of breath: Secondary | ICD-10-CM | POA: Insufficient documentation

## 2023-12-17 ENCOUNTER — Other Ambulatory Visit: Payer: Self-pay | Admitting: Family Medicine

## 2023-12-17 ENCOUNTER — Telehealth: Payer: Self-pay

## 2023-12-17 ENCOUNTER — Encounter: Payer: Self-pay | Admitting: Family Medicine

## 2023-12-17 LAB — ECHOCARDIOGRAM COMPLETE
AR max vel: 1.6 cm2
AV Area VTI: 1.7 cm2
AV Area mean vel: 1.61 cm2
AV Mean grad: 4 mmHg
AV Peak grad: 7.7 mmHg
Ao pk vel: 1.39 m/s
Area-P 1/2: 2.59 cm2
Calc EF: 65.2 %
MV M vel: 3.44 m/s
MV Peak grad: 47.3 mmHg
S' Lateral: 2.4 cm
Single Plane A2C EF: 65.8 %
Single Plane A4C EF: 65.6 %

## 2023-12-17 NOTE — Telephone Encounter (Signed)
 Noted. Not reviewed by pcp yet

## 2023-12-17 NOTE — Telephone Encounter (Signed)
 Copied from CRM [id number]. Topic: Clinical - Lab/Test Results >> Dec 17, 2023  9:30 AM Martinique E wrote: Reason for CRM: Patient called in stating that when her echocardiogram results are in to please give her a call to discuss. Callback number [phone].

## 2023-12-18 ENCOUNTER — Encounter: Payer: Self-pay | Admitting: Family Medicine

## 2023-12-20 ENCOUNTER — Other Ambulatory Visit: Payer: Self-pay | Admitting: Family Medicine

## 2023-12-20 MED ORDER — CICLOPIROX 8 % EX SOLN: Freq: Every day | CUTANEOUS | 0 refills | Status: DC

## 2023-12-23 ENCOUNTER — Encounter: Payer: Self-pay | Admitting: Family Medicine

## 2024-01-18 DIAGNOSIS — M65341 Trigger finger, right ring finger: Secondary | ICD-10-CM | POA: Diagnosis not present

## 2024-01-18 DIAGNOSIS — M79644 Pain in right finger(s): Secondary | ICD-10-CM | POA: Diagnosis not present

## 2024-02-03 DIAGNOSIS — B351 Tinea unguium: Secondary | ICD-10-CM | POA: Diagnosis not present

## 2024-02-03 DIAGNOSIS — E119 Type 2 diabetes mellitus without complications: Secondary | ICD-10-CM | POA: Diagnosis not present

## 2024-02-04 ENCOUNTER — Encounter: Payer: Self-pay | Admitting: Family Medicine

## 2024-02-14 DIAGNOSIS — E109 Type 1 diabetes mellitus without complications: Secondary | ICD-10-CM | POA: Diagnosis not present

## 2024-02-17 ENCOUNTER — Ambulatory Visit: Admitting: Physician Assistant

## 2024-02-17 VITALS — BP 112/69 | HR 61 | Ht 61.0 in | Wt 129.4 lb

## 2024-02-17 DIAGNOSIS — B351 Tinea unguium: Secondary | ICD-10-CM

## 2024-02-17 DIAGNOSIS — S00412A Abrasion of left ear, initial encounter: Secondary | ICD-10-CM | POA: Diagnosis not present

## 2024-02-17 DIAGNOSIS — E1169 Type 2 diabetes mellitus with other specified complication: Secondary | ICD-10-CM

## 2024-02-17 MED ORDER — CIPROFLOXACIN-DEXAMETHASONE 0.3-0.1 % OT SUSP: 4.0000 [drp] | Freq: Two times a day (BID) | OTIC | 0 refills | Status: AC

## 2024-02-17 NOTE — Progress Notes (Signed)
 Established patient visit   Patient: Tiffany Browning   DOB: 04-26-1956   69 y.o. Female  MRN: [id number] Visit Date: 02/17/2024  Today's healthcare provider: Manuelita Flatness, PA-C   Chief Complaint  Patient presents with   Ear Pain    Left ear.  After pulling out wax with a bobby pin   Subjective     Pt reports pain in L ear, after using a bobby pin in her ear canal to pull out wax x 4 days. Reports using tylenol  for pain relief.  She would also like our opinion on her onychomycosis.    Medications: Outpatient Medications Prior to Visit  Medication Sig   ciclopirox  (PENLAC ) 8 % solution Apply topically at bedtime. Apply over nail and surrounding skin. Apply daily over previous coat. After seven (7) days, may remove with alcohol  and continue cycle.   rosuvastatin  (CRESTOR ) 5 MG tablet TAKE 1 TABLET BY MOUTH DAILY (Patient taking differently: Take 20 mg by mouth daily.)   Albuterol -Budesonide (AIRSUPRA ) 90-80 MCG/ACT AERO Inhale 2 puffs into the lungs every 6 (six) hours.   Alpha-Lipoic Acid 300 MG CAPS Take by mouth daily.   Calcium  Carb-Cholecalciferol (CALTRATE 600+D3) 600-800 MG-UNIT TABS    Calcium -Magnesium-Vitamin D  (CALCIUM  MAGNESIUM PO) Take 1 tablet by mouth daily.   fish oil-omega-3 fatty acids 1000 MG capsule Take 2 g by mouth daily.   glucose blood (FREESTYLE LITE) test strip Check blood sugar 6 times a day   Insulin  Lispro-aabc (LYUMJEV ) 100 UNIT/ML SOLN Inject up to 35 Units as directed with insulin  pump   levothyroxine  (SYNTHROID ) 175 MCG tablet Take 175 mcg by mouth daily before breakfast.   montelukast  (SINGULAIR ) 10 MG tablet TAKE 1 TABLET BY MOUTH AT  BEDTIME PT DUE FOR OFFICE VISIT   No facility-administered medications prior to visit.    Review of Systems  Constitutional:  Negative for fatigue and fever.  HENT:  Positive for ear pain.   Respiratory:  Negative for cough and shortness of breath.   Cardiovascular:  Negative for chest pain and leg  swelling.  Gastrointestinal:  Negative for abdominal pain.  Neurological:  Negative for dizziness and headaches.       Objective    BP 112/69   Pulse 61   Ht 5' 1 (1.549 m)   Wt 129 lb 6.4 oz (58.7 kg)   BMI 24.45 kg/m    Physical Exam Vitals reviewed.  Constitutional:      Appearance: She is not ill-appearing.  HENT:     Head: Normocephalic.     Right Ear: Tympanic membrane normal.     Left Ear: Tympanic membrane normal.     Ears:     Comments: No visible laceration or abrasion, no exudate in L ear canal. Possible mild edema Eyes:     Conjunctiva/sclera: Conjunctivae normal.  Cardiovascular:     Rate and Rhythm: Normal rate.  Pulmonary:     Effort: Pulmonary effort is normal. No respiratory distress.  Feet:     Comments: Bil big toes with thickened, flaking nails, discolored Neurological:     Mental Status: She is alert and oriented to person, place, and time.  Psychiatric:        Mood and Affect: Mood normal.        Behavior: Behavior normal.      No results found for any visits on 02/17/24.  Assessment & Plan    Abrasion of left ear canal, initial encounter No visible injury, pt  reporting pain Educated on foreign objects in ear canal . Advised tylenol , if pain increases, edema increases, rx ciprodex.   -     Ciprofloxacin -dexAMETHasone ; Place 4 drops into the left ear 2 (two) times daily for 5 days.  Dispense: 7.5 mL; Refill: 0  Onychomycosis of multiple toenails with type 2 diabetes mellitus (HCC) Encouraged she fb with podiatry.   Return if symptoms worsen or fail to improve.       Manuelita Flatness, PA-C  [facility] [phone] (phone) [phone] (fax)  [facility]

## 2024-02-23 DIAGNOSIS — E1042 Type 1 diabetes mellitus with diabetic polyneuropathy: Secondary | ICD-10-CM | POA: Diagnosis not present

## 2024-03-07 DIAGNOSIS — L900 Lichen sclerosus et atrophicus: Secondary | ICD-10-CM | POA: Diagnosis not present

## 2024-03-09 ENCOUNTER — Telehealth: Payer: Self-pay

## 2024-03-09 MED ORDER — ROSUVASTATIN CALCIUM 20 MG PO TABS: 20.0000 mg | ORAL_TABLET | Freq: Every day | ORAL | 0 refills | Status: DC

## 2024-03-09 NOTE — Telephone Encounter (Signed)
 Copied from CRM [id number]. Topic: Clinical - Prescription Issue >> Mar 09, 2024  8:17 AM Treva T wrote: Reason for CRM: Patient calling, states she needs medication dosage for rosuvastatin  (CRESTOR ) 5 MG tablet, to be increased to 20 mg, as per previous discussion at last office visit.  Per patient per pharmacy states increased dose in medication has not been received. Requesting updated prescription be sent to pharmacy.   Patient can be reached at [phone] for follow up, or further discussion.  Preferred pharmacy:   [facility] [place], [place] - [address] [place] [address] Phone: [phone] Fax: [phone]

## 2024-03-09 NOTE — Telephone Encounter (Signed)
 Chart reviewed. PCP increased Crestor  from 5mg  to 10mg  on 12/09/23. See below.   Result Care Coordination   Patient Communication   Add MyChart Message   Add Notifications  Back to Top   Lpa elevated ---- increase crestor  10 mg 1 po every day #30  2 refills  Written by Tiffany JONELLE Antonio Cyndee, Tiffany Browning on 12/18/2023  7:09 PM EDT Seen by patient Tiffany Browning on 12/23/2023  1:31 PM   Mychart message from 12/18/23- increased to 20mg    Tiffany Browning, Tiffany Browning to Tiffany Browning     12/20/23  4:35 PM Per Dr. Antonio,     There is no 15 mg but you can go to 10 and then 20 if you'd like   Last read by Tiffany Browning at 1:32PM on 12/23/2023.    Rx sent.

## 2024-04-21 ENCOUNTER — Other Ambulatory Visit: Payer: Self-pay | Admitting: Nurse Practitioner

## 2024-04-21 DIAGNOSIS — Z1231 Encounter for screening mammogram for malignant neoplasm of breast: Secondary | ICD-10-CM

## 2024-04-23 ENCOUNTER — Other Ambulatory Visit: Payer: Self-pay | Admitting: Family Medicine

## 2024-04-27 DIAGNOSIS — M8580 Other specified disorders of bone density and structure, unspecified site: Secondary | ICD-10-CM | POA: Diagnosis not present

## 2024-04-27 DIAGNOSIS — E1042 Type 1 diabetes mellitus with diabetic polyneuropathy: Secondary | ICD-10-CM | POA: Diagnosis not present

## 2024-04-27 DIAGNOSIS — Z853 Personal history of malignant neoplasm of breast: Secondary | ICD-10-CM | POA: Diagnosis not present

## 2024-04-27 DIAGNOSIS — D649 Anemia, unspecified: Secondary | ICD-10-CM | POA: Diagnosis not present

## 2024-05-04 LAB — HEMOGLOBIN A1C: Hemoglobin A1C: 7.5

## 2024-05-10 DIAGNOSIS — M65341 Trigger finger, right ring finger: Secondary | ICD-10-CM | POA: Diagnosis not present

## 2024-05-11 DIAGNOSIS — E109 Type 1 diabetes mellitus without complications: Secondary | ICD-10-CM | POA: Diagnosis not present

## 2024-05-16 DIAGNOSIS — E1042 Type 1 diabetes mellitus with diabetic polyneuropathy: Secondary | ICD-10-CM | POA: Diagnosis not present

## 2024-05-22 ENCOUNTER — Ambulatory Visit
Admission: RE | Admit: 2024-05-22 | Discharge: 2024-05-22 | Disposition: A | Discharge status: Home / Self Care | Source: Ambulatory Visit | Attending: Nurse Practitioner | Admitting: Nurse Practitioner

## 2024-05-22 DIAGNOSIS — Z1231 Encounter for screening mammogram for malignant neoplasm of breast: Secondary | ICD-10-CM | POA: Diagnosis not present

## 2024-06-02 ENCOUNTER — Other Ambulatory Visit: Payer: Self-pay | Admitting: Family Medicine

## 2024-06-02 DIAGNOSIS — J4520 Mild intermittent asthma, uncomplicated: Secondary | ICD-10-CM

## 2024-06-02 NOTE — Telephone Encounter (Signed)
 Pt informed she is due for follow-up (see below). Appt was not scheduled. Rx denied.   Me to CHAUNTEL WINDSOR     04/24/24 10:03 AM Good morning, Per our records, your are due for a follow-up with Dr. Antonio in October. Please call the office to schedule an appointment at your earliest convenience.    Thank you, KDC  Last read by Erminio LITTIE Mace at 11:16AM on 04/24/2024.

## 2024-06-02 NOTE — Telephone Encounter (Signed)
 Copied from CRM [id number]. Topic: Clinical - Medication Refill >> Jun 02, 2024 12:51 PM Burnard DEL wrote: Medication: montelukast  (SINGULAIR ) 10 MG tablet  Has the patient contacted their pharmacy? Yes (Agent: If no, request that the patient contact the pharmacy for the refill. If patient does not wish to contact the pharmacy document the reason why and proceed with request.) (Agent: If yes, when and what did the pharmacy advise?)  This is the patient's preferred pharmacy:    [facility],  - [address] [place]  [address] Phone: [phone] Fax: [phone]  Is this the correct pharmacy for this prescription? Yes If no, delete pharmacy and type the correct one.   Has the prescription been filled recently? No  Is the patient out of the medication? No  Has the patient been seen for an appointment in the last year OR does the patient have an upcoming appointment? Yes  Can we respond through MyChart? Yes  Agent: Please be advised that Rx refills may take up to 3 business days. We ask that you follow-up with your pharmacy.

## 2024-06-15 ENCOUNTER — Other Ambulatory Visit: Payer: Self-pay

## 2024-06-15 DIAGNOSIS — J4520 Mild intermittent asthma, uncomplicated: Secondary | ICD-10-CM

## 2024-06-15 MED ORDER — MONTELUKAST SODIUM 10 MG PO TABS: 10.0000 mg | ORAL_TABLET | Freq: Every day | ORAL | 0 refills | Status: DC

## 2024-06-22 ENCOUNTER — Ambulatory Visit: Admitting: Family Medicine

## 2024-07-04 ENCOUNTER — Ambulatory Visit: Payer: Medicare Other | Admitting: *Deleted

## 2024-07-04 VITALS — Ht 61.0 in | Wt 129.0 lb

## 2024-07-04 DIAGNOSIS — Z0000 Encounter for general adult medical examination without abnormal findings: Secondary | ICD-10-CM | POA: Diagnosis not present

## 2024-07-04 DIAGNOSIS — M8580 Other specified disorders of bone density and structure, unspecified site: Secondary | ICD-10-CM

## 2024-07-04 DIAGNOSIS — Z780 Asymptomatic menopausal state: Secondary | ICD-10-CM

## 2024-07-04 NOTE — Patient Instructions (Addendum)
 Tiffany Browning,  Thank you for taking the time for your Medicare Wellness Visit. I appreciate your continued commitment to your health goals. Please review the care plan we discussed, and feel free to reach out if I can assist you further.  Please note that Annual Wellness Visits do not include a physical exam. Some assessments may be limited, especially if the visit was conducted virtually. If needed, we may recommend an in-person follow-up with your provider.  Goals: Ideal weight 125lb. max weight 130lb.   Ongoing Care Seeing your primary care provider every 3 to 6 months helps us  monitor your health and provide consistent, personalized care.   Dr Antonio Meth:  07/18/24 8:20am Annual Wellness Visit: 07/05/25 1:40pm, telephone  Referrals If a referral was made during today's visit and you haven't received any updates within two weeks, please contact the referred provider directly to check on the status.  Bone Density ([facility], [address] [facility] [address], basement level):  [phone]  Recommended Screenings:  You will need to get the following vaccines at your local pharmacy: Tetanus  Health Maintenance  Topic Date Due   Yearly kidney health urinalysis for diabetes  Never done   Cologuard (Stool DNA test)  Never done   DTaP/Tdap/Td vaccine (2 - Td or Tdap) 11/22/2023   Hemoglobin A1C  01/25/2024   COVID-19 Vaccine (5 - 2025-26 season) 04/17/2024   Eye exam for diabetics  11/16/2024   Yearly kidney function blood test for diabetes  12/08/2024   Complete foot exam   02/02/2025   Breast Cancer Screening  05/22/2025   Medicare Annual Wellness Visit  07/04/2025   Pneumococcal Vaccine for age over 14  Completed   Flu Shot  Completed   DEXA scan (bone density measurement)  Completed   Hepatitis C Screening  Completed   Zoster (Shingles) Vaccine  Completed   Meningitis B Vaccine  Aged Out   Colon Cancer Screening  Discontinued       07/04/2024    1:11 PM  Advanced Directives   Does Patient Have a Medical Advance Directive? Yes  Type of Advance Directive Healthcare Power of Attorney  Does patient want to make changes to medical advance directive? No - Guardian declined  Copy of Healthcare Power of Attorney in Chart? Yes - validated most recent copy scanned in chart (See row information)    Vision: Annual vision screenings are recommended for early detection of glaucoma, cataracts, and diabetic retinopathy. These exams can also reveal signs of chronic conditions such as diabetes and high blood pressure.  Dental: Annual dental screenings help detect early signs of oral cancer, gum disease, and other conditions linked to overall health, including heart disease and diabetes.  Please see the attached documents for additional preventive care recommendations.

## 2024-07-04 NOTE — Progress Notes (Signed)
 Chief Complaint  Patient presents with   Medicare Wellness     Subjective:   Tiffany Browning is a 68 y.o. female who presents for a Medicare Annual Wellness Visit.  Allergies (verified) Patient has no known allergies.   History: Past Medical History:  Diagnosis Date   Allergy    Asthma    under control   Breast cancer (HCC)    Diabetes mellitus    Type 1-on insulin  pump   Family history of breast cancer    Family history of kidney cancer    Heart murmur    Hypothyroidism    Personal history of radiation therapy    Thyroid  disease    Hypothyroidism   Past Surgical History:  Procedure Laterality Date   ABDOMINAL HYSTERECTOMY     APPENDECTOMY     BREAST BIOPSY Right 02/14/2016   BREAST BIOPSY Right 01/09/2016   BREAST LUMPECTOMY Right 03/26/2016   BREAST LUMPECTOMY WITH AXILLARY LYMPH NODE BIOPSY Right 03/26/2016   Procedure: RIGHT BREAST LUMPECTOMY WITH AXILLARY LYMPH NODE BIOPSY;  Surgeon: Deward Null III, MD;  Location: [facility];  Service: General;  Laterality: Right;   CARPAL TUNNEL RELEASE Right 02/09/2019   Procedure: RIGHT CARPAL TUNNEL RELEASE;  Surgeon: Murrell Kuba, MD;  Location: [facility];  Service: Orthopedics;  Laterality: Right;   CARPAL TUNNEL RELEASE Left 02/05/2022   Procedure: CARPAL TUNNEL RELEASE;  Surgeon: Murrell Drivers, MD;  Location: [facility];  Service: Orthopedics;  Laterality: Left;  30 MIN   CATARACT EXTRACTION, BILATERAL  2023   NASAL SINUS SURGERY     ruptured ovarian cyst     SHOULDER ARTHROSCOPY  2004   b/L --second one 2011   TONSILECTOMY, ADENOIDECTOMY, BILATERAL MYRINGOTOMY AND TUBES     TRIGGER FINGER RELEASE Right 07/04/2020   Procedure: RELEASE TRIGGER FINGER/A-1 PULLEY;  Surgeon: Murrell Kuba, MD;  Location: [facility];  Service: Orthopedics;  Laterality: Right;  IV REGIONAL FOREARM BLOCK   TUBAL LIGATION     then reversed it   Family History  Problem Relation Age of Onset   Cancer  Mother        cervical   Hypertension Mother    Hyperlipidemia Mother    Arthritis Mother    Heart disease Father 35       MI---- cabg before that   Alcohol  abuse Father    Hypothyroidism Sister    Breast cancer Sister 85   Cancer Sister    Heart disease Brother 25       MI--- second one 74   Hyperlipidemia Brother    Hypertension Brother    Hypothyroidism Sister    Kidney cancer Other 74       son of her sister with breast cancer   Social History   Occupational History   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  Tobacco Use   Smoking status: Never   Smokeless tobacco: Never  Vaping Use   Vaping status: Never Used  Substance and Sexual Activity   Alcohol  use: Yes    Comment: occ   Drug use: No   Sexual activity: Yes    Partners: Male   Tobacco Counseling Counseling given: Not Answered  SDOH Screenings   Food Insecurity: No Food Insecurity (07/04/2024)  Housing: Low Risk  (07/04/2024)  Transportation Needs: No Transportation Needs (07/04/2024)  Utilities: Not At Risk (07/04/2024)  Alcohol  Screen: Low Risk  (06/15/2024)  Depression (PHQ2-9): Low Risk  (07/04/2024)  Financial Resource Strain:  Low Risk  (06/15/2024)  Physical Activity: Sufficiently Active (07/04/2024)  Social Connections: Socially Integrated (07/04/2024)  Stress: No Stress Concern Present (07/04/2024)  Tobacco Use: Low Risk  (07/04/2024)  Health Literacy: Adequate Health Literacy (07/01/2023)   See flowsheets for full screening details  Depression Screen PHQ 2 & 9 Depression Scale- Over the past 2 weeks, how often have you been bothered by any of the following problems? Little interest or pleasure in doing things: 0 Feeling down, depressed, or hopeless (PHQ Adolescent also includes...irritable): 0 PHQ-2 Total Score: 0 Trouble falling or staying asleep, or sleeping too much: 1 (gets up to go to bathroom) Feeling tired or having little energy: 0 Poor appetite or overeating (PHQ  Adolescent also includes...weight loss): 0 Feeling bad about yourself - or that you are a failure or have let yourself or your family down: 0 Trouble concentrating on things, such as reading the newspaper or watching television (PHQ Adolescent also includes...like school work): 0 Moving or speaking so slowly that other people could have noticed. Or the opposite - being so fidgety or restless that you have been moving around a lot more than usual: 0 Thoughts that you would be better off dead, or of hurting yourself in some way: 0 PHQ-9 Total Score: 1  Depression Treatment Depression Interventions/Treatment : PHQ2-9 Score <4 Follow-up Not Indicated     Goals Addressed             This Visit's Progress    Ideal weight 125lb. max weight 130lb.       COMPLETED: maintain or decrease weight by 5 pounds       Interventions Today    Flowsheet Row Most Recent Value  Chronic Disease   Chronic disease during today's visit Diabetes, Other  [hypothyroid]  General Interventions   General Interventions Discussed/Reviewed General Interventions Reviewed  [Evaluation of current treatment plan for health condition and patient's adherence to plan. reviewed medications,assessed hypoglyemic episoded. discussed blood sugar management. assess if any hypoglycemic concerns, discussed patient weight loss goals.]  Education Interventions   Education Provided Provided Education  Provided Verbal Education On When to see the doctor, Medication, Blood Sugar Monitoring  Pharmacy Interventions   Pharmacy Dicussed/Reviewed Pharmacy Topics Reviewed              Visit info / Clinical Intake: Medicare Wellness Visit Type:: Subsequent Annual Wellness Visit Persons participating in visit:: patient Medicare Wellness Visit Mode:: Telephone If telephone:: video declined Because this visit was a virtual/telehealth visit:: pt reported vitals If Telephone or Video please confirm:: I connected with the patient using  audio enabled telemedicine application and verified that I am speaking with the correct person using two identifiers; I discussed the limitations of evaluation and management by telemedicine; The patient expressed understanding and agreed to proceed Patient Location:: home Provider Location:: office Information given by:: patient Interpreter Needed?: No Pre-visit prep was completed: yes AWV questionnaire completed by patient prior to visit?: no Living arrangements:: lives with spouse/significant other Patient's Overall Health Status Rating: very good Typical amount of pain: none Does pain affect daily life?: no Are you currently prescribed opioids?: no  Dietary Habits and Nutritional Risks How many meals a day?: 3 Eats fruit and vegetables daily?: yes Most meals are obtained by: preparing own meals In the last 2 weeks, have you had any of the following?: none Diabetic:: (!) yes Any non-healing wounds?: no How often do you check your BS?: continuous glucose monitor Would you like to be referred to a Nutritionist  or for Diabetic Management? : no  Functional Status Activities of Daily Living (to include ambulation/medication): Independent Ambulation: Independent Medication Administration: Independent Home Management: Independent Manage your own finances?: yes Primary transportation is: driving Concerns about vision?: no *vision screening is required for WTM* (up to date with Lurena Robin) Concerns about hearing?: (!) yes (gets wax buildup periodically) Uses hearing aids?: no  Fall Screening Falls in the past year?: 0 Number of falls in past year: 0 Was there an injury with Fall?: 0 Fall Risk Category Calculator: 0 Patient Fall Risk Level: Low Fall Risk  Fall Risk Patient at Risk for Falls Due to: No Fall Risks Fall risk Follow up: Falls evaluation completed  Home and Transportation Safety: All rugs have non-skid backing?: yes All stairs or steps have railings?: (!) no  (3 steps up into bedroom doesn't have a rail) Grab bars in the bathtub or shower?: yes Have non-skid surface in bathtub or shower?: yes Good home lighting?: yes Regular seat belt use?: yes Hospital stays in the last year:: no  Cognitive Assessment Difficulty concentrating, remembering, or making decisions? : no Will 6CIT or Mini Cog be Completed: yes What year is it?: 0 points What month is it?: 0 points Give patient an address phrase to remember (5 components): [address] [place]  About what time is it?: 0 points Count backwards from 20 to 1: 0 points Say the months of the year in reverse: 0 points Repeat the address phrase from earlier: 0 points 6 CIT Score: 0 points  Advance Directives (For Healthcare) Does Patient Have a Medical Advance Directive?: Yes Does patient want to make changes to medical advance directive?: No - Guardian declined Type of Advance Directive: Healthcare Power of Attorney Copy of Healthcare Power of Attorney in Chart?: Yes - validated most recent copy scanned in chart (See row information)  Reviewed/Updated  Reviewed/Updated: Reviewed All (Medical, Surgical, Family, Medications, Allergies, Care Teams, Patient Goals)        Objective:    Today's Vitals   07/04/24 1302  Weight: 129 lb (58.5 kg)  Height: 5' 1 (1.549 m)   Body mass index is 24.37 kg/m.  Current Medications (verified) Outpatient Encounter Medications as of 07/04/2024  Medication Sig   ciclopirox  (PENLAC ) 8 % solution Apply topically at bedtime. Apply over nail and surrounding skin. Apply daily over previous coat. After seven (7) days, may remove with alcohol  and continue cycle. (Patient not taking: Reported on 07/04/2024)   Continuous Glucose Sensor (FREESTYLE LIBRE 3 PLUS SENSOR) MISC Inject 1 each into the skin every 14 (fourteen) days.   Insulin  Lispro-aabc (LYUMJEV ) 100 UNIT/ML SOLN Inject up to 35 Units as directed with insulin  pump   levothyroxine  (SYNTHROID ) 175  MCG tablet Take 175 mcg by mouth daily before breakfast.   montelukast  (SINGULAIR ) 10 MG tablet Take 1 tablet (10 mg total) by mouth at bedtime.   rosuvastatin  (CRESTOR ) 20 MG tablet Take 1 tablet (20 mg total) by mouth at bedtime.   Albuterol -Budesonide (AIRSUPRA ) 90-80 MCG/ACT AERO Inhale 2 puffs into the lungs every 6 (six) hours.   glucose blood (FREESTYLE LITE) test strip Check blood sugar 6 times a day (Patient not taking: Reported on 07/04/2024)   [DISCONTINUED] Alpha-Lipoic Acid 300 MG CAPS Take by mouth daily.   [DISCONTINUED] Calcium  Carb-Cholecalciferol (CALTRATE 600+D3) 600-800 MG-UNIT TABS    [DISCONTINUED] Calcium -Magnesium-Vitamin D  (CALCIUM  MAGNESIUM PO) Take 1 tablet by mouth daily.   [DISCONTINUED] fish oil-omega-3 fatty acids 1000 MG capsule Take 2 g by mouth  daily.   No facility-administered encounter medications on file as of 07/04/2024.   Hearing/Vision screen No results found. Immunizations and Health Maintenance Health Maintenance  Topic Date Due   Diabetic kidney evaluation - Urine ACR  Never done   Fecal DNA (Cologuard)  Never done   DTaP/Tdap/Td (2 - Td or Tdap) 11/22/2023   COVID-19 Vaccine (5 - 2025-26 season) 04/17/2024   HEMOGLOBIN A1C  11/01/2024   OPHTHALMOLOGY EXAM  11/16/2024   Diabetic kidney evaluation - eGFR measurement  12/08/2024   FOOT EXAM  02/02/2025   Mammogram  05/22/2025   Medicare Annual Wellness (AWV)  07/04/2025   Pneumococcal Vaccine: 50+ Years  Completed   Influenza Vaccine  Completed   DEXA SCAN  Completed   Hepatitis C Screening  Completed   Zoster Vaccines- Shingrix  Completed   Meningococcal B Vaccine  Aged Out   Colonoscopy  Discontinued    Reports colonoscopy in last 3 yrs with Dr Dianna Gwen); will request records.     Assessment/Plan:  This is a routine wellness examination for Tiffany Browning.  Patient Care Team: Antonio Meth, Jamee SAUNDERS, DO as PCP - General (Family Medicine) Nichole Senior, MD as Consulting Physician  (Endocrinology) Curvin Deward MOULD, MD as Consulting Physician (General Surgery) Izell Domino, MD as Attending Physician (Radiation Oncology) Crawford Morna Pickle, NP as Nurse Practitioner (Hematology and Oncology) Josh Server, MD as Referring Physician (Ophthalmology) Dianna Specking, MD as Consulting Physician (Gastroenterology) Jeffie Haws, OD [facility])  I have personally reviewed and noted the following in the patient's chart:   Medical and social history Use of alcohol , tobacco or illicit drugs  Current medications and supplements including opioid prescriptions. Functional ability and status Nutritional status Physical activity Advanced directives List of other physicians Hospitalizations, surgeries, and ER visits in previous 12 months Vitals Screenings to include cognitive, depression, and falls Referrals and appointments  Orders Placed This Encounter  Procedures   DG Bone Density    Standing Status:   Future    Expected Date:   07/04/2024    Expiration Date:   07/04/2025    Reason for Exam (SYMPTOM  OR DIAGNOSIS REQUIRED):   postmenopausal estrogen deficiency, osteopenia    Preferred imaging location?:   [facility]   Hemoglobin A1c    This external order was created through the Results Console.   In addition, I have reviewed and discussed with patient certain preventive protocols, quality metrics, and best practice recommendations. A written personalized care plan for preventive services as well as general preventive health recommendations were provided to patient.   Lolita Libra, CMA   07/04/2024   Return in 1 year (on 07/04/2025).  After Visit Summary: (MyChart) Due to this being a telephonic visit, the after visit summary with patients personalized plan was offered to patient via MyChart   Nurse Notes: nothing significant to report

## 2024-07-18 ENCOUNTER — Ambulatory Visit: Admitting: Family Medicine

## 2024-07-18 ENCOUNTER — Encounter: Payer: Self-pay | Admitting: Family Medicine

## 2024-07-18 ENCOUNTER — Other Ambulatory Visit: Payer: Self-pay | Admitting: Family Medicine

## 2024-07-18 VITALS — BP 130/68 | HR 64 | Temp 98.4°F | Resp 16 | Ht 61.0 in | Wt 127.6 lb

## 2024-07-18 DIAGNOSIS — E1165 Type 2 diabetes mellitus with hyperglycemia: Secondary | ICD-10-CM | POA: Diagnosis not present

## 2024-07-18 DIAGNOSIS — E039 Hypothyroidism, unspecified: Secondary | ICD-10-CM

## 2024-07-18 DIAGNOSIS — E785 Hyperlipidemia, unspecified: Secondary | ICD-10-CM | POA: Diagnosis not present

## 2024-07-18 DIAGNOSIS — J4520 Mild intermittent asthma, uncomplicated: Secondary | ICD-10-CM | POA: Diagnosis not present

## 2024-07-18 DIAGNOSIS — Z794 Long term (current) use of insulin: Secondary | ICD-10-CM

## 2024-07-18 LAB — COMPREHENSIVE METABOLIC PANEL WITH GFR
ALT: 47 U/L — ABNORMAL HIGH (ref 0–35)
AST: 47 U/L — ABNORMAL HIGH (ref 0–37)
Albumin: 4.4 g/dL (ref 3.5–5.2)
Alkaline Phosphatase: 113 U/L (ref 39–117)
BUN: 9 mg/dL (ref 6–23)
CO2: 28 meq/L (ref 19–32)
Calcium: 9.8 mg/dL (ref 8.4–10.5)
Chloride: 105 meq/L (ref 96–112)
Creatinine, Ser: 0.68 mg/dL (ref 0.40–1.20)
GFR: 90.3 mL/min (ref >60.00)
Glucose, Bld: 151 mg/dL — ABNORMAL HIGH (ref 70–99)
Potassium: 4.2 meq/L (ref 3.5–5.1)
Sodium: 141 meq/L (ref 135–145)
Total Bilirubin: 0.4 mg/dL (ref 0.2–1.2)
Total Protein: 7 g/dL (ref 6.0–8.3)

## 2024-07-18 LAB — CBC WITH DIFFERENTIAL/PLATELET
Basophils Absolute: 0.1 K/uL (ref 0.0–0.1)
Basophils Relative: 1.2 % (ref 0.0–3.0)
Eosinophils Absolute: 0.5 K/uL (ref 0.0–0.7)
Eosinophils Relative: 10.5 % — ABNORMAL HIGH (ref 0.0–5.0)
HCT: 35.5 % — ABNORMAL LOW (ref 36.0–46.0)
Hemoglobin: 12 g/dL (ref 12.0–15.0)
Lymphocytes Relative: 28 % (ref 12.0–46.0)
Lymphs Abs: 1.4 K/uL (ref 0.7–4.0)
MCHC: 33.7 g/dL (ref 30.0–36.0)
MCV: 89.3 fl (ref 78.0–100.0)
Monocytes Absolute: 0.5 K/uL (ref 0.1–1.0)
Monocytes Relative: 10.2 % (ref 3.0–12.0)
Neutro Abs: 2.5 K/uL (ref 1.4–7.7)
Neutrophils Relative %: 50.1 % (ref 43.0–77.0)
Platelets: 341 K/uL (ref 150.0–400.0)
RBC: 3.98 Mil/uL (ref 3.87–5.11)
RDW: 12.3 % (ref 11.5–15.5)
WBC: 5 K/uL (ref 4.0–10.5)

## 2024-07-18 LAB — MICROALBUMIN / CREATININE URINE RATIO
Creatinine,U: 130 mg/dL
Microalb Creat Ratio: 6.6 mg/g (ref 0.0–30.0)
Microalb, Ur: 0.9 mg/dL (ref 0.0–1.9)

## 2024-07-18 LAB — LIPID PANEL
Cholesterol: 135 mg/dL (ref 0–200)
HDL: 49.9 mg/dL (ref >39.00)
LDL Cholesterol: 71 mg/dL (ref 0–99)
NonHDL: 84.83
Total CHOL/HDL Ratio: 3
Triglycerides: 67 mg/dL (ref 0.0–149.0)
VLDL: 13.4 mg/dL (ref 0.0–40.0)

## 2024-07-18 LAB — TSH: TSH: <0.01 u[IU]/mL — ABNORMAL LOW (ref 0.35–5.50)

## 2024-07-18 MED ORDER — ROSUVASTATIN CALCIUM 20 MG PO TABS: 20.0000 mg | ORAL_TABLET | Freq: Every day | ORAL | 1 refills | Status: AC

## 2024-07-18 MED ORDER — ALBUTEROL SULFATE HFA 108 (90 BASE) MCG/ACT IN AERS: 2.0000 | INHALATION_SPRAY | Freq: Four times a day (QID) | RESPIRATORY_TRACT | Status: AC | PRN

## 2024-07-18 NOTE — Assessment & Plan Note (Signed)
 Stable

## 2024-07-18 NOTE — Assessment & Plan Note (Signed)
Check labs  On synthroid 

## 2024-07-18 NOTE — Progress Notes (Signed)
 --------------  Subjective:    Patient ID: Tiffany Browning, female    DOB: Jan 08, 1956, 68 y.o.   MRN: [id number]  Chief Complaint  Patient presents with   Hyperlipidemia   Follow-up    HPI Patient is in today for f/u chol.  Discussed the use of AI scribe software for clinical note transcription with the patient, who gave verbal consent to proceed.  History of Present Illness Tiffany Browning is a 68 year old female who presents for medication management and follow-up.  She is currently using a brush-on antifungal treatment for toenail fungus, which is showing positive results as her toenail is growing in well. She also uses an over-the-counter antifungal and plans to continue with pedicures while avoiding nail polish.  Her current medications include Crestor , Singulair , and levothyroxine , with the latter at a dosage of 175 mcg. She has experienced a weight loss of two pounds over the past month and is pleased with this progress.  She has changed her insurance from [employer] to [employer], effective January 1st, which impacts her inhaler prescription. She cannot continue with Airsupra  due to coverage issues and plans to switch to albuterol . She has not yet finished her current canister of Airsupra .  She had a colonoscopy in either 2021 or 2022 by Dr. Dianna at [facility], which showed no polyps. She received a Tdap vaccine on November 14th at [facility].  Her recent A1c is 6.9%, improved from a previous 7.5% following a cortisone shot for her trigger finger. She manages her insulin  pump settings with her endocrinologist.    Past Medical History:  Diagnosis Date   Allergy    Asthma    under control   Breast cancer (HCC)    Diabetes mellitus    Type 1-on insulin  pump   Family history of breast cancer    Family history of kidney cancer    Heart murmur    Hypothyroidism    Personal history of radiation therapy    Thyroid  disease    Hypothyroidism    Past  Surgical History:  Procedure Laterality Date   ABDOMINAL HYSTERECTOMY     APPENDECTOMY     BREAST BIOPSY Right 02/14/2016   BREAST BIOPSY Right 01/09/2016   BREAST LUMPECTOMY Right 03/26/2016   BREAST LUMPECTOMY WITH AXILLARY LYMPH NODE BIOPSY Right 03/26/2016   Procedure: RIGHT BREAST LUMPECTOMY WITH AXILLARY LYMPH NODE BIOPSY;  Surgeon: Deward Null III, MD;  Location: [facility];  Service: General;  Laterality: Right;   CARPAL TUNNEL RELEASE Right 02/09/2019   Procedure: RIGHT CARPAL TUNNEL RELEASE;  Surgeon: Murrell Kuba, MD;  Location: [facility];  Service: Orthopedics;  Laterality: Right;   CARPAL TUNNEL RELEASE Left 02/05/2022   Procedure: CARPAL TUNNEL RELEASE;  Surgeon: Murrell Drivers, MD;  Location: [facility];  Service: Orthopedics;  Laterality: Left;  30 MIN   CATARACT EXTRACTION, BILATERAL  2023   NASAL SINUS SURGERY     ruptured ovarian cyst     SHOULDER ARTHROSCOPY  2004   b/L --second one 2011   TONSILECTOMY, ADENOIDECTOMY, BILATERAL MYRINGOTOMY AND TUBES     TRIGGER FINGER RELEASE Right 07/04/2020   Procedure: RELEASE TRIGGER FINGER/A-1 PULLEY;  Surgeon: Murrell Kuba, MD;  Location: [facility];  Service: Orthopedics;  Laterality: Right;  IV REGIONAL FOREARM BLOCK   TUBAL LIGATION     then reversed it    Family History  Problem Relation Age of Onset   Cancer Mother  cervical   Hypertension Mother    Hyperlipidemia Mother    Arthritis Mother    Heart disease Father 11       MI---- cabg before that   Alcohol  abuse Father    Hypothyroidism Sister    Breast cancer Sister 26   Cancer Sister    Heart disease Brother 27       MI--- second one 26   Hyperlipidemia Brother    Hypertension Brother    Hypothyroidism Sister    Kidney cancer Other 56       son of her sister with breast cancer    Social History   Socioeconomic History   Marital status: Married    Spouse name: Tommy   Number of children: 0   Years of  education: Not on file   Highest education level: Associate degree: occupational, scientist, product/process development, or vocational program  Occupational History   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  Tobacco Use   Smoking status: Never   Smokeless tobacco: Never  Vaping Use   Vaping status: Never Used  Substance and Sexual Activity   Alcohol  use: Yes    Comment: occ   Drug use: No   Sexual activity: Yes    Partners: Male  Other Topics Concern   Not on file  Social History Narrative   Not on file   Social Drivers of Health   Financial Resource Strain: Low Risk  (06/15/2024)   Overall Financial Resource Strain (CARDIA)    Difficulty of Paying Living Expenses: Not hard at all  Food Insecurity: No Food Insecurity (07/04/2024)   Hunger Vital Sign    Worried About Running Out of Food in the Last Year: Never true    Ran Out of Food in the Last Year: Never true  Transportation Needs: No Transportation Needs (07/04/2024)   PRAPARE - Administrator, Civil Service (Medical): No    Lack of Transportation (Non-Medical): No  Physical Activity: Sufficiently Active (07/04/2024)   Exercise Vital Sign    Days of Exercise per Week: 4 days    Minutes of Exercise per Session: 60 min  Stress: No Stress Concern Present (07/04/2024)   [employer] of Occupational Health - Occupational Stress Questionnaire    Feeling of Stress: Only a little  Social Connections: Socially Integrated (07/04/2024)   Social Connection and Isolation Panel    Frequency of Communication with Friends and Family: Once a week    Frequency of Social Gatherings with Friends and Family: Three times a week    Attends Religious Services: More than 4 times per year    Active Member of Clubs or Organizations: Yes    Attends Banker Meetings: More than 4 times per year    Marital Status: Married  Catering Manager Violence: Not At Risk (07/04/2024)   Humiliation, Afraid, Rape, and Kick questionnaire    Fear  of Current or Ex-Partner: No    Emotionally Abused: No    Physically Abused: No    Sexually Abused: No    Outpatient Medications Prior to Visit  Medication Sig Dispense Refill   CHROMIUM PO Take by mouth.     Continuous Glucose Sensor (FREESTYLE LIBRE 3 PLUS SENSOR) MISC Inject 1 each into the skin every 14 (fourteen) days.     DHA-EPA-Vit B6-B12-Folic Acid (CARDIOVID PLUS PO) Take by mouth.     Insulin  Lispro-aabc (LYUMJEV ) 100 UNIT/ML SOLN Inject up to 35 Units as directed with insulin  pump 40 mL 3   levothyroxine  (  SYNTHROID ) 175 MCG tablet Take 175 mcg by mouth daily before breakfast.     montelukast  (SINGULAIR ) 10 MG tablet Take 1 tablet (10 mg total) by mouth at bedtime. 90 tablet 0   UNABLE TO FIND A-F Betafood     UNABLE TO FIND Albaplex     UNABLE TO FIND Paraplex     UNABLE TO FIND Ribonucleic Acid     UNABLE TO FIND Tuna Omega 3 oil     rosuvastatin  (CRESTOR ) 20 MG tablet Take 1 tablet (20 mg total) by mouth at bedtime. 90 tablet 0   Albuterol -Budesonide (AIRSUPRA ) 90-80 MCG/ACT AERO Inhale 2 puffs into the lungs every 6 (six) hours. 10.7 g 3   ciclopirox  (PENLAC ) 8 % solution Apply topically at bedtime. Apply over nail and surrounding skin. Apply daily over previous coat. After seven (7) days, may remove with alcohol  and continue cycle. (Patient not taking: Reported on 07/18/2024) 6.6 mL 0   glucose blood (FREESTYLE LITE) test strip Check blood sugar 6 times a day (Patient not taking: Reported on 07/18/2024) 600 each 3   No facility-administered medications prior to visit.    No Known Allergies  Review of Systems  Constitutional:  Negative for fever and malaise/fatigue.  HENT:  Negative for congestion.   Eyes:  Negative for blurred vision.  Respiratory:  Negative for shortness of breath.   Cardiovascular:  Negative for chest pain, palpitations and leg swelling.  Gastrointestinal:  Negative for abdominal pain, blood in stool and nausea.  Genitourinary:  Negative for  dysuria and frequency.  Musculoskeletal:  Negative for falls.  Skin:  Negative for rash.  Neurological:  Negative for dizziness, loss of consciousness and headaches.  Endo/Heme/Allergies:  Negative for environmental allergies.  Psychiatric/Behavioral:  Negative for depression. The patient is not nervous/anxious.        Objective:    Physical Exam Vitals and nursing note reviewed.  Constitutional:      General: She is not in acute distress.    Appearance: Normal appearance. She is well-developed.  HENT:     Head: Normocephalic and atraumatic.  Eyes:     General: No scleral icterus.       Right eye: No discharge.        Left eye: No discharge.  Cardiovascular:     Rate and Rhythm: Normal rate and regular rhythm.     Heart sounds: No murmur heard. Pulmonary:     Effort: Pulmonary effort is normal. No respiratory distress.     Breath sounds: Normal breath sounds.  Musculoskeletal:        General: Normal range of motion.     Cervical back: Normal range of motion and neck supple.     Right lower leg: No edema.     Left lower leg: No edema.  Skin:    General: Skin is warm and dry.  Neurological:     Mental Status: She is alert and oriented to person, place, and time.  Psychiatric:        Mood and Affect: Mood normal.        Behavior: Behavior normal.        Thought Content: Thought content normal.        Judgment: Judgment normal.     BP 130/68 (BP Location: Left Arm, Patient Position: Sitting, Cuff Size: Normal)   Pulse 64   Temp 98.4 F (36.9 C) (Oral)   Resp 16   Ht 5' 1 (1.549 m)   Wt 127 lb 9.6 oz (  57.9 kg)   SpO2 95%   BMI 24.11 kg/m  Wt Readings from Last 3 Encounters:  07/18/24 127 lb 9.6 oz (57.9 kg)  07/04/24 129 lb (58.5 kg)  02/17/24 129 lb 6.4 oz (58.7 kg)    Diabetic Foot Exam - Simple   No data filed    Lab Results  Component Value Date   WBC 4.4 12/09/2023   HGB 11.4 (L) 12/09/2023   HCT 34.9 (L) 12/09/2023   PLT 348.0 12/09/2023    GLUCOSE 109 (H) 12/09/2023   CHOL 171 12/09/2023   TRIG 102.0 12/09/2023   HDL 51.10 12/09/2023   LDLCALC 100 (H) 12/09/2023   ALT 29 12/09/2023   AST 28 12/09/2023   NA 140 12/09/2023   K 4.4 12/09/2023   CL 104 12/09/2023   CREATININE 0.82 12/09/2023   BUN 11 12/09/2023   CO2 28 12/09/2023   TSH 0.96 12/09/2023   HGBA1C 7.5 05/04/2024   MICROALBUR 12 06/11/2015    Lab Results  Component Value Date   TSH 0.96 12/09/2023   Lab Results  Component Value Date   WBC 4.4 12/09/2023   HGB 11.4 (L) 12/09/2023   HCT 34.9 (L) 12/09/2023   MCV 92.2 12/09/2023   PLT 348.0 12/09/2023   Lab Results  Component Value Date   NA 140 12/09/2023   K 4.4 12/09/2023   CHLORIDE 106 12/15/2016   CO2 28 12/09/2023   GLUCOSE 109 (H) 12/09/2023   BUN 11 12/09/2023   CREATININE 0.82 12/09/2023   BILITOT 0.3 12/09/2023   ALKPHOS 110 12/09/2023   AST 28 12/09/2023   ALT 29 12/09/2023   PROT 7.0 12/09/2023   ALBUMIN 4.4 12/09/2023   CALCIUM  9.3 12/09/2023   ANIONGAP 8 02/02/2022   EGFR 82 (L) 12/15/2016   GFR 74.48 12/09/2023   Lab Results  Component Value Date   CHOL 171 12/09/2023   Lab Results  Component Value Date   HDL 51.10 12/09/2023   Lab Results  Component Value Date   LDLCALC 100 (H) 12/09/2023   Lab Results  Component Value Date   TRIG 102.0 12/09/2023   Lab Results  Component Value Date   CHOLHDL 3 12/09/2023   Lab Results  Component Value Date   HGBA1C 7.5 05/04/2024       Assessment & Plan:  Hyperlipidemia, unspecified hyperlipidemia type Assessment & Plan: Encourage heart healthy diet such as MIND or DASH diet, increase exercise, avoid trans fats, simple carbohydrates and processed foods, consider a krill or fish or flaxseed oil cap daily.    Orders: -     Lipid panel -     CBC with Differential/Platelet -     Comprehensive metabolic panel with GFR -     Rosuvastatin  Calcium ; Take 1 tablet (20 mg total) by mouth at bedtime.  Dispense: 90 tablet;  Refill: 1  Mild intermittent asthma, unspecified whether complicated Assessment & Plan: Stable   Orders: -     Albuterol  Sulfate HFA; Inhale 2 puffs into the lungs every 6 (six) hours as needed for wheezing or shortness of breath.  Type 2 diabetes mellitus with hyperglycemia, without long-term current use of insulin  (HCC) -     Comprehensive metabolic panel with GFR -     Microalbumin / creatinine urine ratio  Hypothyroidism, unspecified type Assessment & Plan: Check labs  On synthroid    Orders: -     TSH   Assessment and Plan Assessment & Plan Onychomycosis   Improving with topical antifungal  treatment. She reports good toenail growth and will continue using both prescribed and over-the-counter antifungal products. Continue current topical antifungal regimen.  Type 2 diabetes mellitus with hyperglycemia   Recent A1c was 7.5, likely elevated due to a cortisone injection for trigger finger. Follow-up A1c improved to 6.9 after adjusting insulin  pump basal rate. She plans to delay trigger finger surgery until after the first quarter of next year. Continue current diabetes management, monitor blood glucose regularly, and plan for potential surgery after the first quarter.  Hyperlipidemia   Managed with Crestor . She understands the importance of maintaining cholesterol levels due to her stroke history. Transitioning from [employer] to [place] will affect medication coverage. Continue Crestor  and ensure coverage with [employer].  Hypothyroidism   Managed with levothyroxine . Transitioning from [employer] to [employer] will affect medication coverage. Continue levothyroxine  and ensure coverage with [employer].  Mild intermittent asthma   Managed with Airsupra  inhaler. Transitioning from [employer] to [place] will affect medication coverage. She plans to switch to albuterol  inhaler due to insurance coverage issues. Switched inhaler prescription to albuterol  and ensure coverage  with [employer].  General Health Maintenance   Up to date with colonoscopy, last performed in 2021 or 2022. Received tdap vaccination on November 14th, 2024. Due for a tetanus booster but recently received tdap. Continue routine health maintenance screenings as scheduled.   Gaelle Adriance R Lowne Chase, DO

## 2024-07-18 NOTE — Assessment & Plan Note (Signed)
 Encourage heart healthy diet such as MIND or DASH diet, increase exercise, avoid trans fats, simple carbohydrates and processed foods, consider a krill or fish or flaxseed oil cap daily.

## 2024-07-21 ENCOUNTER — Ambulatory Visit: Payer: Self-pay | Admitting: Family

## 2024-07-21 ENCOUNTER — Other Ambulatory Visit: Payer: Self-pay | Admitting: Family

## 2024-07-21 DIAGNOSIS — E039 Hypothyroidism, unspecified: Secondary | ICD-10-CM

## 2024-07-21 MED ORDER — LEVOTHYROXINE SODIUM 150 MCG PO TABS: 150.0000 ug | ORAL_TABLET | Freq: Every day | ORAL | 3 refills | Status: DC

## 2024-07-30 ENCOUNTER — Other Ambulatory Visit: Payer: Self-pay | Admitting: Family Medicine

## 2024-07-30 DIAGNOSIS — J4520 Mild intermittent asthma, uncomplicated: Secondary | ICD-10-CM

## 2024-07-31 LAB — HM COLONOSCOPY

## 2024-08-25 ENCOUNTER — Other Ambulatory Visit: Payer: Self-pay | Admitting: Orthopedic Surgery

## 2024-09-05 ENCOUNTER — Other Ambulatory Visit (INDEPENDENT_AMBULATORY_CARE_PROVIDER_SITE_OTHER)

## 2024-09-05 DIAGNOSIS — E039 Hypothyroidism, unspecified: Secondary | ICD-10-CM

## 2024-09-05 LAB — TSH: TSH: <0.01 u[IU]/mL — ABNORMAL LOW (ref 0.35–5.50)

## 2024-09-06 ENCOUNTER — Other Ambulatory Visit: Payer: Self-pay | Admitting: Family Medicine

## 2024-09-06 ENCOUNTER — Ambulatory Visit: Payer: Self-pay | Admitting: Family Medicine

## 2024-09-06 DIAGNOSIS — E039 Hypothyroidism, unspecified: Secondary | ICD-10-CM

## 2024-09-07 ENCOUNTER — Telehealth: Payer: Self-pay

## 2024-09-07 MED ORDER — LEVOTHYROXINE SODIUM 137 MCG PO TABS: 137.0000 ug | ORAL_TABLET | Freq: Every day | ORAL | 0 refills | Status: AC

## 2024-09-07 NOTE — Telephone Encounter (Signed)
 Spoke w/ Pt -made her aware of lab results and recommendations. Rx sent to [facility] as requested. Lab appt scheduled.

## 2024-09-07 NOTE — Telephone Encounter (Signed)
 Copied from CRM #[id number]. Topic: Clinical - Medication Refill >> Sep 06, 2024  5:12 PM Alfonso ORN wrote: Medication:  levothyroxine  (SYNTHROID ) 150 MCG tablet  Has the patient contacted their pharmacy? Yes  This is the patient's preferred pharmacy:    [facility] [address] [place] [address] Phone: [phone] Fax: [phone]  Is this the correct pharmacy for this prescription? Yes If no, delete pharmacy and type the correct one.   Has the prescription been filled recently? No  Is the patient out of the medication? Yes  Has the patient been seen for an appointment in the last year OR does the patient have an upcoming appointment? Yes  Can we respond through MyChart? No  Pharm tech from [facility] (mail order) stated a request was faxed 01/07 for  levothyroxine  (SYNTHROID ) 150 MCG tablet. Confirmed fax number

## 2024-09-07 NOTE — Telephone Encounter (Signed)
 Hyperthyroid--- lower synthroid  to 137 mcg #30 1 po every day , 2 refills  Recheck 2 months   Written by Jamee JONELLE Antonio Cyndee, DO on 09/06/2024 10:24 AM EST

## 2024-09-21 ENCOUNTER — Telehealth: Payer: Self-pay

## 2024-09-21 DIAGNOSIS — J4520 Mild intermittent asthma, uncomplicated: Secondary | ICD-10-CM

## 2024-09-21 MED ORDER — MONTELUKAST SODIUM 10 MG PO TABS: 10.0000 mg | ORAL_TABLET | Freq: Every day | ORAL | 1 refills | Status: AC

## 2024-09-21 NOTE — Telephone Encounter (Signed)
 Tiffany Browning F   09/21/2024  2:43 PM  The medication is for montelukast  (Singulair ) 10 mg tablet, the agent had the correct medication at first but did not realize that she copy pasted the dept name, agent will be advised to double check the information is put in correctly

## 2024-09-21 NOTE — Telephone Encounter (Signed)
 Copied from CRM [id number]. Topic: Clinical - Medication Refill >> Sep 21, 2024  1:26 PM Roselie BROCKS wrote: Medication: [facility]  Has the patient contacted their pharmacy? Yes (Agent: If no, request that the patient contact the pharmacy for the refill. If patient does not wish to contact the pharmacy document the reason why and proceed with request.) (Agent: If yes, when and what did the pharmacy advise?)  This is the patient's preferred pharmacy:  [facility] - [place], [place] - [address] [place] [address] Phone: [phone] Fax: [phone]  Is this the correct pharmacy for this prescription? Yes If no, delete pharmacy and type the correct one.   Has the prescription been filled recently? Yes  Is the patient out of the medication? No  Has the patient been seen for an appointment in the last year OR does the patient have an upcoming appointment? Yes  Can we respond through MyChart? Yes  Agent: Please be advised that Rx refills may take up to 3 business days. We ask that you follow-up with your pharmacy.

## 2024-09-21 NOTE — Telephone Encounter (Signed)
 Rx sent.

## 2024-09-21 NOTE — Telephone Encounter (Signed)
 Noted.

## 2024-09-21 NOTE — Telephone Encounter (Signed)
 Error CRM sent. Name of medication(s) needed not given. Awaiting information.

## 2024-10-12 ENCOUNTER — Encounter (HOSPITAL_BASED_OUTPATIENT_CLINIC_OR_DEPARTMENT_OTHER): Payer: Self-pay

## 2024-10-12 ENCOUNTER — Ambulatory Visit (HOSPITAL_BASED_OUTPATIENT_CLINIC_OR_DEPARTMENT_OTHER): Admit: 2024-10-12 | Admitting: Orthopedic Surgery

## 2024-11-07 ENCOUNTER — Other Ambulatory Visit

## 2025-07-05 ENCOUNTER — Ambulatory Visit
